# Patient Record
Sex: Male | Born: 1945 | Race: White | Hispanic: No | Marital: Married | Smoking: Former smoker
Health system: Southern US, Community
[De-identification: ages and names within clinical notes are randomized; demographics above are authoritative.]

## PROBLEM LIST (undated history)

## (undated) HISTORY — PX: COLONOSCOPY: SHX174

## (undated) HISTORY — PX: POLYPECTOMY: SHX149

---

## 1997-11-20 ENCOUNTER — Encounter: Admission: RE | Admit: 1997-11-20 | Discharge: 1997-11-20 | Payer: Self-pay | Admitting: Family Medicine

## 1998-01-07 ENCOUNTER — Encounter: Admission: RE | Admit: 1998-01-07 | Discharge: 1998-01-07 | Payer: Self-pay | Admitting: Family Medicine

## 1998-01-24 ENCOUNTER — Encounter: Payer: Self-pay | Admitting: Sports Medicine

## 1998-01-24 ENCOUNTER — Ambulatory Visit (HOSPITAL_COMMUNITY): Admission: RE | Admit: 1998-01-24 | Discharge: 1998-01-24 | Payer: Self-pay | Admitting: Sports Medicine

## 1998-11-11 ENCOUNTER — Encounter: Admission: RE | Admit: 1998-11-11 | Discharge: 1998-11-11 | Payer: Self-pay | Admitting: Sports Medicine

## 1999-10-13 ENCOUNTER — Encounter: Admission: RE | Admit: 1999-10-13 | Discharge: 1999-10-13 | Payer: Self-pay | Admitting: Sports Medicine

## 2000-07-05 ENCOUNTER — Encounter: Admission: RE | Admit: 2000-07-05 | Discharge: 2000-07-05 | Payer: Self-pay | Admitting: Sports Medicine

## 2000-09-13 ENCOUNTER — Ambulatory Visit (HOSPITAL_COMMUNITY): Admission: RE | Admit: 2000-09-13 | Discharge: 2000-09-13 | Payer: Self-pay | Admitting: *Deleted

## 2001-06-07 ENCOUNTER — Encounter: Admission: RE | Admit: 2001-06-07 | Discharge: 2001-06-07 | Payer: Self-pay | Admitting: Sports Medicine

## 2002-03-24 ENCOUNTER — Encounter: Admission: RE | Admit: 2002-03-24 | Discharge: 2002-03-24 | Payer: Self-pay | Admitting: Sports Medicine

## 2002-10-31 ENCOUNTER — Encounter: Admission: RE | Admit: 2002-10-31 | Discharge: 2002-10-31 | Payer: Self-pay | Admitting: Family Medicine

## 2003-07-17 ENCOUNTER — Encounter: Admission: RE | Admit: 2003-07-17 | Discharge: 2003-07-17 | Payer: Self-pay | Admitting: Family Medicine

## 2003-07-24 ENCOUNTER — Encounter: Admission: RE | Admit: 2003-07-24 | Discharge: 2003-07-24 | Payer: Self-pay | Admitting: Family Medicine

## 2005-04-28 ENCOUNTER — Ambulatory Visit: Payer: Self-pay | Admitting: Sports Medicine

## 2008-10-18 ENCOUNTER — Ambulatory Visit: Payer: Self-pay | Admitting: Sports Medicine

## 2008-10-18 DIAGNOSIS — M545 Low back pain, unspecified: Secondary | ICD-10-CM | POA: Insufficient documentation

## 2010-07-22 ENCOUNTER — Ambulatory Visit: Payer: Self-pay | Admitting: Sports Medicine

## 2011-11-24 ENCOUNTER — Ambulatory Visit (INDEPENDENT_AMBULATORY_CARE_PROVIDER_SITE_OTHER): Payer: Medicare Other | Admitting: Sports Medicine

## 2011-11-24 VITALS — BP 132/75 | Ht 70.0 in | Wt 160.0 lb

## 2011-11-24 DIAGNOSIS — M7700 Medial epicondylitis, unspecified elbow: Secondary | ICD-10-CM

## 2011-11-24 DIAGNOSIS — M25529 Pain in unspecified elbow: Secondary | ICD-10-CM | POA: Insufficient documentation

## 2011-11-24 DIAGNOSIS — M7711 Lateral epicondylitis, right elbow: Secondary | ICD-10-CM | POA: Insufficient documentation

## 2011-11-24 MED ORDER — MELOXICAM 15 MG PO TABS: 15.0000 mg | ORAL_TABLET | Freq: Every day | ORAL | Status: AC

## 2011-11-24 NOTE — Assessment & Plan Note (Signed)
Right side is moderate, Left is minimal.  Home exercise program prescribed and Ice massage.  Patient has tramadol at home to use as needed for pain.  He is having a colonoscopy next week- he understands not to take NSAIDS until his GI doctor approves- RX for meloxicam to start after colonoscopy when gastroenterologist feels it is safe.  Continue elbow straps. Follow up in 4 weeks if not improved.

## 2011-11-24 NOTE — Patient Instructions (Addendum)
Keep wearing your elbow brace.   Please try Ice massage over the area on your elbow that is sore.   Reverse wrist Curls: 5 lb weights, up slow, down fast, 3 sets of 10 Wrist Supination: 3-5 lb weights over fast, back slow 3 sets of 10 Biceps curls: Normal up, slow negative 15 lb weights, 3 sets of 10.  Triceps: max 10 lb weight, one arm at a time, 3 sets of 10

## 2011-11-24 NOTE — Progress Notes (Signed)
  Subjective:    Patient ID: Jason Stephenson, male    DOB: [birth year], 66 y.o.   MRN: [id number]  HPI  Jason Stephenson comes in for bilateral elbow pain.  He has been exercising, and was doing overhead triceps weights, with both hands, and moved up to 45 lbs.  He says that exercise was hard, but not painful.  He was also doing other exercises with cables that caused him some discomfort in his elbows.  He says he played golf, about 3 rounds in 10 days, and he started having sharp pain in his right>left elbows.  He had a medial epicondylitis years ago and had an elbow brace, and he got another and has been wearing them on both elbows.   Review of Systems See HPI    Objective:   Physical Exam BP 132/75  Ht 5\' 10"  (1.778 m)  Wt 160 lb (72.576 kg)  BMI 22.96 kg/m2 General appearance: alert, cooperative and no distress Elbow: Patient has tenderness to palpation on R>L medial epicondyle. Extensor tendon strength in tact without pain Flexor tendon strength is in tact, but patient has pain with strength testing R>L Triceps strength is 5/5 bilaterally  MSK ultrasound elbows There is a small amount of spurring at the insertion of the flexor tendons into the medial epicondyle of the right arm There is also some hypoechoic change No abnormal Doppler flow Left medial epicondyle there is some slight hypoechoic change but no tears of the tendon      Assessment & Plan:

## 2011-11-24 NOTE — Assessment & Plan Note (Signed)
Bilateral.  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

## 2011-12-01 ENCOUNTER — Ambulatory Visit: Payer: Self-pay | Admitting: Sports Medicine

## 2011-12-02 ENCOUNTER — Other Ambulatory Visit: Payer: Self-pay | Admitting: *Deleted

## 2011-12-02 DIAGNOSIS — Z8249 Family history of ischemic heart disease and other diseases of the circulatory system: Secondary | ICD-10-CM

## 2012-01-01 ENCOUNTER — Ambulatory Visit (HOSPITAL_COMMUNITY)
Admission: RE | Admit: 2012-01-01 | Discharge: 2012-01-01 | Disposition: A | Discharge status: Home / Self Care | Payer: Medicare Other | Source: Ambulatory Visit | Attending: Sports Medicine | Admitting: Sports Medicine

## 2012-01-01 ENCOUNTER — Encounter: Payer: Medicare Other | Admitting: Sports Medicine

## 2012-01-01 DIAGNOSIS — M549 Dorsalgia, unspecified: Secondary | ICD-10-CM | POA: Insufficient documentation

## 2012-01-01 DIAGNOSIS — I2510 Atherosclerotic heart disease of native coronary artery without angina pectoris: Secondary | ICD-10-CM | POA: Insufficient documentation

## 2012-01-01 DIAGNOSIS — Z8249 Family history of ischemic heart disease and other diseases of the circulatory system: Secondary | ICD-10-CM

## 2014-01-23 ENCOUNTER — Encounter: Payer: Self-pay | Admitting: Sports Medicine

## 2014-01-23 ENCOUNTER — Ambulatory Visit (INDEPENDENT_AMBULATORY_CARE_PROVIDER_SITE_OTHER): Payer: Medicare HMO | Admitting: Sports Medicine

## 2014-01-23 VITALS — BP 153/87 | HR 47 | Ht 69.0 in | Wt 149.0 lb

## 2014-01-23 DIAGNOSIS — S7000XA Contusion of unspecified hip, initial encounter: Secondary | ICD-10-CM

## 2014-01-23 DIAGNOSIS — M7062 Trochanteric bursitis, left hip: Secondary | ICD-10-CM

## 2014-01-23 DIAGNOSIS — S7002XA Contusion of left hip, initial encounter: Secondary | ICD-10-CM

## 2014-01-23 DIAGNOSIS — M76899 Other specified enthesopathies of unspecified lower limb, excluding foot: Secondary | ICD-10-CM | POA: Diagnosis not present

## 2014-01-23 MED ORDER — TRIAMCINOLONE ACETONIDE 10 MG/ML IJ SUSP
20.0000 mg | Freq: Once | INTRAMUSCULAR | Status: AC
  Administered 2014-01-23: 20 mg via INTRA_ARTICULAR

## 2014-01-23 NOTE — Patient Instructions (Signed)
Twice daily use ice pack and put pressure on this for 10 minutes  Use a pad over the hematoma and put on bike shorts Start lateral leg lifts - abduction - build up to 3 x 30 and then add weights Build weights at 3 sets of 15 start with 2 lbs When easy increase the weight  You have an organized hematoma as we saw on scan This won't aspirate as it is like rubber  Try this approach as it is an alternative to surgery  See me in 6 weeks

## 2014-01-23 NOTE — Assessment & Plan Note (Signed)
Procedure note Procedure:  Injection of left greater trochanteric bursa Consent obtained and verified. Time-out conducted. Noted no overlying erythema, induration, or other signs of local infection. Skin prepped in a sterile fashion. Topical analgesic spray: Ethyl chloride. Than  local skin wheal with 1% lidocaine 2 cc Completed without difficulty.  A 21-gauge needle was then used to puncture through the bursa which is thickened and fibrotic and is difficult to penetrate There is no free-flowing material for aspiration but we were able to inject with 2 cc of Kenalog 10 and 3 cc of lidocaine Meds: Pain immediately improved suggesting accurate placement of the medication. Advised to call if fevers/chills, erythema, induration, drainage, or persistent bleeding.   I advised him in terms of using Ice 2x daily and compression along with abduction exercises to see if we can get this consolidated hematoma to break up and resorb  Return in 6 weeks
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
## 2014-01-26 ENCOUNTER — Telehealth: Payer: Self-pay | Admitting: *Deleted

## 2014-01-26 NOTE — Telephone Encounter (Signed)
Message copied by Laurey Arrow on Fri Jan 26, 2014  3:14 PM ------      Message from: Carolyne Littles      Created: Fri Jan 26, 2014 11:34 AM      Regarding: questions      Contact: [phone]       Pt has questions regarding the patch that Dr. Oneida Alar put on his hip. Wants to make sure he is using correctly. ------

## 2014-03-06 ENCOUNTER — Encounter: Payer: Self-pay | Admitting: Sports Medicine

## 2014-03-06 ENCOUNTER — Ambulatory Visit (INDEPENDENT_AMBULATORY_CARE_PROVIDER_SITE_OTHER): Payer: Medicare HMO | Admitting: Sports Medicine

## 2014-03-06 VITALS — BP 132/78 | HR 53 | Ht 69.0 in | Wt 149.0 lb

## 2014-03-06 DIAGNOSIS — M7062 Trochanteric bursitis, left hip: Secondary | ICD-10-CM

## 2014-03-06 NOTE — Assessment & Plan Note (Addendum)
Seroma much improved from previous exam/visit and is much improved on [place]. - No needs for current intervention - Continue with hip abduction exercises  Use compression pants for any riding or running - F/U 36mos if not completley resolved
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
## 2014-03-06 NOTE — Patient Instructions (Addendum)
Please continue to perform the leg exercises including the hip raises on your side about 3 x per day. As well please start using the foam roller or foam ball 3-4 times per day as tolerates. We will see you back in 3 months  Thanks, Dr. Laurell Roof. Awanda Mink

## 2014-06-19 ENCOUNTER — Telehealth: Payer: Self-pay | Admitting: *Deleted

## 2014-06-19 NOTE — Telephone Encounter (Signed)
Pt said he has some meloxicam and wanted to know if this would be ok to take.  Told him it should be fine and we can see how it helps his pain, his appt is next week so we can follow up then.

## 2014-06-19 NOTE — Telephone Encounter (Signed)
-----   Message from Carolyne Littles sent at 06/19/2014 10:19 AM EST ----- Regarding: phone message Contact: [phone] Pt left message asking if he can take meloxicam for tendonitis of his elbow?

## 2014-06-28 ENCOUNTER — Other Ambulatory Visit: Payer: Self-pay | Admitting: Sports Medicine

## 2014-06-28 ENCOUNTER — Ambulatory Visit (INDEPENDENT_AMBULATORY_CARE_PROVIDER_SITE_OTHER): Payer: Medicare HMO | Admitting: Sports Medicine

## 2014-06-28 ENCOUNTER — Encounter: Payer: Self-pay | Admitting: Sports Medicine

## 2014-06-28 VITALS — BP 138/77 | HR 52 | Ht 69.0 in | Wt 160.0 lb

## 2014-06-28 DIAGNOSIS — M25521 Pain in right elbow: Secondary | ICD-10-CM

## 2014-06-28 DIAGNOSIS — M7712 Lateral epicondylitis, left elbow: Secondary | ICD-10-CM | POA: Insufficient documentation

## 2014-06-28 DIAGNOSIS — M7711 Lateral epicondylitis, right elbow: Secondary | ICD-10-CM | POA: Diagnosis not present

## 2014-06-28 DIAGNOSIS — M7062 Trochanteric bursitis, left hip: Secondary | ICD-10-CM

## 2014-06-28 NOTE — Assessment & Plan Note (Signed)
Right lateral epicondylitis - Discussed rehab exercises: Ball squeezes, wrist flexion and extension exercises, wearing brace while playing

## 2014-06-28 NOTE — Assessment & Plan Note (Signed)
Given HEP  Cont using tennis elbow strap  Reck and scan if not better

## 2014-06-28 NOTE — Progress Notes (Signed)
Subjective:   CC: Follow up left greater trochanter hematoma, right lateral elbow pain  HPI:   Follow up left hip hematoma above greater trochanter Present since fall in third week of April. Has been improving greatly since then. He has been doing hip abduction exercises. He has not had much tenderness at this area unless he bumps into something. He is icing it. He runs some and has been going hard on bike spinning machines at gym.   Right elbow pain Lateral elbow pain. Patient noticed it when he started shooting more basketball free throws.  Now hurts with golf  Review of Systems - Per HPI.   PMH: medial epicondylitis, low back pain, greater trochanteric bursitis left hip, elbow pain    Objective:  Physical Exam BP 138/77 mmHg  Pulse 52  Ht 5\' 9"  (1.753 m)  Wt 160 lb (72.576 kg)  BMI 23.62 kg/m2 GEN: NAD MSK:  Left hip full ROM Left hip abduction 5/5 Left greater trochanter with firm fullness that is nontender Mild healing scarring  R elbow TTP at tip lf lat epidcondyle Full ROM No swelling  MSK [place]:  3x1.5cm hypoechoic pocket with likely small clot in center, just above tensor fascia lata attachment to greater trochanter     Assessment:     Jason Stephenson is a 69 y.o. male here to follow up on left hip hematoma above greater trochanter.    Plan:     # See problem list and after visit summary for problem-specific plans.    Follow-up: Follow up PRN.   Hilton Sinclair, MD [facility]    Agree with assessment.  Edited.  Ila Mcgill, MD

## 2014-06-28 NOTE — Assessment & Plan Note (Addendum)
Hematoma is above left greater trochanter just above insertion of left tensor fascia lata. Mildly tender if bumps against something, otherwise asymptomatic with good hip ROM and abductor strength. Measures 3x1.5cm (mildly smaller than Oct). Risk of infection at this point is minimal. - Discussed that this continues to heal but will possibly always protrude slightly.  WE can rescan if sxs return - F/u PRN.

## 2014-10-31 ENCOUNTER — Encounter: Payer: Self-pay | Admitting: Sports Medicine

## 2014-10-31 ENCOUNTER — Ambulatory Visit (INDEPENDENT_AMBULATORY_CARE_PROVIDER_SITE_OTHER): Payer: Medicare HMO | Admitting: Sports Medicine

## 2014-10-31 VITALS — BP 141/61 | Ht 69.0 in | Wt 149.0 lb

## 2014-10-31 DIAGNOSIS — M7711 Lateral epicondylitis, right elbow: Secondary | ICD-10-CM | POA: Diagnosis not present

## 2014-10-31 MED ORDER — NITROGLYCERIN 0.2 MG/HR TD PT24: MEDICATED_PATCH | TRANSDERMAL | Status: DC

## 2014-10-31 NOTE — Progress Notes (Signed)
Patient ID: Jason Stephenson, male   DOB: 1945/05/28, 69 y.o.   MRN: [id number]  12 days ago did 28 mi bike ride Next day played golf Pain on lateral and post RT elbow (felt at 9 holes) Rested since then except swimming Taking Mobic and less pain now Still tender Hurts to lift anything Hurts - not at night or with simple ADLs  Gen Muscular W M NAD BP 141/61 mmHg  Ht 5\' 9"  (1.753 m)  Wt 149 lb (67.586 kg)  BMI 21.99 kg/m2  Elbow shows full ROM No effusion Only Mild TTP over lat epicondyle No TTP medial or post Good strength on wrist ext/ finger ext and hand grip Some pain with lifting trash can in elbow extension

## 2014-10-31 NOTE — Assessment & Plan Note (Signed)
MSK [place] Lateral elbow RT shows small split with hypoechoic swelling of common ext tendon Only mild edema No joint effusion   HEP as noted  NTG protocol  Elbow strap  Reck 6 wks

## 2014-10-31 NOTE — Patient Instructions (Signed)
Nitroglycerin Protocol   Apply 1/4 nitroglycerin patch to affected area daily.  Change position of patch within the affected area every 24 hours.  You may experience a headache during the first 1-2 weeks of using the patch, these should subside.  If you experience headaches after beginning nitroglycerin patch treatment, you may take your preferred over the counter pain reliever.  Another side effect of the nitroglycerin patch is skin irritation or rash related to patch adhesive.  Please notify our office if you develop more severe headaches or rash, and stop the patch.  Tendon healing with nitroglycerin patch may require 12 to 24 weeks depending on the extent of injury.  Men should not use if taking Viagra, Cialis, or Levitra.   Do not use if you have migraines or rosacea.    Do the exercises as you showed me Use the tennis elbow when active  When you can do 3 sets of 15 with 5 lbs you can play  Heat is better than ice for this/ Ice massage is OK for 3 mins after playing  Let's rescan in 6 weeks

## 2014-11-20 ENCOUNTER — Ambulatory Visit: Payer: Medicare HMO | Admitting: Sports Medicine

## 2014-12-12 ENCOUNTER — Ambulatory Visit (INDEPENDENT_AMBULATORY_CARE_PROVIDER_SITE_OTHER): Payer: Medicare HMO | Admitting: Sports Medicine

## 2014-12-12 ENCOUNTER — Encounter: Payer: Self-pay | Admitting: Sports Medicine

## 2014-12-12 VITALS — BP 135/64 | Ht 70.0 in | Wt 160.0 lb

## 2014-12-12 DIAGNOSIS — M7711 Lateral epicondylitis, right elbow: Secondary | ICD-10-CM | POA: Diagnosis not present

## 2014-12-12 NOTE — Assessment & Plan Note (Signed)
This is improved clincally Needs to cont HEP and NTG Cut NTG to half patch  Moderate some of heavier exercise - eg pushups  rescan in 6 weeks

## 2014-12-12 NOTE — Progress Notes (Signed)
Patient ID: Jason Stephenson, male   DOB: [birth year], 69 y.o.   MRN: [id number]  6 weeks after starting NTG for RT lat epicondylitis Much less pain Still playing golf May be dong too many rehab exercises as gets soreness after doing these at times Trying to do 40 push ups Nevertheless this has still improved  Using a whole patch w no side effects  Still gets some pain and aching after doing a lot  Exam Muscular older M in NAD BP 135/64 mmHg  Ht 5\' 10"  (1.778 m)  Wt 160 lb (72.576 kg)  BMI 22.96 kg/m2  No TTP over RT lat elbow Full ROM Neg book test Good strength and no pain on resistance testing of wrist extensors And finger extensors And supination  [place] Small split that measures 0.5 cm remains between 2 tendon bundle sof common extensor tendons Mild increase in doppler activity This may be slightly smaller than last scan

## 2014-12-31 ENCOUNTER — Telehealth: Payer: Self-pay | Admitting: *Deleted

## 2014-12-31 NOTE — Telephone Encounter (Signed)
-----   Message from Carolyne Littles sent at 12/31/2014 10:36 AM EDT ----- Regarding: question? Contact: [phone] Patients arm is improving and he is not having pain. He wants to know if Can he start cutting patching into a forth instead of a half as he has been? Please call to advise.

## 2014-12-31 NOTE — Telephone Encounter (Signed)
Spoke to pt and said it was ok for him to use 1/4 patch as needed for pain.

## 2015-01-23 ENCOUNTER — Encounter: Payer: Self-pay | Admitting: Sports Medicine

## 2015-01-23 ENCOUNTER — Ambulatory Visit (INDEPENDENT_AMBULATORY_CARE_PROVIDER_SITE_OTHER): Payer: Medicare HMO | Admitting: Sports Medicine

## 2015-01-23 VITALS — BP 139/71 | Ht 70.0 in | Wt 154.0 lb

## 2015-01-23 DIAGNOSIS — M7711 Lateral epicondylitis, right elbow: Secondary | ICD-10-CM | POA: Diagnosis not present

## 2015-01-23 NOTE — Patient Instructions (Signed)
-   Continue 1/4 patch of nitroglycerin for the next 3 months - ACE wrap following any exercises that use the arms - Try to resume doing exercises with 3 lb weights   - Resisted extension   - Rolling forearms back and forth - f/u in 3 months

## 2015-01-23 NOTE — Progress Notes (Signed)
  Jason Stephenson - 69 y.o. male MRN [id number]  Date of birth: 1946/02/04  CC: No chief complaint on file.   SUBJECTIVE:   HPI  12 weeks after starting NTG for RT lat epicondylitis  - started using 1/4 patch since last visit after using entire patch previously  - No side effects No pain, even if doing lots of activity Still playing golf, no pain No longer doing rehab exercises  Doing push ups, able to 30 now. Wants to do his age, but realizes that may not be the best idea.   ROS:     14 point RoS negative other than that listed in HPI  HISTORY: Past Medical, Surgical, Social, and Family History Reviewed & Updated per EMR.  Pertinent Historical Findings include: none  OBJECTIVE: BP 139/71 mmHg  Ht 5\' 10"  (1.778 m)  Wt 154 lb (69.854 kg)  BMI 22.10 kg/m2  Physical Exam  NAD, smiling Non-labored breathing  Non-TTP over RT lat elbow Full ROM Neg book test Good strength and no pain on resistance testing of wrist extensors, finger extensors, or supination.  [place]: lateral epicondyle, right Small split again visualized that measures 0.5 cm remains between 2 tendon bundle soft common extensor tendons (ECRL & ECRB). No increase in doppler activity.  This may be slightly smaller than last scan  MEDICATIONS, LABS & OTHER ORDERS: Previous Medications   MELOXICAM (MOBIC) 15 MG TABLET       MELOXICAM (MOBIC) 15 MG TABLET       NITROGLYCERIN (NITRODUR - DOSED IN MG/24 HR) 0.2 MG/HR PATCH    1/4 patch daily for 24 hours/ replace next day   NITROGLYCERIN (NITRODUR - DOSED IN MG/24 HR) 0.2 MG/HR PATCH    1/4 patch daily for 24 hours/ replace next day   Modified Medications   No medications on file   New Prescriptions   No medications on file   Discontinued Medications   No medications on file  No orders of the defined types were placed in this encounter.   ASSESSMENT & PLAN: See problem based charting & AVS for pt instructions.

## 2015-01-23 NOTE — Assessment & Plan Note (Signed)
Patient now doing quite well clinically. Still has separation of various muscles on u/s.  - Continue 1/4 patch of nitroglycerin for the next 3 months  - ACE wrap following any exercises that use the arms. ACE wrap provided today - Try to resume doing exercises with 3 lb weights including resisted extension and pronation/supination  - f/u in 3 months, sooner if needed.

## 2015-04-02 ENCOUNTER — Ambulatory Visit (INDEPENDENT_AMBULATORY_CARE_PROVIDER_SITE_OTHER): Payer: Medicare HMO | Admitting: Family Medicine

## 2015-04-02 ENCOUNTER — Encounter: Payer: Self-pay | Admitting: Family Medicine

## 2015-04-02 VITALS — BP 153/58 | HR 48 | Ht 70.0 in | Wt 154.0 lb

## 2015-04-02 DIAGNOSIS — M7711 Lateral epicondylitis, right elbow: Secondary | ICD-10-CM | POA: Diagnosis not present

## 2015-04-03 NOTE — Assessment & Plan Note (Signed)
Patient now asymptomatic for >3 months.  Several NTG patches left, which he will finish.  - Will continue to limit # of push ups to 40.   - Compression PRN - REsumed eccetrics -f/u PRN>

## 2015-04-03 NOTE — Progress Notes (Signed)
  Jason Stephenson - 69 y.o. male MRN [id number]  Date of birth: Aug 04, 1945  CC: b/l elbow pain  SUBJECTIVE:   HPI 20 weeks after starting NTG for RT lat epicondylitis. - using 1/4 patch daily - No pain for over 8 weeks. No pain, even if doing lots of activity including golf and push ups.  Has resumed rehab exercises.   Doing push ups, able to 40 now. Wants to do his age, but realizes that may not be the best idea so he is okay being able to do 40 twice a day.    He has pain with palpation over his left lat epi. No pain with activity. Thinks he may have bumped it on something, but can't really remember.   ROS:     14 point RoS negative other than that listed in HPI  HISTORY: Past Medical, Surgical, Social, and Family History Reviewed & Updated per EMR.    OBJECTIVE: BP 153/58 mmHg  Pulse 48  Ht 5\' 10"  (1.778 m)  Wt 154 lb (69.854 kg)  BMI 22.10 kg/m2  Physical Exam  Physical Exam  NAD, smiling Non-labored breathing  B/l Elbow: Non-TTP over RT lat elbow, but is tender on the left.  Full ROM Neg book test Good strength and no pain on resistance testing of wrist extensors, finger extensors, or supination.  [place]: lateral epicondyle, right Small split again visualized that measures 0.5 cm remains between 2 tendon bundle soft common extensor tendons (ECRL & ECRB). No increase in doppler activity. Similar to last scan. May be pathologic or simply chronic d/t his high activity level.   MEDICATIONS, LABS & OTHER ORDERS: Previous Medications   ASPIRIN EC 81 MG TABLET    Take 81 mg by mouth.   CHOLECALCIFEROL (VITAMIN D3) 1000 UNITS CAPS    Take by mouth.   MELOXICAM (MOBIC) 15 MG TABLET       MELOXICAM (MOBIC) 15 MG TABLET       NITROGLYCERIN (NITRODUR - DOSED IN MG/24 HR) 0.2 MG/HR PATCH    1/4 patch daily for 24 hours/ replace next day   NITROGLYCERIN (NITRODUR - DOSED IN MG/24 HR) 0.2 MG/HR PATCH    1/4 patch daily for 24 hours/ replace next day   Modified Medications   No medications on file   New Prescriptions   No medications on file   Discontinued Medications   No medications on file  No orders of the defined types were placed in this encounter.   ASSESSMENT & PLAN: See problem based charting & AVS for pt instructions.

## 2015-04-10 ENCOUNTER — Telehealth: Payer: Self-pay | Admitting: *Deleted

## 2015-04-10 NOTE — Telephone Encounter (Signed)
He wanted to see if the patch was ok to use in the opposite arm, if he needs more refills he will let us know.  His arm is feeling better

## 2015-04-10 NOTE — Telephone Encounter (Signed)
-----   Message from Carolyne Littles sent at 04/10/2015  3:48 PM EST ----- Pt is asking for call back regarding arm pain he is having after using nitro patches.

## 2015-05-06 ENCOUNTER — Encounter: Payer: Self-pay | Admitting: Sports Medicine

## 2015-05-06 ENCOUNTER — Ambulatory Visit (INDEPENDENT_AMBULATORY_CARE_PROVIDER_SITE_OTHER): Payer: Medicare HMO | Admitting: Sports Medicine

## 2015-05-06 VITALS — BP 130/60 | Ht 70.0 in | Wt 154.0 lb

## 2015-05-06 DIAGNOSIS — M7712 Lateral epicondylitis, left elbow: Secondary | ICD-10-CM

## 2015-05-06 MED ORDER — NITROGLYCERIN 0.2 MG/HR TD PT24: MEDICATED_PATCH | TRANSDERMAL | Status: DC

## 2015-05-06 NOTE — Progress Notes (Signed)
   Subjective:    Patient ID: Jason Stephenson, male    DOB: 11-05-1945, 69 y.o.   MRN: [id number]  HPI chief complaint: Left elbow pain  Patient comes in today complaining of 4-6 weeks of lateral left elbow pain. Pain began after he struck the lateral aspect of his elbow on a door frame. Since then he has had pain with certain activities especially any sort of activity that involves wrist extension. He has pain when holding any sort of heavy object. He localizes the pain to the lateral epicondyle. He has not noticed any swelling. No numbness or tingling. No medial elbow pain. He was successfully treated with topical nitroglycerin and a home exercise program recently for right elbow lateral epicondylitis. His symptoms do feel similar to his previous pain. He has tried some meloxicam but it has not been helpful. He has not tried nitroglycerin patches for the left elbow.  Interim medical history reviewed Medications reviewed Allergies reviewed    Review of Systems    as above Objective:   Physical Exam  Well-developed, fit appearing. No acute distress. Awake alert and oriented 3.  Left elbow: Full range of motion. No effusion. No soft tissue swelling. He is tender to palpation over the lateral epicondyle with reproducible pain with resisted wrist extension. No tenderness over the medial epicondyle. Good grip strength. Neurovascular intact distally.  MSK ultrasound of the left elbow was performed. Limited images through the lateral epicondyle are obtained. There is a split in the midportion of the common extensor tendon best seen on the [place]. Findings are consistent with partial tearing of the common extensor tendon of the left elbow.      Assessment & Plan:  Left elbow pain secondary to lateral epicondylitis  Ultrasound does show evidence of a partial tear. I want the patient to start the nitroglycerin protocol. He had excellent success with it previously for his right elbow. He  will resume his home exercises. I did review those with him in the office. I've also recommended daily icing. He can continue with those activities that do not cause him pain and he will follow-up with me in 4 weeks for repeat ultrasound.

## 2015-05-06 NOTE — Addendum Note (Signed)
Addended by: Cyd Silence on: 05/06/2015 01:45 PM   Modules accepted: Orders

## 2015-06-04 ENCOUNTER — Encounter: Payer: Self-pay | Admitting: Sports Medicine

## 2015-06-04 ENCOUNTER — Ambulatory Visit (INDEPENDENT_AMBULATORY_CARE_PROVIDER_SITE_OTHER): Payer: Medicare HMO | Admitting: Sports Medicine

## 2015-06-04 VITALS — BP 142/75 | Ht 70.0 in | Wt 154.0 lb

## 2015-06-04 DIAGNOSIS — M7712 Lateral epicondylitis, left elbow: Secondary | ICD-10-CM | POA: Diagnosis not present

## 2015-06-04 NOTE — Progress Notes (Signed)
   Subjective:    Patient ID: Jason Stephenson, male    DOB: Jul 12, 1945, 70 y.o.   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 Despite treatment with topical nitroglycerin and home exercises over the past month, his symptoms persist. He does admit that he is able to do most of his workouts including pushups and resistance training without any pain at the time. His pain is intermittent and not associated with any particular activity. It is localized over the lateral epicondyle. Previous ultrasound showed 2 discrete hypoechoic areas in the common extensor tendon consistent with partial tearing.    Review of Systems    as above Objective:   Physical Exam  Well-developed, well-nourished. No acute distress. Awake alert and oriented 3. Vital signs reviewed  Left elbow: Full range of motion. No effusion. No soft tissue swelling. Patient is tender to palpation over the lateral epicondyles with reproducible pain with ECRB testing. Good grip strength. No tenderness over the radial tunnel. Neurovascularly intact distally.  MSK ultrasound of the left elbow was repeated today. Limited images of the lateral elbow were obtained and compared to previous images. The hypoechoic changes seen on his prior scan have almost resolved. There is still a slight area of hypoechogenicity in the mid substance of the tendon but it is improved and not as prominent as his initial scan. Findings are consistent with healing common extensor tendon tears.      Assessment & Plan:  Left elbow pain secondary to lateral epicondylitis/common extensor tendon tearing  Patient is reassured that his ultrasound shows good healing with the nitroglycerin patches. Despite this, he continues to have pain, although he is able to stay quite active. He has purchased a compression sleeve which he has found helpful. I want him to continue with  the nitroglycerin patches and I will send him for some formal physical therapy. Patient will return to the office in 4 weeks for reevaluation. I've explained to him that these types of injuries take several weeks if not months before resolving. Call with questions or concerns prior to his follow-up visit.

## 2015-07-08 ENCOUNTER — Ambulatory Visit: Payer: Medicare HMO | Admitting: Sports Medicine

## 2015-07-23 ENCOUNTER — Encounter: Payer: Self-pay | Admitting: Sports Medicine

## 2015-07-23 ENCOUNTER — Ambulatory Visit (INDEPENDENT_AMBULATORY_CARE_PROVIDER_SITE_OTHER): Payer: Medicare HMO | Admitting: Sports Medicine

## 2015-07-23 VITALS — BP 136/56 | Ht 70.0 in | Wt 160.0 lb

## 2015-07-23 DIAGNOSIS — M7712 Lateral epicondylitis, left elbow: Secondary | ICD-10-CM

## 2015-07-23 NOTE — Progress Notes (Signed)
   Subjective:    Patient ID: Jason Stephenson, male    DOB: 11-23-1945, 70 y.o.   MRN: [id number]  HPI   Patient comes in today for follow-up on left elbow lateral epicondylitis. He is still having some pain but it does not sound like it is interfering with his activity level. Pain is most noticeable if he picks up a heavy object or if he bumps his elbow against something hard. Otherwise, he has been able to exercise, including golf, without any problem. He has been using topical nitroglycerin patches for the past 10 weeks. He has also been doing physical therapy. He is a little bit frustrated that he is still having pain. No numbness or tingling.    Review of Systems     Objective:   Physical Exam  Well-developed, fit appearing. No acute distress.  Left elbow: Full range of motion. No effusion. No soft tissue swelling. He is tender to palpation directly over the lateral epicondyles and does have reproducible pain (mild pain) with resisted middle finger extension. No significant pain with resisted wrist extension. Good grip strength. Neurovascularly intact distally.  MSK ultrasound of the lateral left elbow was obtained. The previous hypoechoic changes seen on his prior scans have now resolved. There is a small traction spur off of the lateral epicondyle but the previous tears appear to have healed.      Assessment & Plan:   Left elbow pain secondary to lateral epicondylitis  I reassured the patient that his ultrasound shows good objective evidence of healing. Although he is becoming a little frustrated with his pain I've encouraged him to continue with conservative treatment, specifically to continue with topical nitroglycerin and physical therapy. He'll follow-up with me in 4-6 weeks. If symptoms do not continue to improve, or if they worsen, then I would consider further diagnostic imaging.

## 2015-08-27 ENCOUNTER — Encounter: Payer: Self-pay | Admitting: Sports Medicine

## 2015-08-27 ENCOUNTER — Ambulatory Visit (INDEPENDENT_AMBULATORY_CARE_PROVIDER_SITE_OTHER): Payer: Medicare HMO | Admitting: Sports Medicine

## 2015-08-27 VITALS — BP 139/61 | Ht 70.0 in | Wt 160.0 lb

## 2015-08-27 DIAGNOSIS — M7712 Lateral epicondylitis, left elbow: Secondary | ICD-10-CM

## 2015-08-27 MED ORDER — NITROGLYCERIN 0.2 MG/HR TD PT24: MEDICATED_PATCH | TRANSDERMAL | Status: DC

## 2015-08-27 NOTE — Progress Notes (Signed)
    HPI   Patient comes in today for follow-up on left elbow lateral epicondylitis. He is still having mild pain with certain movements but it is overall the best it has been He has been able to exercise, including golf, without any problem. He has been using topical nitroglycerin patches for the past 16 weeks. He has also been doing physical therapy which he saw yesterday. No numbness or tingling. Hopeful the tears have healed on ultrasound, wants to keep using patches as long as they are beneficial.  Soc Hx : widowed in past 2 years  ROS Gets depressed at times Denies neck pain or radiation of sxs to shoulder and arm and hand      Objective:    Physical Exam  BP 139/61 mmHg  Ht 5\' 10"  (1.778 m)  Wt 160 lb (72.576 kg)  BMI 22.96 kg/m2  Well-developed, fit appearing. No acute distress.  Left elbow: Full range of motion. No effusion. No soft tissue swelling. Only mild TTP directly over the lateral epicondyle and proximal extensors, but no longer has reproducible pain with resisted middle finger extension. No significant pain with resisted wrist extension. Good grip strength. Full strength and no pain with book test. Neurovascularly intact distally.  MSK ultrasound of the lateral left elbow was obtained. The previous hypoechoic changes seen on his prior scans have now resolved, though there remains only one area of hypoechoic change which has not yet healed. Minor calcifications in tendon noted.  Doppler flow seems normal.     Assessment & Plan:    Left elbow pain secondary to lateral epicondylitis  I reassured the patient that his ultrasound shows good objective evidence of healing. Although he is becoming a little frustrated with his pain I've encouraged him to continue with conservative treatment, specifically to continue with topical nitroglycerin and physical therapy. He'll follow-up with me in 4-6 weeks. If symptoms do not continue to improve, or if they worsen, then I would  consider further diagnostic imaging.

## 2015-08-27 NOTE — Assessment & Plan Note (Signed)
Much better  Keep up HEP at least 2x week  Can wean off NTG  Reck prn

## 2015-09-02 ENCOUNTER — Other Ambulatory Visit: Payer: Self-pay | Admitting: *Deleted

## 2015-09-02 MED ORDER — MELOXICAM 15 MG PO TABS: 15.0000 mg | ORAL_TABLET | Freq: Every day | ORAL | Status: DC

## 2015-10-22 ENCOUNTER — Ambulatory Visit (INDEPENDENT_AMBULATORY_CARE_PROVIDER_SITE_OTHER): Payer: Medicare HMO | Admitting: Sports Medicine

## 2015-10-22 ENCOUNTER — Encounter: Payer: Self-pay | Admitting: Sports Medicine

## 2015-10-22 VITALS — BP 134/59 | HR 50 | Ht 70.0 in | Wt 160.0 lb

## 2015-10-22 DIAGNOSIS — M7712 Lateral epicondylitis, left elbow: Secondary | ICD-10-CM | POA: Diagnosis not present

## 2015-10-22 DIAGNOSIS — S76312A Strain of muscle, fascia and tendon of the posterior muscle group at thigh level, left thigh, initial encounter: Secondary | ICD-10-CM | POA: Diagnosis not present

## 2015-10-22 DIAGNOSIS — S76319A Strain of muscle, fascia and tendon of the posterior muscle group at thigh level, unspecified thigh, initial encounter: Secondary | ICD-10-CM | POA: Insufficient documentation

## 2015-10-22 NOTE — Progress Notes (Signed)
Patient ID: Jason Stephenson, male   DOB: 12-22-1945, 70 y.o.   MRN: [id number]  Epicondylitis that started 6 months ago He had a good response to nitroglycerin He is now able to do pull-ups with 55 pounds He can do pushups He states that his pain is intermittently presently wanted it rechecked However, the pain is not limiting him from much activity at this point and is usually 2/10 or lower  Left hamstring pull in senior games recently Using compression sleeve and feeling better Some slight bruising about 2 days after the pull Wants additional exercises to start  Past history He has a long-standing problem with BPH Just started on Tamulosin He will probably go on daily low-dose Cialis  Social history 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 Wife died in the past couple years and he has started dating again  Review of systems No numbness in hands or forearm No weakness noted with regular activities No joint swelling  Physical examination Muscular male in no acute distress BP 134/59 mmHg  Pulse 50  Ht 5\' 10"  (1.778 m)  Wt 160 lb (72.576 kg)  BMI 22.96 kg/m2  Left elbow shows no point tenderness Range of motion is normal Strength testing feel slightly in the lateral epicondyle Grip strength is good No swelling or redness noted  Ultrasound Compared to previous scans almost all of the hypoechoic change that appear to be small tears have resolved Spurring noted before has gotten much smaller No increased Doppler activity

## 2015-10-22 NOTE — Assessment & Plan Note (Signed)
Strength is improved and he is walking normally  He is compression sleeve for 3 months  Start diver and extender exercises  Gradually ease into walk/ run  RECK prn

## 2015-10-22 NOTE — Assessment & Plan Note (Signed)
Stop nitroglycerin and did not use the Cialis A few grip and forearm exercises  I think he can continue doing anything he wishes  while may have some symptoms this has clinically and by ultrasound healed

## 2015-10-22 NOTE — Patient Instructions (Signed)
Your elbow looks very good Spur is smaller One tear completely healed and the other very small  Stop nitroglycerin patches (don't use with cialis)  Start arnica gel two to 3 times per day  Add some ball squeezes - tennis ball or softer Add some rolling of a stick or bar  This is likely to be a little bit more sensitive but I think has healed enough to do anything you wish  For hamstring - try two exercises Diver - 4 times a week with 4 sets of 8 Extender - 4 times a week with 4 sets of 15  See me as needed

## 2017-03-09 ENCOUNTER — Ambulatory Visit (INDEPENDENT_AMBULATORY_CARE_PROVIDER_SITE_OTHER): Payer: Medicare HMO | Admitting: Sports Medicine

## 2017-03-09 DIAGNOSIS — G609 Hereditary and idiopathic neuropathy, unspecified: Secondary | ICD-10-CM | POA: Insufficient documentation

## 2017-03-09 DIAGNOSIS — E213 Hyperparathyroidism, unspecified: Secondary | ICD-10-CM

## 2017-03-09 NOTE — Progress Notes (Signed)
CC: Bilat. Foot pain  Patient has a long history of bilateral foot pain He has a loss of the longitudinal arch bilaterally He has had very little padding and his foot pad for number of years We have been able to deal with this with good shoes until one year ago when he began getting numbness on the plantar surface of both feet Currently he still feels this numbness and the feet feel tired  Not much pain in either foot until after the end of the day Then at times he'll get aching in the bottom of both feet  Past history Recent diagnosis of hyperparathyroidism PTH level was twice normal Calcium was in the 11 range He had one enlarged adenoma  Review of systems Denies foot swelling Denies numbness extending above the feet or into the ankles Denies low back pain  Physical examination Muscular older male in no acute distress Speech seems somewhat circumferential and at times he admits to forgetting details BP (!) 148/82   Ht 5\' 9"  (1.753 m)   Wt 156 lb (70.8 kg)   BMI 23.04 kg/m   Feet bilaterally show pes planus Morton's foot change bilaterally Callus over the forefoot No palpable tenderness over the metatarsals No palpable tenderness over the plantar fascia Loss of sensation primarily along the medial plantar surface of both feet No sensory loss over either ankle

## 2017-03-09 NOTE — Assessment & Plan Note (Signed)
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

## 2017-03-09 NOTE — Patient Instructions (Signed)
Hyperparathyroidism You have positive tests for this I think the surgery is a reasonable option  Start taking Vitamn B6 100 mgm per day You can get this at any drug store  Use the sports inserts This is to take pressure off nerves of foot which are leading to numbness  The Vitamin D makes sense as well  See me as needed

## 2017-03-09 NOTE — Assessment & Plan Note (Signed)
I gave him sports insoles today with scaphoid pad support These were very comfortable for him in walking He was able to run with no pain and is form was good  We plan to try those for at least a month to see if there is an element of tarsal tunnel syndrome  Vitamin B6 100 mg daily

## 2017-04-21 HISTORY — PX: OTHER SURGICAL HISTORY: SHX169

## 2017-05-31 ENCOUNTER — Encounter: Payer: Self-pay | Admitting: Gastroenterology

## 2017-05-31 ENCOUNTER — Telehealth: Payer: Self-pay | Admitting: Gastroenterology

## 2017-05-31 NOTE — Telephone Encounter (Signed)
Received Colon/Path report. Patient states that he is due for another colonoscopy and is requesting to see Dr. Fuller Plan. Records placed on his desk for review.

## 2017-05-31 NOTE — Telephone Encounter (Signed)
Dr. Fuller Plan reviewed records and has accepted patient. Ok to schedule Direct Colon. Patient states that he will callback to schedule

## 2017-06-24 ENCOUNTER — Ambulatory Visit (AMBULATORY_SURGERY_CENTER): Payer: Self-pay | Admitting: *Deleted

## 2017-06-24 ENCOUNTER — Other Ambulatory Visit: Payer: Self-pay

## 2017-06-24 ENCOUNTER — Encounter: Payer: Self-pay | Admitting: Gastroenterology

## 2017-06-24 ENCOUNTER — Telehealth: Payer: Self-pay | Admitting: Gastroenterology

## 2017-06-24 VITALS — Ht 69.5 in | Wt 160.0 lb

## 2017-06-24 DIAGNOSIS — Z86010 Personal history of colonic polyps: Secondary | ICD-10-CM

## 2017-06-24 MED ORDER — NA SULFATE-K SULFATE-MG SULF 17.5-3.13-1.6 GM/177ML PO SOLN
1.0000 | Freq: Once | ORAL | 0 refills | Status: DC
Start: 2017-06-24 — End: 2017-06-24

## 2017-06-24 MED ORDER — NA SULFATE-K SULFATE-MG SULF 17.5-3.13-1.6 GM/177ML PO SOLN: 1.0000 | Freq: Once | ORAL | 0 refills | Status: AC

## 2017-06-24 NOTE — Telephone Encounter (Signed)
Patient states he has some remaining questions for nurse only and he needs to speak with the nurse he was with this morning to discuss. No further information.

## 2017-06-24 NOTE — Telephone Encounter (Signed)
Patient is requesting that prep be called in to [facility] on [address]. He also has additional questions. Best # [phone]

## 2017-06-24 NOTE — Telephone Encounter (Signed)
Pt checking where prep was sent- informed [facility] I called the [employer] in [place] and cancelled the one I accidentally sent there  Pt asked if the 5 days before the can eat ice cream- informed yes but no ice cream Tuesday or Wed - pt verbalized understanding  Lelan Pons PV

## 2017-06-24 NOTE — Progress Notes (Signed)
No egg or soy allergy known to patient  No issues with past sedation with any surgeries  or procedures, no intubation problems  No diet pills per patient No home 02 use per patient  No blood thinners per patient  Pt denies issues with constipation  No A fib or A flutter  EMMI video sent to pt's e mail - pt declined  I went over instructions with Pt MULTIPLE times in PV today - he was concerned about the no eating raw veggies the 5 days before the procedure- I had to tell him sev.times spinach and tomatoes that are not cooked was considered raw- he kept stating this was very confusing to him- I wrote down foods he could eat - we discussed how to drink prep - he kept saying 24 ounces of prep twice  and I explained 16 ounces prep followed by 2-16 oz water 6 pm night before and 3 am morning of procedure and we went over this and over this - im not sure he will follow instrutions as directed - over 1 hour spent with this pt in PV today

## 2017-07-07 ENCOUNTER — Encounter: Payer: Self-pay | Admitting: Gastroenterology

## 2017-07-07 ENCOUNTER — Other Ambulatory Visit: Payer: Self-pay

## 2017-07-07 ENCOUNTER — Ambulatory Visit (AMBULATORY_SURGERY_CENTER): Payer: Medicare HMO | Admitting: Gastroenterology

## 2017-07-07 VITALS — BP 128/74 | HR 48 | Temp 97.1°F | Resp 13 | Ht 69.5 in | Wt 160.0 lb

## 2017-07-07 DIAGNOSIS — D123 Benign neoplasm of transverse colon: Secondary | ICD-10-CM | POA: Diagnosis not present

## 2017-07-07 DIAGNOSIS — Z86010 Personal history of colonic polyps: Secondary | ICD-10-CM

## 2017-07-07 MED ORDER — SODIUM CHLORIDE 0.9 % IV SOLN
500.0000 mL | Freq: Once | INTRAVENOUS | Status: DC
Start: 2017-07-07 — End: 2021-10-14

## 2017-07-07 NOTE — Patient Instructions (Signed)
YOU HAD AN ENDOSCOPIC PROCEDURE TODAY AT [facility]:   Refer to the procedure report that was given to you for any specific questions about what was found during the examination.  If the procedure report does not answer your questions, please call your gastroenterologist to clarify.  If you requested that your care partner not be given the details of your procedure findings, then the procedure report has been included in a sealed envelope for you to review at your convenience later.  YOU SHOULD EXPECT: Some feelings of bloating in the abdomen. Passage of more gas than usual.  Walking can help get rid of the air that was put into your GI tract during the procedure and reduce the bloating. If you had a lower endoscopy (such as a colonoscopy or flexible sigmoidoscopy) you may notice spotting of blood in your stool or on the toilet paper. If you underwent a bowel prep for your procedure, you may not have a normal bowel movement for a few days.  Please Note:  You might notice some irritation and congestion in your nose or some drainage.  This is from the oxygen used during your procedure.  There is no need for concern and it should clear up in a day or so.  SYMPTOMS TO REPORT IMMEDIATELY:   Following lower endoscopy (colonoscopy or flexible sigmoidoscopy):  Excessive amounts of blood in the stool  Significant tenderness or worsening of abdominal pains  Swelling of the abdomen that is new, acute  Fever of 100F or higher  For urgent or emergent issues, a gastroenterologist can be reached at any hour by calling [phone].   DIET:  We do recommend a small meal at first, but then you may proceed to your regular diet.  Drink plenty of fluids but you should avoid alcoholic beverages for 24 hours. Follow a High Fiber Diet (see handout given to you by your recovery nurse).  MEDICATIONS: Continue present medications.  Please see handouts given to you by your recovery  nurse.  ACTIVITY:  You should plan to take it easy for the rest of today and you should NOT DRIVE or use heavy machinery until tomorrow (because of the sedation medicines used during the test).    FOLLOW UP: Our staff will call the number listed on your records the next business day following your procedure to check on you and address any questions or concerns that you may have regarding the information given to you following your procedure. If we do not reach you, we will leave a message.  However, if you are feeling well and you are not experiencing any problems, there is no need to return our call.  We will assume that you have returned to your regular daily activities without incident.  If any biopsies were taken you will be contacted by phone or by letter within the next 1-3 weeks.  Please call us at [phone] if you have not heard about the biopsies in 3 weeks.   Thank you for allowing [place] to provide for your healthcare needs today.   SIGNATURES/CONFIDENTIALITY: You and/or your care partner have signed paperwork which will be entered into your electronic medical record.  These signatures attest to the fact that that the information above on your After Visit Summary has been reviewed and is understood.  Full responsibility of the confidentiality of this discharge information lies with you and/or your care-partner.

## 2017-07-07 NOTE — Progress Notes (Signed)
Pt's states no medical or surgical changes since previsit or office visit. 

## 2017-07-07 NOTE — Progress Notes (Signed)
Called to room to assist during endoscopic procedure.  Patient ID and intended procedure confirmed with present staff. Received instructions for my participation in the procedure from the performing physician.  

## 2017-07-07 NOTE — Progress Notes (Signed)
Report given to PACU, vss 

## 2017-07-07 NOTE — Op Note (Signed)
[facility] Patient Name: Jason Stephenson Procedure Date: 07/07/2017 8:11 AM MRN: [id number] Endoscopist: Ladene Artist , MD Age: 72 Referring MD:  Date of Birth: 23-Jul-1945 Gender: Male Account #: [id number] Procedure:                Colonoscopy Indications:              Surveillance: Personal history of adenomatous                            polyps on last colonoscopy 5 years ago Medicines:                Monitored Anesthesia Care Procedure:                Pre-Anesthesia Assessment:                           - Prior to the procedure, a History and Physical                            was performed, and patient medications and                            allergies were reviewed. The patient's tolerance of                            previous anesthesia was also reviewed. The risks                            and benefits of the procedure and the sedation                            options and risks were discussed with the patient.                            All questions were answered, and informed consent                            was obtained. Prior Anticoagulants: The patient has                            taken no previous anticoagulant or antiplatelet                            agents. ASA Grade Assessment: II - A patient with                            mild systemic disease. After reviewing the risks                            and benefits, the patient was deemed in                            satisfactory condition to undergo the procedure.  After obtaining informed consent, the colonoscope                            was passed under direct vision. Throughout the                            procedure, the patient's blood pressure, pulse, and                            oxygen saturations were monitored continuously. The                            Model PCF-[id number]) scope was introduced                            through the anus and  advanced to the the cecum,                            identified by appendiceal orifice and ileocecal                            valve. The ileocecal valve, appendiceal orifice,                            and rectum were photographed. The quality of the                            bowel preparation was adequate after extensive                            lavage and suctioning. The colonoscopy was                            performed without difficulty. The patient tolerated                            the procedure well. Scope In: 8:18:09 AM Scope Out: 8:34:06 AM Scope Withdrawal Time: 0 hours 12 minutes 57 seconds  Total Procedure Duration: 0 hours 15 minutes 57 seconds  Findings:                 The perianal and digital rectal examinations were                            normal.                           A single small localized angiodysplastic lesion                            without bleeding was found in the cecum.                           A 7 mm polyp was found in the transverse colon. The  polyp was sessile. The polyp was removed with a                            cold snare. Resection and retrieval were complete.                           Many medium-mouthed diverticula were found in the                            left colon. There was no evidence of diverticular                            bleeding.                           Internal hemorrhoids were found during                            retroflexion. The hemorrhoids were small and Grade                            I (internal hemorrhoids that do not prolapse).                           The exam was otherwise without abnormality on                            direct and retroflexion views. Complications:            No immediate complications. Estimated blood loss:                            None. Estimated Blood Loss:     Estimated blood loss: none. Impression:               - One 7 mm polyp in the  transverse colon, removed                            with a cold snare. Resected and retrieved.                           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 There                            was no evidence of diverticular bleeding.                           - Internal hemorrhoids.                           - The examination was otherwise normal on direct  and retroflexion views. Recommendation:           - Repeat colonoscopy in 5 years for surveillance                            with a more extensive bowel prep.                           - Patient has a contact number available for                            emergencies. The signs and symptoms of potential                            delayed complications were discussed with the                            patient. Return to normal activities tomorrow.                            Written discharge instructions were provided to the                            patient.                           - High fiber diet.                           - Continue present medications.                           - Await pathology results. Ladene Artist, MD 07/07/2017 8:38:54 AM This report has been signed electronically.

## 2017-07-08 ENCOUNTER — Telehealth: Payer: Self-pay

## 2017-07-08 NOTE — Telephone Encounter (Signed)
  Follow up Call-  Call Gautam Langhorst number 07/07/2017  Post procedure Call Jakub Debold phone  # [phone]  Permission to leave phone message Yes  Some recent data might be hidden     Patient questions:  Do you have a fever, pain , or abdominal swelling? No. Pain Score  0 *  Have you tolerated food without any problems? Yes.    Have you been able to return to your normal activities? Yes.    Do you have any questions about your discharge instructions: Diet   No. Medications  No. Follow up visit  No.  Do you have questions or concerns about your Care? No.  Actions: * If pain score is 4 or above: No action needed, pain <4.

## 2017-07-17 ENCOUNTER — Encounter: Payer: Self-pay | Admitting: Gastroenterology

## 2018-01-26 ENCOUNTER — Ambulatory Visit: Payer: Medicare HMO | Admitting: Sports Medicine

## 2018-01-26 VITALS — BP 124/72 | Ht 70.0 in | Wt 160.0 lb

## 2018-01-26 DIAGNOSIS — M25561 Pain in right knee: Secondary | ICD-10-CM | POA: Diagnosis not present

## 2018-01-26 NOTE — Patient Instructions (Signed)
Thank you, to see [place] in clinic today. He was seen today for follow-up of your right knee pain.  We believe this is likely secondary to either bursitis or degenerative meniscal injury in her right knee.  Should be wearing her compression sleeve and doing activity.  He can continue with exercises and running as tolerated by pain.  We will see her back for follow-up in 6 weeks.

## 2018-01-26 NOTE — Progress Notes (Signed)
   HPI  CC: Right knee pain  [name] is a 72 year old male who presents for follow-up of right knee pain.  He was seen by Percell Miller went to urgent care on Saturday for a similar issue and was given a corticosteroid knee injection.  He states he did have some relief following this.  He states his pain is on the medial aspect of his right knee.  He said he noticed it after he was doing a butterfly stretch at his gym around 7 days ago.  He states the pain lingered despite icing and ibuprofen use.He denies any trauma or inciting incident.  He says he does not have pain in this knee prior to this.   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  He denies any weakness in the leg.  He denies any locking catching in the knee.  He denies any pain going up and down stairs.  He states the pain is made worse when he puts His knee in flexion and internally rotates it.  It does not seem to affect his running.  See HPI and/or previous note for associated ROS.  Objective: BP 124/72   Ht 5\' 10"  (1.778 m)   Wt 160 lb (72.6 kg)   BMI 22.96 kg/m  Gen: Right-Hand Dominant. NAD, well groomed, a/o x3, normal affect.  CV: Well-perfused. Warm.  Resp: Non-labored.  Neuro: Sensation intact throughout. No gross coordination deficits.  Gait: Nonpathologic posture, unremarkable stride without signs of limp or balance issues.  Right knee exam: No swelling, erythema, warmth noted.  No tenderness to palpation appreciated.  Full range of motion of flexion extension  Of the knee.  Strength 5 out of 5 throughout testing.  Negative Lachman, negative posterior drawer, negative valgus stress testing, negative varus stress testing, negative McMurray testing, and negative thessalyTest.  Assessment and plan:  Right medial knee pain, likely secondary to peds anserine bursitis versus Degenerative medial meniscal tear.  [name] pain seems to resolve with  conservative measures with steroid injection at that visit  With urgent care on Saturday.  I suspect that he has a degenerative meniscal tear in his right knee versus the peds anserine bursitis.  Given the mechanism of injury with a butterfly stretch, I am more inclined to believe this is a pes anserine bursitis.  I advised him to continue using his knee sleeve that he previously had.  We have provided him with some quad isometric stretches at today's visit.  He is okay to compete in his event next week, as long as he has no pain.  We will see him back on an as-needed basis if the pain gets worse or returns.  Lewanda Rife, MD [facility] 01/26/2018 2:18 PM

## 2018-05-27 ENCOUNTER — Other Ambulatory Visit: Payer: Self-pay | Admitting: Internal Medicine

## 2018-05-27 DIAGNOSIS — G3184 Mild cognitive impairment, so stated: Secondary | ICD-10-CM

## 2018-05-30 ENCOUNTER — Ambulatory Visit
Admission: RE | Admit: 2018-05-30 | Discharge: 2018-05-30 | Disposition: A | Discharge status: Home / Self Care | Payer: Medicare HMO | Source: Ambulatory Visit | Attending: Internal Medicine | Admitting: Internal Medicine

## 2018-05-30 DIAGNOSIS — G3184 Mild cognitive impairment, so stated: Secondary | ICD-10-CM

## 2018-05-30 MED ORDER — GADOBENATE DIMEGLUMINE 529 MG/ML IV SOLN
15.0000 mL | Freq: Once | INTRAVENOUS | Status: AC | PRN
  Administered 2018-05-30: 15 mL via INTRAVENOUS

## 2018-06-25 ENCOUNTER — Emergency Department (HOSPITAL_COMMUNITY)
Admission: EM | Admit: 2018-06-25 | Discharge: 2018-06-25 | Disposition: A | Discharge status: Home / Self Care | Payer: Medicare HMO | Attending: Emergency Medicine | Admitting: Emergency Medicine

## 2018-06-25 DIAGNOSIS — Y929 Unspecified place or not applicable: Secondary | ICD-10-CM | POA: Diagnosis not present

## 2018-06-25 DIAGNOSIS — Y939 Activity, unspecified: Secondary | ICD-10-CM | POA: Diagnosis not present

## 2018-06-25 DIAGNOSIS — Z87891 Personal history of nicotine dependence: Secondary | ICD-10-CM | POA: Insufficient documentation

## 2018-06-25 DIAGNOSIS — Z79899 Other long term (current) drug therapy: Secondary | ICD-10-CM | POA: Diagnosis not present

## 2018-06-25 DIAGNOSIS — Y999 Unspecified external cause status: Secondary | ICD-10-CM | POA: Diagnosis not present

## 2018-06-25 DIAGNOSIS — S0003XA Contusion of scalp, initial encounter: Secondary | ICD-10-CM | POA: Diagnosis present
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
## 2018-06-25 NOTE — Discharge Instructions (Signed)
Take tylenol, motrin for headaches.   Apply ice to hematoma   See your doctor  Return to ER if you have worse headaches, vomiting, lethargy.

## 2019-05-22 ENCOUNTER — Ambulatory Visit: Payer: Medicare HMO | Attending: Internal Medicine

## 2019-05-22 DIAGNOSIS — Z20822 Contact with and (suspected) exposure to covid-19: Secondary | ICD-10-CM

## 2019-05-23 LAB — NOVEL CORONAVIRUS, NAA: SARS-CoV-2, NAA: NOT DETECTED

## 2019-05-24 ENCOUNTER — Telehealth: Payer: Self-pay

## 2019-05-24 NOTE — Telephone Encounter (Signed)
Pt notified of negative COVID-19 results. Understanding verbalized.  Chasta M Hopkins   

## 2019-12-07 ENCOUNTER — Encounter: Payer: Self-pay | Admitting: Neurology

## 2020-03-15 ENCOUNTER — Encounter: Payer: Self-pay | Admitting: Neurology

## 2020-03-15 ENCOUNTER — Other Ambulatory Visit: Payer: Self-pay

## 2020-03-15 ENCOUNTER — Ambulatory Visit: Payer: Medicare HMO | Admitting: Neurology

## 2020-03-15 VITALS — BP 192/79 | HR 72 | Resp 20 | Ht 70.0 in | Wt 165.0 lb

## 2020-03-15 DIAGNOSIS — F0390 Unspecified dementia without behavioral disturbance: Secondary | ICD-10-CM | POA: Diagnosis not present

## 2020-03-15 DIAGNOSIS — F03A0 Unspecified dementia, mild, without behavioral disturbance, psychotic disturbance, mood disturbance, and anxiety: Secondary | ICD-10-CM

## 2020-03-15 MED ORDER — MEMANTINE HCL 10 MG PO TABS: ORAL_TABLET | ORAL | 11 refills | Status: DC

## 2020-03-15 NOTE — Patient Instructions (Signed)
1. Start Memantine (Namenda) 10mg : Take 1 tablet every night for 2 weeks, then increase to 1 tablet twice a day  2. Continue Donepezil 10mg  daily  3. Referral will be sent to Speech Therapy to help with the word-finding difficulties  4. For the Driving Evaluation: please contact either  [facility] [employer] [facility] in [place] or  [employer] [phone].

## 2020-03-15 NOTE — Progress Notes (Signed)
NEUROLOGY CONSULTATION NOTE  Jason Stephenson MRN: [id number] DOB: 11/18/1945  Referring provider: Dr. Marton Redwood Primary care provider: Dr. Marton Redwood  Reason for consult:  dementia  Dear Dr Brigitte Pulse:  Thank you for your kind referral of Jason Stephenson for consultation of the above symptoms. Although his history is well known to you, please allow me to reiterate it for the purpose of our medical record. The patient was accompanied to the clinic by his friend [name] who also provides collateral information. Records and images were personally reviewed where available.   HISTORY OF PRESENT ILLNESS: This is a 74 year old right-handed man with a history of hyperparathyroidism s/p parathyroidectomy, neuropathy, presenting for evaluation of dementia. He is accompanied by his friend of 53 years, [name], who helps provide additional information. He feels his memory is pretty good. He has been living with his girlfriend for the past 1.5 years. He denies getting lost driving, missing medications or bill payments. He reports his girlfriend does not need to remind him of medications. She does most of the cooking. [name] got back in touch with him when he moved back to [place] 3 years ago and noticed memory changes at that time. Over the past 2 years, the memory lapses have progressed a little more to where he gets a little confused with issues and has trouble getting his sentences out. [name] has noticed he has difficulty composing a sentence, needing to stop and think. [name] has noticed he accumulates a lot of papers that they have tried to clean up, [name] thinks this is because he would forget and needs to look back on paperwork. [name] has helped him get plugged in with a PCP and arrange estate planning and [place]. [name] expressed concern about his driving with recognizing stoplights and time. He repeatedly says he does well with driving. No hygiene concerns. He has been on Donepezil  10mg  daily for a year, no side effects. [name] has not noticed any personality changes, paranoia or hallucinations. He states his mood is okay. He exercises regularly in the [employer]. There is no family history of dementia, no history of significant head injuries. He drinks alcohol on occasion.   He denies any headaches, dizziness, diplopia, dysarthria, dysphagia, neck/back pain, focal numbness/tingling/weakness, bowel/bladder dysfunction. No anosmia, tremors, no falls. Sleep is good.   MRI brain with and without contrast done January 2020 no acute changes, there is moderate diffuse atrophy, minimal chronic microvascular disease.   PAST MEDICAL HISTORY: History reviewed. No pertinent past medical history.  PAST SURGICAL HISTORY: Past Surgical History:  Procedure Laterality Date  . COLONOSCOPY    . parathyroid surgery  04/21/2017  . POLYPECTOMY      MEDICATIONS: Current Outpatient Medications on File Prior to Visit  Medication Sig Dispense Refill  . donepezil (ARICEPT) 10 MG tablet Take 10 mg by mouth at bedtime.    . finasteride (PROSCAR) 5 MG tablet Take 5 mg by mouth daily.    . Nutritional Supplements (VITAMIN D MAINTENANCE PO) Take 1,000 mg by mouth.    . Potassium 99 MG TABS Take by mouth.    . tadalafil (CIALIS) 5 MG tablet Take 5 mg by mouth daily as needed for erectile dysfunction.    . tamsulosin (FLOMAX) 0.4 MG CAPS capsule  (Patient not taking: Reported on 03/15/2020)     Current Facility-Administered Medications on File Prior to Visit  Medication Dose Route Frequency Provider Last Rate Last Admin  . 0.9 %  sodium chloride infusion  500 mL Intravenous Once Ladene Artist, MD        ALLERGIES: No Known Allergies  FAMILY HISTORY: Family History  Problem Relation Age of Onset  . Stroke Father   . Colon cancer Neg Hx   . Colon polyps Neg Hx   . Esophageal cancer Neg Hx   . Rectal cancer Neg Hx   . Stomach cancer Neg Hx     SOCIAL HISTORY: Social History     Socioeconomic History  . Marital status: Single    Spouse name: Not on file  . Number of children: Not on file  . Years of education: Not on file  . Highest education level: Not on file  Occupational History  . Not on file  Tobacco Use  . Smoking status: Former Research scientist (life sciences)  . Smokeless tobacco: Never Used  Substance and Sexual Activity  . Alcohol use: Yes    Comment: moderate   . Drug use: No  . Sexual activity: Not on file  Other Topics Concern  . Not on file  Social History Narrative   Right handed   No caffeine   One story home   Social Determinants of Health   Financial Resource Strain:   . Difficulty of Paying Living Expenses: Not on file  Food Insecurity:   . Worried About Charity fundraiser in the Last Year: Not on file  . Ran Out of Food in the Last Year: Not on file  Transportation Needs:   . Lack of Transportation (Medical): Not on file  . Lack of Transportation (Non-Medical): Not on file  Physical Activity:   . Days of Exercise per Week: Not on file  . Minutes of Exercise per Session: Not on file  Stress:   . Feeling of Stress : Not on file  Social Connections:   . Frequency of Communication with Friends and Family: Not on file  . Frequency of Social Gatherings with Friends and Family: Not on file  . Attends Religious Services: Not on file  . Active Member of Clubs or Organizations: Not on file  . Attends Archivist Meetings: Not on file  . Marital Status: Not on file  Intimate Partner Violence:   . Fear of Current or Ex-Partner: Not on file  . Emotionally Abused: Not on file  . Physically Abused: Not on file  . Sexually Abused: Not on file     PHYSICAL EXAM: Vitals:   03/15/20 0854  BP: (!) 192/79  Pulse: 72  Resp: 20  SpO2: 98%   General: No acute distress Head:  Normocephalic/atraumatic Skin/Extremities: No rash, no edema Neurological Exam: Mental status: alert and oriented to person, city. He has word-finding difficulties,  Speech is fluent at times, but other times he has word-finding difficulties, for instance unable to say the word "banker" or describe what type of exercises he does. Fund of knowledge is reduced.  Recent and remote memory are impaired. Attention and concentration are reduced.    Able to name objects, difficulty with repetition. MMSE 9/30.  MMSE - Mini Mental State Exam 03/15/2020  Orientation to time 0  Orientation to Place 1  Registration 3  Attention/ Calculation 0  Recall 3  Language- name 2 objects 2  Language- repeat 0  Language- follow 3 step command 0  Language- read & follow direction 0  Write a sentence 0  Copy design 0  Total score 9   Cranial nerves: CN I: not tested CN II: pupils  equal, round and reactive to light, visual fields intact CN III, IV, VI:  full range of motion, no nystagmus, no ptosis CN V: facial sensation intact CN VII: upper and lower face symmetric CN VIII: hearing intact to conversation CN IX, X: gag intact, uvula midline CN XI: sternocleidomastoid and trapezius muscles intact CN XII: tongue midline Bulk & Tone: normal, no fasciculations. Motor: 5/5 throughout with no pronator drift. Sensation: intact to light touch, cold, pin, vibration sense.  No extinction to double simultaneous stimulation.  Romberg test negative Deep Tendon Reflexes: +1 throughout Cerebellar: no incoordination on finger to nose testing Gait: narrow-based and steady, able to tandem walk adequately Tremor: none   IMPRESSION: This is a 74 year old right-handed man with a history of hyperparathyroidism s/p parathyroidectomy, neuropathy, presenting for evaluation of dementia. He has had progressive memory decline for at least 3 years, now with more word-finding difficulties. MMSE today 9/30, neurological exam non-focal. MRI brain showed moderate diffuse atrophy, no acute changes, minimal chronic microvascular disease. Symptoms suggestive of Alzheimer's dementia, mild. He reportedly  continues to manage complex tasks well, however concern has been raised about driving. We discussed recommendation for a formal driving evaluation. He will be referred to Speech therapy as well. We discussed adding on Memantine to Donepezil. Side effects and expectations discussed, start Memantine 10mg  qhs x 2 weeks, then increase to 1 tab BID. Continue Donepezil 10mg  daily. Continue close supervision. We discussed the importance of control of vascular risk factors, physical exercise, and brain stimulation exercises for brain health. Follow-up in 6 months, they know to call for any changes.   Thank you for allowing me to participate in the care of this patient. Please do not hesitate to call for any questions or concerns.   Ellouise Newer, M.D.  CC: Dr. Brigitte Pulse

## 2020-03-19 ENCOUNTER — Telehealth: Payer: Self-pay | Admitting: Neurology

## 2020-03-19 DIAGNOSIS — F03A0 Unspecified dementia, mild, without behavioral disturbance, psychotic disturbance, mood disturbance, and anxiety: Secondary | ICD-10-CM

## 2020-03-19 DIAGNOSIS — F0390 Unspecified dementia without behavioral disturbance: Secondary | ICD-10-CM

## 2020-03-19 NOTE — Telephone Encounter (Signed)
See other phone note

## 2020-03-19 NOTE — Telephone Encounter (Signed)
Can you pls call his friend [name] listed as contact and let him know about patient's concern about cost. Is this the same cost for both places I gave them to call? Can also try [facility] evaluators and see if less costly: [employer] [phone] or [facility] [phone]. Thanks

## 2020-03-19 NOTE — Telephone Encounter (Signed)
Spoke with [name] and gave him Dr Aquino's recommendations and he voiced understanding.

## 2020-03-19 NOTE — Telephone Encounter (Signed)
Pls send referral for Driving Evaluation. Also, when speaking with patient, pls let him know that the speech therapy is to help with his word-finding difficulties, would proceed as discussed. Thanks!

## 2020-03-19 NOTE — Telephone Encounter (Signed)
Patient wanted to set up an appointment for driving rehab, but was told he had to have a referral. Please send referral to [facility] (fax # [phone]) in [facility] and call patient when done. Thanks!

## 2020-03-19 NOTE — Telephone Encounter (Signed)
Patient called in about his Jason Stephenson. They tried to set up an appointment and it would be 3.5-4 hours and would cost $700 before they could schedule an appointment. He would like to know if this something he absolutely has to do? He also doesn't remember talking about Speech Therapy and would like to make sure he needs that.

## 2020-03-20 NOTE — Addendum Note (Signed)
Addended by: Ulice Brilliant T on: 03/20/2020 09:51 AM   Modules accepted: Orders

## 2020-03-20 NOTE — Telephone Encounter (Signed)
Referral has been faxed.

## 2020-03-20 NOTE — Telephone Encounter (Signed)
Patient notified and voiced understanding.

## 2020-03-26 ENCOUNTER — Other Ambulatory Visit: Payer: Self-pay

## 2020-03-26 ENCOUNTER — Ambulatory Visit: Payer: Medicare HMO | Attending: Neurology | Admitting: Speech Pathology

## 2020-03-26 DIAGNOSIS — R41841 Cognitive communication deficit: Secondary | ICD-10-CM | POA: Diagnosis present

## 2020-03-26 DIAGNOSIS — R4701 Aphasia: Secondary | ICD-10-CM | POA: Insufficient documentation

## 2020-03-26 NOTE — Patient Instructions (Signed)
Tips for Talking with People who have Aphasia  . Say one thing at a time . Don't  rush - slow down, be patient . Talk face to face . Reduce background noise . Relax - be natural . Use pen and paper . Write down key words . Draw diagrams or pictures . Don't pretend you understand . Ask what helps . Recap - check you both understand . Be a partner, not a therapist   Aphasia does not affect intelligence, only language. The person with aphasia can still: make decisions, have opinions, and socialize.   Describing words  What group does it belong to?  What do I use it for?  Where can I find it?  What does it LOOK like?  What other words go with it?  What is the 1st sound of the word?   Many Ways to Communicate  Describe it Write it Draw it Gesture it Use related words  Resources  [facility] [place] with Dr. Jessica Obermeyer at [employer] - email [email or web address]  Talk[employer] Therapy app by [employer] Virtual Connections on [employer] website National Aphasia Association - naa.aphasia.org Aphasia Recovery Connection - aphasiarecoveryconnection.org [employer]actus therapy apps [employer]  Watch: Patience Listening and Communicating with Aphasia Patients on YouTube https://www.youtube.com/watch?v=[email or web address]     

## 2020-03-26 NOTE — Therapy (Signed)
[facility] [address] [place], [place], [address] Phone: [phone]   Fax:  [phone]  Speech Language Pathology Evaluation  Patient Details  Name: Jason Stephenson MRN: [id number] Date of Birth: Aug 08, 1945 Referring Provider (SLP): Dr. Delice Lesch   Encounter Date: 03/26/2020   End of Session - 03/26/20 1543    Visit Number 1    Number of Visits 9    Date for SLP Re-Evaluation 05/25/20    Authorization Type [employer] MCR    SLP Start Time 1016    SLP Stop Time  1105    SLP Time Calculation (min) 49 min    Activity Tolerance Patient tolerated treatment well           No past medical history on file.  Past Surgical History:  Procedure Laterality Date  . COLONOSCOPY    . parathyroid surgery  04/21/2017  . POLYPECTOMY      There were no vitals filed for this visit.   Subjective Assessment - 03/26/20 1020    Subjective "If I'm thinking about something, it might not feel like I know something about it."    Patient is accompained by: Family member   [place], s/o   Currently in Pain? No/denies              SLP Evaluation [facility] - 03/26/20 1020      SLP Visit Information   SLP Received On 03/26/20    Referring Provider (SLP) Dr. Delice Lesch    Onset Date 03/15/20    Medical Diagnosis mild dementia      Balance Screen   Has the patient fallen in the past 6 months No    Has the patient had a decrease in activity level because of a fear of falling?  No    Is the patient reluctant to leave their home because of a fear of falling?  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     Cognition   Overall Cognitive Status Impaired/Different from baseline   not formally assessed today    Area of Impairment Attention;Memory    Current Attention Level Selective    Memory Decreased short-term memory      Auditory Comprehension   Overall Auditory Comprehension Other (comment)   assessment not complete   Yes/No Questions Within Functional Limits   51/60   Interfering Components Attention;Processing speed;Working Statistician Comprehension Comments anticipate difficulties with multistep/complex commands, possibly due to cognition vs language      Visual Recognition/Discrimination   Discrimination Not tested      Reading Comprehension   Reading Status Not tested   slow/incomplete processing of self-assessment questionnaire     Expression   Primary Mode of Expression Verbal      Verbal Expression   Overall Verbal Expression Impaired    Initiation No impairment    Automatic Speech Name;Social Response    Level of Generative/Spontaneous Verbalization Conversation    Naming Impairment   SO: "He calls everything a 'thing'"   Confrontation 25-49% accurate   6/15 BNT short form   Divergent 0-24% accurate   3 animals in 60 seconds   Verbal Errors Semantic paraphasias;Perseveration   perseverates "  water" several times   Pragmatics No impairment    Effective Techniques Sentence completion;Semantic cues;Open ended questions   paired with gesture was occasionally effective   Non-Verbal Means of Communication Gestures   requires cues     Written Expression   Dominant Hand Right    Written Expression Not tested    Overall Writen Expression pt, SO deny problems; "handwriting has gotten smaller"      Oral Motor/Sensory Function   Overall Oral Motor/Sensory Function Appears within functional limits for tasks assessed      Motor Speech   Overall Motor Speech Appears within functional limits for tasks assessed      Standardized Assessments   Standardized Assessments  Boston Naming Test-2nd edition;Western Aphasia Battery revised    Boston Naming Test-2nd edition   6/15 short form    Western Aphasia Battery revised  Testing initiated and to be completed next 1-3 sessions. Picture description with hesitations, significant wordfinding difficulty, semantic paraphasias (antenna/radio, shoes-sands/sandals).                               SLP Long Term Goals - 03/26/20 1606      SLP LONG TERM GOAL #1   Title Patient will use compensations for aphasia in 5-8 minutes simple-mod complex conversation with mod caregiver cues, x 3 visits.    Time 4    Period Weeks    Status New      SLP LONG TERM GOAL #2   Title Pt will complete standardized assessment of language.    Time 4    Period Weeks    Status New      SLP LONG TERM GOAL #3   Title Patient/caregiver will demonstrate knowledge of appropriate activities/home program for cognitive-linguistic skills.    Time 4    Period Weeks    Status New      SLP LONG TERM GOAL #4   Title Pt/caregiver will report using aphasia compensations or supportive conversations strategies in a communication breakdown outside [place] room..    Time 4    Period Weeks            Plan - 03/26/20 1544    Clinical Impression Statement Mr. Theiler presents with aphasia, likely moderate, and cognitive communication deficits worsening gradually over 3 years, per chart review. Language testing initiated today and to be completed next 1-3 sessions. He has lived with his significant other for ~1.5 years, who reports others have difficulty understanding him in conversation, and that he becomes frustrated when he has difficulties expressing himself. Speech is generally fluent but often empty, with non-specific language, circumlocution, wordfinding difficulties (occasional hesitations however pt is not always aware of errors). Confrontation naming is significantly impaired. Auditory comprehension appears to be intact for simple-mod complex conversation, however pt had difficulty processing abstract questions on a questionnaire  about his communication abilities. Patient and his SO appear eager to learn strategies to aid pt's communication abilities as well as to preserve function. I recommend skilled ST to reduce pt frustration, improve ability to communicate with friends and family, and maximize language/communication abilities.    Speech Therapy Frequency 2x / week    Duration 4 weeks    Treatment/Interventions Language facilitation;Environmental controls;Cueing hierarchy;SLP instruction and feedback;Cognitive reorganization;Functional tasks;Compensatory strategies;Internal/external aids;Multimodal communcation approach;Patient/family education    Potential to Achieve Goals Fair    Potential Considerations Other (comment);Ability to learn/carryover information   language skills expected to decline with disease  progression   SLP Home Exercise Plan aphasia education provided    Consulted and Agree with Plan of Care Patient;Family member/caregiver    Family Member Consulted Significant other, [name]           Patient will benefit from skilled therapeutic intervention in order to improve the following deficits and impairments:   Aphasia  Cognitive communication deficit    Problem List Patient Active Problem List   Diagnosis Date Noted  . 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/09/2017  . Hyperparathyroidism ([place]) 03/09/2017  . Hamstring strain 10/22/2015  . Lateral epicondylitis of left elbow 06/28/2014  . Greater trochanteric bursitis of left hip 01/23/2014  . Lateral epicondylitis of right elbow 11/24/2011  . LOW BACK PAIN, ACUTE 10/18/2008   Deneise Lever, [place], [place] Speech-Language Pathologist  Aliene Altes 03/26/2020, 4:13 PM  [facility] [address] [place], [place], [address] Phone: [phone]   Fax:  [phone]  Name: JAMARCUS LADUKE MRN: [id number] Date of Birth: [birth year]

## 2020-04-01 ENCOUNTER — Other Ambulatory Visit: Payer: Self-pay

## 2020-04-01 ENCOUNTER — Encounter: Payer: Self-pay | Admitting: Speech Pathology

## 2020-04-01 ENCOUNTER — Ambulatory Visit: Payer: Medicare HMO | Admitting: Speech Pathology

## 2020-04-01 DIAGNOSIS — R4701 Aphasia: Secondary | ICD-10-CM

## 2020-04-01 DIAGNOSIS — R41841 Cognitive communication deficit: Secondary | ICD-10-CM

## 2020-04-01 NOTE — Patient Instructions (Signed)
  Aphasia affects language: understanding spoken words and reading (comprehension) And talking and writing (expression)   Tips for Talking with People who have Aphasia  . Say one thing at a time . Don't  rush - slow down, be patient . Talk face to face . Reduce background noise . Relax - be natural . Use pen and paper . Write down key words . Draw diagrams or pictures . Don't pretend you understand . Ask what helps . Recap - check you both understand . Be a partner, not a therapist   Aphasia does not affect intelligence, only language. The person with aphasia can still: make decisions, have opinions, and socialize.   Describing words  What group does it belong to?  What do I use it for?  Where can I find it?  What does it LOOK like?  What other words go with it?  What is the 1st sound of the word?   Many Ways to Communicate  Describe it Write it Draw it Gesture it Use related words  Resources  TalkPath Therapy app by Georges Lynch Connections on [employer] website National Aphasia Association - naa.aphasia.org Aphasia Recovery Connection - aphasiarecoveryconnection.org Tactus therapy apps Constant Therapy  Watch: Patience Listening and Communicating with Aphasia Patients on YouTube MarathonDancing.gl   Provided by: Durward Parcel, [phone]

## 2020-04-03 NOTE — Therapy (Addendum)
[facility] [address] [place], [place], [address] Phone: [phone]   Fax:  [phone]  Speech Language Pathology Treatment  Patient Details  Name: Jason Stephenson MRN: [id number] Date of Birth: Oct 13, 1945 Referring Provider (SLP): Dr. Delice Lesch   Encounter Date: 04/01/2020   End of Session - 04/03/20 0904    Visit Number 2    Number of Visits 9    Date for SLP Re-Evaluation 05/25/20    Authorization Type [employer] MCR    SLP Start Time 3154    SLP Stop Time  1452    SLP Time Calculation (min) 49 min    Activity Tolerance Patient tolerated treatment well           History reviewed. No pertinent past medical history.  Past Surgical History:  Procedure Laterality Date  . COLONOSCOPY    . parathyroid surgery  04/21/2017  . POLYPECTOMY      There were no vitals filed for this visit.            SLP Education - 04/03/20 0902    Education Details aphasia ed; compensations for aphasia    Person(s) Educated Patient;Other (comment)   s/o [place]   Methods Explanation;Demonstration;Verbal cues;Handout    Comprehension Verbalized understanding;Returned demonstration;Verbal cues required;Need further instruction          SKILLED TREATMENT:   Completed Western Aphasia Battery (WAB) - Revealed moderate non fluent aphasia (recetive language > expressive language) with semantic paraphasias which he was inconsistently aware of (fork/knife; smoke/matches). Repetition intact to short phrase, commands intact to basic 2 step commands. Overall score 69.9 which is moderate impairment. Results may be affected by cognitive impairments as well. Introduced Nutritional therapist as Health and safety inspector for word finding and trained [place] to use specific questions to [employer] in communicating. [name] required frequent questioning cues to use strategy in structured task   SLP Long Term Goals - 04/03/20 0904      SLP LONG TERM GOAL #1    Title Patient will use compensations for aphasia in 5-8 minutes simple-mod complex conversation with mod caregiver cues, x 3 visits.    Time 4    Period Weeks    Status On-going      SLP LONG TERM GOAL #2   Title Pt will complete standardized assessment of language.    Time 4    Period Weeks    Status On-going      SLP LONG TERM GOAL #3   Title Patient/caregiver will demonstrate knowledge of appropriate activities/home program for cognitive-linguistic skills.    Time 4    Period Weeks    Status On-going      SLP LONG TERM GOAL #4   Title Pt/caregiver will report using aphasia compensations or supportive conversations strategies in a communication breakdown outside [place] room..    Time 4    Period Weeks    Status On-going            Plan - 04/03/20 0903    Clinical Impression Statement Mr. Revelo presents with aphasia, likely moderate, and cognitive communication deficits worsening gradually over 3 years, per chart review. Language testing initiated today and to be completed next 1-3 sessions. He has lived with his significant other for ~1.5 years, who reports others have difficulty understanding him in conversation, and that he becomes frustrated when he has difficulties expressing himself. Speech is generally fluent but often empty, with non-specific language, circumlocution, wordfinding difficulties (occasional hesitations however pt is not always aware  of errors). Confrontation naming is significantly impaired. Auditory comprehension appears to be intact for simple-mod complex conversation, however pt had difficulty processing abstract questions on a questionnaire about his communication abilities. Patient and his SO appear eager to learn strategies to aid pt's communication abilities as well as to preserve function. I recommend skilled ST to reduce pt frustration, improve ability to communicate with friends and family, and maximize language/communication abilities.    Speech Therapy  Frequency 2x / week    Duration 4 weeks   or 9  visits   Treatment/Interventions Language facilitation;Environmental controls;Cueing hierarchy;SLP instruction and feedback;Cognitive reorganization;Functional tasks;Compensatory strategies;Internal/external aids;Multimodal communcation approach;Patient/family education    Potential to Achieve Goals Fair    Potential Considerations Medical prognosis;Ability to learn/carryover information           Patient will benefit from skilled therapeutic intervention in order to improve the following deficits and impairments:   Aphasia  Cognitive communication deficit    Problem List Patient Active Problem List   Diagnosis Date Noted  . 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/09/2017  . Hyperparathyroidism ([place]) 03/09/2017  . Hamstring strain 10/22/2015  . Lateral epicondylitis of left elbow 06/28/2014  . Greater trochanteric bursitis of left hip 01/23/2014  . Lateral epicondylitis of right elbow 11/24/2011  . LOW BACK PAIN, ACUTE 10/18/2008    Cheyenna Pankowski, [name] MS, [name] 04/03/2020, 9:05 AM  [facility] [address] [place], [place], [address] Phone: [phone]   Fax:  [phone]   Name: GREGROY DOMBKOWSKI MRN: [id number] Date of Birth: 1945-06-01

## 2020-04-15 ENCOUNTER — Ambulatory Visit: Payer: Medicare HMO | Admitting: Speech Pathology

## 2020-04-15 ENCOUNTER — Encounter: Payer: Self-pay | Admitting: Speech Pathology

## 2020-04-15 ENCOUNTER — Other Ambulatory Visit: Payer: Self-pay

## 2020-04-15 DIAGNOSIS — R4701 Aphasia: Secondary | ICD-10-CM

## 2020-04-15 DIAGNOSIS — R41841 Cognitive communication deficit: Secondary | ICD-10-CM

## 2020-04-16 NOTE — Therapy (Signed)
[facility] [address] [place], [place], [address] Phone: [phone]   Fax:  [phone]  Speech Language Pathology Treatment  Patient Details  Name: Jason Stephenson MRN: [id number] Date of Birth: [birth year] Referring Provider (SLP): Dr. Delice Lesch   Encounter Date: 04/15/2020   End of Session - 04/16/20 1028    Visit Number 3    Number of Visits 9    Date for SLP Re-Evaluation 05/25/20    Authorization Type [employer] MCR    SLP Start Time 3382    SLP Stop Time  5053    SLP Time Calculation (min) 43 min           History reviewed. No pertinent past medical history.  Past Surgical History:  Procedure Laterality Date  . COLONOSCOPY    . parathyroid surgery  04/21/2017  . POLYPECTOMY      There were no vitals filed for this visit.   Subjective Assessment - 04/15/20 1504    Subjective "He had to pass a brain test"    Patient is accompained by: Family member   s/o Jason Stephenson   Currently in Pain? No/denies                 ADULT SLP TREATMENT - 04/16/20 1010      General Information   Behavior/Cognition Alert;Cooperative;Pleasant mood      Treatment Provided   Treatment provided Cognitive-Linquistic      Cognitive-Linquistic Treatment   Treatment focused on Aphasia;Cognition;Patient/family/caregiver education    [place] arrive quite upset re: driving eval, as Event organiser couldn't name traffic signs and didn't pass the cognitive assessment. He was instructed not to drive due to this. Initiated training in vebal compensations for aphasia, pt required frequent mod A with visual aids to complete Semantic Feature Analysis to train pt for verbal compensations for word finding. He requied frequent mod questioning cues and 2 choice to utilized SFA chart. I modeled and instructed use of chart for [facility] to use as questioning cues to [employer] in conversation.  Initiated list of persoanlly  relevant words in categories for Jason Stephenson to keep as a Visual merchandiser. Demonstrated use of writing at word level to augment Jason Stephenson's auditory comprehension. Instructed them that as dementia/aphasia progresses, simple reading at word level may be preserved.      Assessment / Recommendations / Plan   Plan Continue with current plan of care      Progression Toward Goals   Progression toward goals Progressing toward goals            SLP Education - 04/16/20 1022    Education Details compenations for aphasia;    Person(s) Educated Patient;Other (comment)   s/o Jason Stephenson   Methods Explanation;Demonstration;Verbal cues    Comprehension Verbal cues required;Need further instruction              SLP Long Term Goals - 04/16/20 1028      SLP LONG TERM GOAL #1   Title Patient will use compensations for aphasia in 5-8 minutes simple-mod complex conversation with mod caregiver cues, x 3 visits.    Time 3    Period Weeks    Status On-going      SLP LONG TERM GOAL #2   Title Pt will complete standardized assessment of language.    Time 3    Period Weeks    Status On-going      SLP LONG TERM GOAL #3   Title Patient/caregiver will  demonstrate knowledge of appropriate activities/home program for cognitive-linguistic skills.    Time 3    Period Weeks    Status On-going      SLP LONG TERM GOAL #4   Title Pt/caregiver will report using aphasia compensations or supportive conversations strategies in a communication breakdown outside [place] room..    Time 3    Period Weeks    Status On-going            Plan - 04/16/20 1023    Clinical Impression Statement Moderate non fluent aphasia persists resulting in frequent frustration and communication break down with partner and friends. At this time, Jason Stephenson is completing IADL's with mod I. Ongoing training for compensations for aphasia and multimodal communication for pt's current commuication level and as aphasia progresses. Recommend continue  skilled ST to maximize communicatoin across settings for safety and QOL.Marland Kitchen    Speech Therapy Frequency 2x / week    Duration 4 weeks   or 9 visits   Treatment/Interventions Language facilitation;Environmental controls;Cueing hierarchy;SLP instruction and feedback;Cognitive reorganization;Functional tasks;Compensatory strategies;Internal/external aids;Multimodal communcation approach;Patient/family education    Potential Considerations Medical prognosis;Ability to learn/carryover information           Patient will benefit from skilled therapeutic intervention in order to improve the following deficits and impairments:   Aphasia  Cognitive communication deficit    Problem List Patient Active Problem List   Diagnosis Date Noted  . 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/09/2017  . Hyperparathyroidism ([place]) 03/09/2017  . Hamstring strain 10/22/2015  . Lateral epicondylitis of left elbow 06/28/2014  . Greater trochanteric bursitis of left hip 01/23/2014  . Lateral epicondylitis of right elbow 11/24/2011  . LOW BACK PAIN, ACUTE 10/18/2008    Parth Mccormac, [name] MS, [name] 04/16/2020, 10:29 AM  [facility] [address] [place], [place], [address] Phone: [phone]   Fax:  [phone]   Name: Jason Stephenson MRN: [id number] Date of Birth: 15-Dec-1945

## 2020-04-22 ENCOUNTER — Other Ambulatory Visit: Payer: Self-pay

## 2020-04-22 ENCOUNTER — Ambulatory Visit: Payer: Medicare HMO | Attending: Neurology | Admitting: Speech Pathology

## 2020-04-22 ENCOUNTER — Encounter: Payer: Self-pay | Admitting: Speech Pathology

## 2020-04-22 DIAGNOSIS — R41841 Cognitive communication deficit: Secondary | ICD-10-CM

## 2020-04-22 DIAGNOSIS — R4701 Aphasia: Secondary | ICD-10-CM | POA: Diagnosis present

## 2020-04-22 NOTE — Therapy (Signed)
[facility] [address] [place], [place], [address] Phone: [phone]   Fax:  [phone]  Speech Language Pathology Treatment  Patient Details  Name: Jason Stephenson MRN: [id number] Date of Birth: 09-May-1946 Referring Provider (SLP): Dr. Delice Lesch   Encounter Date: 04/22/2020   End of Session - 04/22/20 1555    Visit Number 4    Number of Visits 9    Date for SLP Re-Evaluation 05/25/20    Authorization Type [employer] MCR    SLP Start Time 1446    SLP Stop Time  1534    SLP Time Calculation (min) 48 min    Activity Tolerance Patient tolerated treatment well           History reviewed. No pertinent past medical history.  Past Surgical History:  Procedure Laterality Date  . COLONOSCOPY    . parathyroid surgery  04/21/2017  . POLYPECTOMY      There were no vitals filed for this visit.   Subjective Assessment - 04/22/20 1454    Subjective "We've been working on the things"    Patient is accompained by: Family member   [name]   Currently in Pain? No/denies                 ADULT SLP TREATMENT - 04/22/20 1455      General Information   Behavior/Cognition Alert;Cooperative;Pleasant mood      Treatment Provided   Treatment provided Cognitive-Linquistic      Cognitive-Linquistic Treatment   Treatment focused on Aphasia;Cognition;Patient/family/caregiver education    Skilled Treatment Targeted word finding, flexibility and sentence generation generating 2 sentences with multiple meaning words. Jason Stephenson requied frequent mod A to generate alternate meaning and sentence (verbal and written) for 2nd meaning. He benefitted from visual supports to organize 2 different sentences. Verb Forensic scientist (VNeST) to target word finding, pt required frequent mod to max semantantic, verbal and questioning cues to generate 3 subjects and objects for each verb. Written cues to use "wh" ?'s to generate  mildly complex sentence for each verb. Demonstrated and instructed [name] re: how aphasia affects auditory comprehension, with visual/written supports. Modeled use of written cues of 2-3 words/choices, instructions to augment Jason Stephenson's auditory comprehension.  [name] endorsed that she didn't realize how aphasia affects Jason Stephenson understanding her.       Assessment / Recommendations / Plan   Plan Continue with current plan of care      Progression Toward Goals   Progression toward goals Progressing toward goals            SLP Education - 04/22/20 1553    Education Details compensations for auditory comprehension and word finding    Person(s) Educated Patient;Other (comment)   s/o [name]   Methods Explanation;Demonstration;Verbal cues    Comprehension Verbalized understanding;Verbal cues required;Need further instruction              SLP Long Term Goals - 04/22/20 1555      SLP LONG TERM GOAL #1   Title Patient will use compensations for aphasia in 5-8 minutes simple-mod complex conversation with mod caregiver cues, x 3 visits.    Time 2    Period Weeks    Status On-going      SLP LONG TERM GOAL #2   Title Pt will complete standardized assessment of language.    Time 3    Period Weeks    Status Achieved      SLP LONG TERM  GOAL #3   Title Patient/caregiver will demonstrate knowledge of appropriate activities/home program for cognitive-linguistic skills.    Time 2    Period Weeks    Status On-going      SLP LONG TERM GOAL #4   Title Pt/caregiver will report using aphasia compensations or supportive conversations strategies in a communication breakdown outside [name] room..    Time 2    Period Weeks    Status On-going            Plan - 04/22/20 1554    Clinical Impression Statement Moderate non fluent aphasia persists resulting in frequent frustration and communication break down with partner and friends. At this time, Jason Stephenson is completing IADL's with mod I.  Ongoing training for compensations for aphasia and multimodal communication for pt's current commuication level and as aphasia progresses. Recommend continue skilled ST to maximize communicatoin across settings for safety and QOL.Marland Kitchen    Speech Therapy Frequency 2x / week    Duration 4 weeks   9 visits   Treatment/Interventions Language facilitation;Environmental controls;Cueing hierarchy;SLP instruction and feedback;Cognitive reorganization;Functional tasks;Compensatory strategies;Internal/external aids;Multimodal communcation approach;Patient/family education    Potential to Achieve Goals Fair    Potential Considerations Medical prognosis;Ability to learn/carryover information           Patient will benefit from skilled therapeutic intervention in order to improve the following deficits and impairments:   Aphasia  Cognitive communication deficit    Problem List Patient Active Problem List   Diagnosis Date Noted  . 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/09/2017  . Hyperparathyroidism ([place]) 03/09/2017  . Hamstring strain 10/22/2015  . Lateral epicondylitis of left elbow 06/28/2014  . Greater trochanteric bursitis of left hip 01/23/2014  . Lateral epicondylitis of right elbow 11/24/2011  . LOW BACK PAIN, ACUTE 10/18/2008    [name], [name] MS, [name] 04/22/2020, 3:56 PM  [facility] [address] [place], [place], [address] Phone: [phone]   Fax:  [phone]   Name: Jason Stephenson MRN: [id number] Date of Birth: 1945-11-11

## 2020-04-22 NOTE — Patient Instructions (Signed)
    Keep trying to say a few descriptions about the word you are having trouble finding  Writing a few words can help [name] understand the topic or instructions  When [name] can't think of a word and you may know what it is, write a choice of 3 words and see if any are correct

## 2020-04-24 ENCOUNTER — Ambulatory Visit: Payer: Medicare HMO | Admitting: Speech Pathology

## 2020-04-24 ENCOUNTER — Other Ambulatory Visit: Payer: Self-pay

## 2020-04-24 DIAGNOSIS — R4701 Aphasia: Secondary | ICD-10-CM | POA: Diagnosis not present

## 2020-04-24 DIAGNOSIS — R41841 Cognitive communication deficit: Secondary | ICD-10-CM

## 2020-04-24 NOTE — Therapy (Signed)
[facility] [address] [place], [place], [address] Phone: [phone]   Fax:  [phone]  Speech Language Pathology Treatment  Patient Details  Name: Jason Stephenson MRN: [id number] Date of Birth: January 26, 1946 Referring Provider (SLP): Dr. Delice Lesch   Encounter Date: 04/24/2020   End of Session - 04/24/20 1554    Visit Number 5    Number of Visits 9    Date for SLP Re-Evaluation 05/25/20    Authorization Type [employer] MCR    SLP Start Time 1450    SLP Stop Time  1540    SLP Time Calculation (min) 50 min    Activity Tolerance Patient tolerated treatment well           No past medical history on file.  Past Surgical History:  Procedure Laterality Date  . COLONOSCOPY    . parathyroid surgery  04/21/2017  . POLYPECTOMY      There were no vitals filed for this visit.          ADULT SLP TREATMENT - 04/24/20 1519      General Information   Behavior/Cognition Alert;Cooperative;Pleasant mood      Treatment Provided   Treatment provided Cognitive-Linquistic      Cognitive-Linquistic Treatment   Treatment focused on Aphasia;Cognition;Patient/family/caregiver education    Skilled Treatment Verbal and gestural Compensations for aphasia targeted in structured task (similariteis/differences) with frequent mod to max questioning cues, sentence completion to generate salient words. [name] brought in personally relevant word sheet.. Initiated communication book adding family names, cities, relations, jobs Social research officer, government. Event organiser required frequent mod to max A to generate this information. Will continue to add to communication document.       Assessment / Recommendations / Plan   Plan Continue with current plan of care      Progression Toward Goals   Progression toward goals Progressing toward goals            SLP Education - 04/24/20 1553    Education Details get binder, add to personally relevant word list     Person(s) Educated Patient;Other (comment)   s/o   Methods Explanation;Demonstration;Verbal cues    Comprehension Verbalized understanding;Verbal cues required;Need further instruction              SLP Long Term Goals - 04/24/20 1554      SLP LONG TERM GOAL #1   Title Patient will use compensations for aphasia in 5-8 minutes simple-mod complex conversation with mod caregiver cues, x 3 visits.    Time 2    Period Weeks    Status On-going      SLP LONG TERM GOAL #2   Title Pt will complete standardized assessment of language.    Time 3    Period Weeks    Status Achieved      SLP LONG TERM GOAL #3   Title Patient/caregiver will demonstrate knowledge of appropriate activities/home program for cognitive-linguistic skills.    Time 2    Period Weeks    Status On-going      SLP LONG TERM GOAL #4   Title Pt/caregiver will report using aphasia compensations or supportive conversations strategies in a communication breakdown outside [facility] [place] room..    Time 2    Period Weeks    Status On-going            Plan - 04/24/20 1554    Clinical Impression Statement Moderate non fluent aphasia persists resulting in frequent frustration and communication break down with partner  and friends. At this time, [name] is completing IADL's with mod I. Ongoing training for compensations for aphasia and multimodal communication for pt's current commuication level and as aphasia progresses. Recommend continue skilled ST to maximize communicatoin across settings for safety and QOL.Marland Kitchen    Speech Therapy Frequency 2x / week    Duration 4 weeks   9 visits   Treatment/Interventions Language facilitation;Environmental controls;Cueing hierarchy;SLP instruction and feedback;Cognitive reorganization;Functional tasks;Compensatory strategies;Internal/external aids;Multimodal communcation approach;Patient/family education    Potential to Achieve Goals Fair    Potential Considerations Medical prognosis;Ability to  learn/carryover information           Patient will benefit from skilled therapeutic intervention in order to improve the following deficits and impairments:   Aphasia  Cognitive communication deficit    Problem List Patient Active Problem List   Diagnosis Date Noted  . 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/09/2017  . Hyperparathyroidism ([facility]) 03/09/2017  . Hamstring strain 10/22/2015  . Lateral epicondylitis of left elbow 06/28/2014  . Greater trochanteric bursitis of left hip 01/23/2014  . Lateral epicondylitis of right elbow 11/24/2011  . LOW BACK PAIN, ACUTE 10/18/2008    Weston Kallman, [name] MS, [name] 04/24/2020, 3:55 PM  [facility] [address] [place], [place], [address] Phone: [phone]   Fax:  [phone]   Name: JARMAL LEWELLING MRN: [id number] Date of Birth: 1945/07/19

## 2020-04-29 ENCOUNTER — Ambulatory Visit: Payer: Medicare HMO

## 2020-05-03 ENCOUNTER — Ambulatory Visit: Payer: Medicare HMO | Admitting: Speech Pathology

## 2020-05-03 ENCOUNTER — Encounter: Payer: Self-pay | Admitting: Speech Pathology

## 2020-05-03 ENCOUNTER — Other Ambulatory Visit: Payer: Self-pay

## 2020-05-03 DIAGNOSIS — R4701 Aphasia: Secondary | ICD-10-CM

## 2020-05-03 DIAGNOSIS — R41841 Cognitive communication deficit: Secondary | ICD-10-CM

## 2020-05-03 NOTE — Therapy (Signed)
[facility] [address] [place], [place], [address] Phone: [phone]   Fax:  [phone]  Speech Language Pathology Treatment  Patient Details  Name: Jason Stephenson MRN: [id number] Date of Birth: 09-04-1945 Referring Provider (SLP): Dr. Delice Lesch   Encounter Date: 05/03/2020   End of Session - 05/03/20 1323    Visit Number 6    Number of Visits 25    Date for SLP Re-Evaluation 07/12/20    Authorization Type [employer] MCR    SLP Start Time 1150    SLP Stop Time  1240    SLP Time Calculation (min) 50 min    Activity Tolerance Patient tolerated treatment well           History reviewed. No pertinent past medical history.  Past Surgical History:  Procedure Laterality Date  . COLONOSCOPY    . parathyroid surgery  04/21/2017  . POLYPECTOMY      There were no vitals filed for this visit.   Subjective Assessment - 05/03/20 1304    Subjective "Jason Stephenson" - pt's best friend attended session    Patient is accompained by: Family member    Currently in Pain? No/denies                 ADULT SLP TREATMENT - 05/03/20 1305      General Information   Behavior/Cognition Alert;Cooperative;Pleasant mood      Treatment Provided   Treatment provided Cognitive-Linquistic      Cognitive-Linquistic Treatment   Treatment focused on Cognition;Patient/family/caregiver education    Skilled Treatment Jason Stephenson's friend Jason Stephenson attended session. Targeted communication partner training for Jason Stephenson. Demonstrated using written key words and writen choices to support Jason Stephenson's participation in convesation in therapy setting. Educated that reading may be most preserved as aphasia progresses. Introduced training in non verbal communiation including gestures and drawing. Instructed family on giving Jason Stephenson more time to respond and educating his children who are coming to visit on strategies to improve Jason Stephenson's participation in  conversations.Ongoing generation of communication book - added favorite places and activities today. Printed out pages and added to book. Instructed family to encourage Jason Stephenson to use the book when he can't find family names.      Assessment / Recommendations / Plan   Plan Goals updated      Progression Toward Goals   Progression toward goals Progressing toward goals   recertified for continued ST           SLP Education - 05/03/20 1311    Education Details non verbal compensations for aphasia; multimodal communication for expressive and to support receptive language    Person(s) Educated Patient   s/o [name]; friend Event organiser   Methods Explanation;Demonstration;Verbal cues;Handout    Comprehension Verbalized understanding;Returned demonstration;Verbal cues required;Need further instruction              SLP Long Term Goals - 05/03/20 1322      SLP LONG TERM GOAL #1   Title Patient will use compensations for aphasia in 5-8 minutes simple-mod complex conversation with mod caregiver cues, x 3 visits.    Time 12    Period Weeks    Status On-going      SLP LONG TERM GOAL #2   Title Pt will complete standardized assessment of language.    Time 3    Period Weeks    Status Achieved      SLP LONG TERM GOAL #3   Title Patient/caregiver will demonstrate knowledge of appropriate  activities/home program for cognitive-linguistic skills.    Time 12    Period Weeks    Status On-going      SLP LONG TERM GOAL #4   Title Pt/caregiver will report using aphasia compensations or supportive conversations strategies in a communication breakdown outside [facility] room..    Time 12    Period Weeks    Status On-going            Plan - 05/03/20 1320    Clinical Impression Statement Moderate non fluent aphasia persists resulting in frequent frustration and communication break down with partner and friends. At this time, Jason Stephenson is completing IADL's with mod I. Ongoing training for compensations for  aphasia and multimodal communication for pt's current commuication level and as aphasia progresses. Ongoing training of communication partners to support Jason Stephenson's life particiation. Recommend continue skilled ST to maximize communicatoin across settings for safety and QOL. Initiating low tech communication book to assist with finding family names, favorite places and activities. Encourage Jason Stephenson to have his most frequent communication partners attend some ST sessions. Goals updated and extended, recertification for 8 more weeks submitted as ongoing development of AAC and partner training required.    Speech Therapy Frequency 2x / week    Duration --   12 weeks or 25 visits   Treatment/Interventions Language facilitation;Environmental controls;Cueing hierarchy;SLP instruction and feedback;Cognitive reorganization;Functional tasks;Compensatory strategies;Internal/external aids;Multimodal communcation approach;Patient/family education    Potential to Achieve Goals Fair    Potential Considerations Medical prognosis;Ability to learn/carryover information    SLP Home Exercise Plan aphasia education provided           Patient will benefit from skilled therapeutic intervention in order to improve the following deficits and impairments:   Aphasia    Problem List Patient Active Problem List   Diagnosis Date Noted  . 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/09/2017  . Hyperparathyroidism ([facility]) 03/09/2017  . Hamstring strain 10/22/2015  . Lateral epicondylitis of left elbow 06/28/2014  . Greater trochanteric bursitis of left hip 01/23/2014  . Lateral epicondylitis of right elbow 11/24/2011  . LOW BACK PAIN, ACUTE 10/18/2008    Caya Soberanis, [name] MS, [name] 05/03/2020, 1:27 PM  [facility] [address] [place], [place], [address] Phone: [phone]   Fax:  [phone]   Name: Jason Stephenson MRN: [id number] Date of  Birth: 01/28/1946

## 2020-05-03 NOTE — Patient Instructions (Addendum)
   Progressive Aphasia  Gestures  Writing  Drawing  Practice drawing and guessing   Charades  Pictionary  Win Loose or Draw  Keep adding to your personal words  Educate family that you need cues and extra time to participate in conversation  Practice names and topics before you get together and write them down for [name] to reference

## 2020-05-07 ENCOUNTER — Ambulatory Visit: Payer: Medicare HMO

## 2020-05-27 ENCOUNTER — Other Ambulatory Visit: Payer: Self-pay

## 2020-05-27 ENCOUNTER — Encounter: Payer: Self-pay | Admitting: Speech Pathology

## 2020-05-27 ENCOUNTER — Ambulatory Visit: Payer: Medicare Other | Attending: Neurology | Admitting: Speech Pathology

## 2020-05-27 DIAGNOSIS — R41841 Cognitive communication deficit: Secondary | ICD-10-CM | POA: Diagnosis not present

## 2020-05-27 DIAGNOSIS — R4701 Aphasia: Secondary | ICD-10-CM | POA: Insufficient documentation

## 2020-05-27 NOTE — Patient Instructions (Signed)
   Call [facility] 9-1-1 [phone] and let them know you have aphasia and may not be able to communicate easily in an emergency  Continue to use writing a few key words if [name] is having trouble understanding  Write choices if needed

## 2020-05-27 NOTE — Therapy (Signed)
[facility] [address] [place], [place], [address] Phone: [phone]   Fax:  [phone]  Speech Language Pathology Treatment  Patient Details  Name: Jason Stephenson MRN: [id number] Date of Birth: 1945-06-10 Referring Provider (SLP): Dr. Delice Lesch   Encounter Date: 05/27/2020   End of Session - 05/27/20 1114    Visit Number 7    Number of Visits 25    Date for SLP Re-Evaluation 07/12/20    Authorization Type [employer] [facility]    SLP Start Time 0848    SLP Stop Time  0931    SLP Time Calculation (min) 43 min    Activity Tolerance Patient tolerated treatment well           History reviewed. No pertinent past medical history.  Past Surgical History:  Procedure Laterality Date  . COLONOSCOPY    . parathyroid surgery  04/21/2017  . POLYPECTOMY      There were no vitals filed for this visit.   Subjective Assessment - 05/27/20 0850    Subjective "Gosh, this mask, I haven't had to put this thing on in eons"    Patient is accompained by: Family member   s/o, [place]   Currently in Pain? No/denies                 ADULT SLP TREATMENT - 05/27/20 0851      General Information   Behavior/Cognition Alert;Cooperative;Pleasant mood      Treatment Provided   Treatment provided Cognitive-Linquistic      Cognitive-Linquistic Treatment   Treatment focused on Aphasia;Patient/family/caregiver education    Skilled Treatment Continued generating a Visual merchandiser of personally relevant words (family and relations, friends, places, restaurants). [name] required usual semantic cues from [place] to ID 4 restaurants they frequent. Trained Education officer, museum in [employer] to use communication pages to help find words in conversation. [name] required frequent mod A to use family page in notebook to name daughter who came to viist them. He spontaneously used this page to tell 2 things about his brother and his wife after reading  their names. I added these to page.Provided aphasia ID cards and number to [facility] 9-1-1 so they can alert emergency services to [name] aphasia.      Assessment / Recommendations / Plan   Plan Continue with current plan of care      Progression Toward Goals   Progression toward goals Progressing toward goals            SLP Education - 05/27/20 1111    Education Details use writing to help Jason Stephenson understand topic, questions or choices; cue him to use Landscape architect) Educated Patient;Other (comment)   s/o [name]   Comprehension Verbalized understanding;Returned demonstration;Verbal cues required;Need further instruction              SLP Long Term Goals - 05/27/20 1113      SLP LONG TERM GOAL #1   Title Patient will use compensations for aphasia in 5-8 minutes simple-mod complex conversation with mod caregiver cues, x 3 visits.    Time 8    Period Weeks    Status On-going      SLP LONG TERM GOAL #2   Title Pt will complete standardized assessment of language.    Time 3    Period Weeks    Status Achieved      SLP LONG TERM GOAL #3   Title Patient/caregiver will demonstrate knowledge of appropriate activities/home program  for cognitive-linguistic skills.    Time 8    Period Weeks    Status On-going      SLP LONG TERM GOAL #4   Title Pt/caregiver will report using aphasia compensations or supportive conversations strategies in a communication breakdown outside [facility] room..    Time 8    Period Weeks    Status On-going            Plan - 05/27/20 1112    Clinical Impression Statement Moderate non fluent aphasia persists resulting in frequent frustration and communication break down with partner and friends. At this time, Jason Stephenson is completing IADL's with mod I. Ongoing training for compensations for aphasia and multimodal communication for pt's current commuication level and as aphasia progresses. Ongoing training of communication partners to  support [name] Recommend continue skilled ST to maximize communicatoin across settings for safety and QOL. Initiating low tech communication book to assist with finding family names, favorite places and activities. Encourage [name] to have his most frequent communication partners attend some ST sessions. Goals updated and extended, recertification for 8 more weeks submitted as ongoing development of AAC and partner training required.    Speech Therapy Frequency 2x / week    Duration 12 weeks   25 visits   Treatment/Interventions Language facilitation;Environmental controls;Cueing hierarchy;SLP instruction and feedback;Cognitive reorganization;Functional tasks;Compensatory strategies;Internal/external aids;Multimodal communcation approach;Patient/family education    Potential to Achieve Goals Good    Potential Considerations Medical prognosis           Patient will benefit from skilled therapeutic intervention in order to improve the following deficits and impairments:   Aphasia  Cognitive communication deficit    Problem List Patient Active Problem List   Diagnosis Date Noted  . 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/09/2017  . Hyperparathyroidism ([place]) 03/09/2017  . Hamstring strain 10/22/2015  . Lateral epicondylitis of left elbow 06/28/2014  . Greater trochanteric bursitis of left hip 01/23/2014  . Lateral epicondylitis of right elbow 11/24/2011  . LOW BACK PAIN, ACUTE 10/18/2008    Micky Sheller, [name] MS, C CC-SLP 05/27/2020, 11:14 AM  [facility] [address] [place], [place], [address] Phone: [phone]   Fax:  [phone]   Name: Jason Stephenson MRN: [id number] Date of Birth: 1945-06-18

## 2020-05-31 ENCOUNTER — Ambulatory Visit: Payer: Medicare Other | Admitting: Speech Pathology

## 2020-06-05 ENCOUNTER — Other Ambulatory Visit: Payer: Self-pay

## 2020-06-05 ENCOUNTER — Encounter: Payer: Self-pay | Admitting: Speech Pathology

## 2020-06-05 ENCOUNTER — Ambulatory Visit: Payer: Medicare Other | Admitting: Speech Pathology

## 2020-06-05 DIAGNOSIS — R4701 Aphasia: Secondary | ICD-10-CM | POA: Diagnosis not present

## 2020-06-05 DIAGNOSIS — R41841 Cognitive communication deficit: Secondary | ICD-10-CM

## 2020-06-05 NOTE — Therapy (Signed)
[facility] [address] [place], [place], [address] Phone: [phone]   Fax:  [phone]  Speech Language Pathology Treatment  Patient Details  Name: Jason Stephenson MRN: [id number] Date of Birth: 1946/03/26 Referring Provider (SLP): Dr. Delice Lesch   Encounter Date: 06/05/2020   End of Session - 06/05/20 0947    Visit Number 8    Number of Visits 25    Date for SLP Re-Evaluation 07/12/20    Authorization Type [employer] [facility]    SLP Start Time 0846    SLP Stop Time  0929    SLP Time Calculation (min) 43 min           History reviewed. No pertinent past medical history.  Past Surgical History:  Procedure Laterality Date  . COLONOSCOPY    . parathyroid surgery  04/21/2017  . POLYPECTOMY      There were no vitals filed for this visit.   Subjective Assessment - 06/05/20 0839    Subjective "I was positive last week so we couldn't be here"    Patient is accompained by: Family member   s/o Beverly   Currently in Pain? No/denies                 ADULT SLP TREATMENT - 06/05/20 0851      General Information   Behavior/Cognition Alert;Cooperative;Pleasant mood      Cognitive-Linquistic Treatment   Treatment focused on Aphasia;Patient/family/caregiver education    Skilled Treatment Ongoing generation of low tech AAC book to augment verbal expression. Added restaurants, places, and friends. Also added his name, address, phone number. role played stating this info as if he were at the doctor or pharmacy. Trained [employer] in [employer] to use the book when he can't think of a word. He used book to talk about his daughter, say her name and her job. In conversation, [name] used book to say [employer] they enjoyed recently.      Assessment / Recommendations / Plan   Plan Continue with current plan of care      Progression Toward Goals   Progression toward goals Progressing toward goals                 SLP Long Term Goals - 06/05/20 0935      SLP LONG TERM GOAL #1   Title Patient will use compensations for aphasia in 5-8 minutes simple-mod complex conversation with mod caregiver cues, x 3 visits.    Time 7    Period Weeks    Status On-going      SLP LONG TERM GOAL #2   Title Pt will complete standardized assessment of language.    Time 3    Period Weeks    Status Achieved      SLP LONG TERM GOAL #3   Title Patient/caregiver will demonstrate knowledge of appropriate activities/home program for cognitive-linguistic skills.    Time 7    Period Weeks    Status On-going      SLP LONG TERM GOAL #4   Title Pt/caregiver will report using aphasia compensations or supportive conversations strategies in a communication breakdown outside [place] room..    Time 7    Period Weeks    Status On-going            Plan - 06/05/20 0934    Clinical Impression Statement Moderate non fluent aphasia persists resulting in frequent frustration and communication break down with partner and friends. At this time, [name]  is completing IADL's with mod I. Ongoing training for compensations for aphasia and multimodal communication for pt's current commuication level and as aphasia progresses. Ongoing training of communication partners to support [name] Recommend continue skilled ST to maximize communicatoin across settings for safety and QOL. Initiating low tech communication book to assist with finding family names, favorite places and activities. Encourage [name] to have his most frequent communication partners attend some ST sessions. Goals updated and extended, recertification for 8 more weeks submitted as ongoing development of AAC and partner training required.    Speech Therapy Frequency 2x / week    Duration 12 weeks   25 visits   Treatment/Interventions Language facilitation;Environmental controls;Cueing hierarchy;SLP instruction and feedback;Cognitive reorganization;Functional  tasks;Compensatory strategies;Internal/external aids;Multimodal communcation approach;Patient/family education    Potential to Achieve Goals Good    Potential Considerations Medical prognosis    SLP Home Exercise Plan aphasia education provided    Consulted and Agree with Plan of Care Patient;Family member/caregiver           Patient will benefit from skilled therapeutic intervention in order to improve the following deficits and impairments:   Aphasia  Cognitive communication deficit    Problem List Patient Active Problem List   Diagnosis Date Noted  . 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/09/2017  . Hyperparathyroidism ([place]) 03/09/2017  . Hamstring strain 10/22/2015  . Lateral epicondylitis of left elbow 06/28/2014  . Greater trochanteric bursitis of left hip 01/23/2014  . Lateral epicondylitis of right elbow 11/24/2011  . LOW BACK PAIN, ACUTE 10/18/2008    Azia Toutant, [name] MS, [name] 06/05/2020, 9:48 AM  [facility] [address] [place], [place], [address] Phone: [phone]   Fax:  [phone]   Name: ERVEN RAMSON MRN: [id number] Date of Birth: December 17, 1945

## 2020-06-07 ENCOUNTER — Encounter: Payer: Self-pay | Admitting: Speech Pathology

## 2020-06-07 ENCOUNTER — Other Ambulatory Visit: Payer: Self-pay

## 2020-06-07 ENCOUNTER — Ambulatory Visit: Payer: Medicare Other | Admitting: Speech Pathology

## 2020-06-07 DIAGNOSIS — R4701 Aphasia: Secondary | ICD-10-CM

## 2020-06-07 DIAGNOSIS — R41841 Cognitive communication deficit: Secondary | ICD-10-CM | POA: Diagnosis not present

## 2020-06-07 NOTE — Therapy (Signed)
[facility] [address] [place], [place], [address] Phone: [phone]   Fax:  [phone]  Speech Language Pathology Treatment  Patient Details  Name: MERICK KELLEHER MRN: [id number] Date of Birth: 07-18-1945 Referring Provider (SLP): Dr. Delice Lesch   Encounter Date: 06/07/2020   End of Session - 06/07/20 1550    Visit Number 9    Number of Visits 25    Date for SLP Re-Evaluation 07/12/20    Authorization Type [employer]    SLP Start Time 6195    SLP Stop Time  0932    SLP Time Calculation (min) 46 min    Activity Tolerance Patient tolerated treatment well           History reviewed. No pertinent past medical history.  Past Surgical History:  Procedure Laterality Date  . COLONOSCOPY    . parathyroid surgery  04/21/2017  . POLYPECTOMY      There were no vitals filed for this visit.   Subjective Assessment - 06/07/20 1445    Subjective "He's gotten to where he won't use credit card, he carries large amounts of cash - I don't know why"    Patient is accompained by: Family member   s/o [place]   Currently in Pain? No/denies                 ADULT SLP TREATMENT - 06/07/20 1448      General Information   Behavior/Cognition Alert;Cooperative;Pleasant mood      Treatment Provided   Treatment provided Cognitive-Linquistic      Cognitive-Linquistic Treatment   Treatment focused on Aphasia;Patient/family/caregiver education    Skilled Geophysical data processor and [place] on using communication book. they have not used it since last session. Today, with min verbal cues, [name] was able to locate his grandson and the sport he played (in divergent naming task for sports) to verbalize hockey. Targeted word finiding of personally relevant words in divergent naming tasks (sports, swim terms/strokes, and presidents. [name] required frequent semantic, written and gestural cues to name 10 words for each  category. As I used gestures, I modeled this for pt and Beverly as meals of augmentative communication.      Assessment / Recommendations / Plan   Plan Continue with current plan of care      Progression Toward Goals   Progression toward goals Progressing toward goals            SLP Education - 06/07/20 1548    Education Details gestures, communication book to augment speech    Person(s) Educated Patient;Other (comment)   s/o Beverly   Methods Explanation;Demonstration;Verbal cues;Handout    Comprehension Verbalized understanding;Returned demonstration;Verbal cues required;Need further instruction              SLP Long Term Goals - 06/07/20 [place] #1   Title Patient will use compensations for aphasia in 5-8 minutes simple-mod complex conversation with mod caregiver cues, x 3 visits.    Time 7    Period Weeks    Status On-going      SLP LONG TERM GOAL #2   Title Pt will complete standardized assessment of language.    Time 3    Period Weeks    Status Achieved      SLP LONG TERM GOAL #3   Title Patient/caregiver will demonstrate knowledge of appropriate activities/home program for cognitive-linguistic skills.    Time 7  Period Weeks    Status On-going      SLP LONG TERM GOAL #4   Title Pt/caregiver will report using aphasia compensations or supportive conversations strategies in a communication breakdown outside [place] room..    Time 7    Period Weeks    Status On-going            Plan - 06/07/20 1549    Clinical Impression Statement Moderate non fluent aphasia persists resulting in frequent frustration and communication break down with partner and friends. At this time, [name] is completing IADL's with mod I. Ongoing training for compensations for aphasia and multimodal communication for pt's current commuication level and as aphasia progresses. Ongoing training of communication partners to support [name] Recommend continue  skilled ST to maximize communicatoin across settings for safety and QOL. Initiating low tech communication book to assist with finding family names, favorite places and activities. Encourage [name] to have his most frequent communication partners attend some ST sessions. Goals updated and extended, recertification for 8 more weeks submitted as ongoing development of AAC and partner training required.    Speech Therapy Frequency 2x / week    Duration 12 weeks   25 visits   Treatment/Interventions Language facilitation;Environmental controls;Cueing hierarchy;SLP instruction and feedback;Cognitive reorganization;Functional tasks;Compensatory strategies;Internal/external aids;Multimodal communcation approach;Patient/family education    Potential to Achieve Goals Good    Potential Considerations Medical prognosis           Patient will benefit from skilled therapeutic intervention in order to improve the following deficits and impairments:   Aphasia    Problem List Patient Active Problem List   Diagnosis Date Noted  . 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/09/2017  . Hyperparathyroidism ([place]) 03/09/2017  . Hamstring strain 10/22/2015  . Lateral epicondylitis of left elbow 06/28/2014  . Greater trochanteric bursitis of left hip 01/23/2014  . Lateral epicondylitis of right elbow 11/24/2011  . LOW BACK PAIN, ACUTE 10/18/2008    Donaldo Teegarden, [name] MS, [name] 06/07/2020, 3:51 PM  [facility] [address] [place], [place], [address] Phone: [phone]   Fax:  [phone]   Name: ALEXANDERJAMES BERG MRN: [id number] Date of Birth: 1945/10/12

## 2020-06-12 ENCOUNTER — Other Ambulatory Visit: Payer: Self-pay

## 2020-06-12 ENCOUNTER — Ambulatory Visit: Payer: Medicare Other | Admitting: Speech Pathology

## 2020-06-12 DIAGNOSIS — R4701 Aphasia: Secondary | ICD-10-CM

## 2020-06-12 DIAGNOSIS — R41841 Cognitive communication deficit: Secondary | ICD-10-CM | POA: Diagnosis not present

## 2020-06-12 NOTE — Therapy (Signed)
[facility] [address] [place], [place], [address] Phone: [phone]   Fax:  [phone]  Speech Language Pathology Treatment & discharge Summary  Patient Details  Name: Jason Stephenson MRN: [id number] Date of Birth: 1946-01-10 Referring Provider (SLP): Dr. Delice Lesch   Encounter Date: 06/12/2020   End of Session - 06/12/20 1407    Visit Number 10    Number of Visits 25    Date for SLP Re-Evaluation 07/12/20    Authorization Type [employer] M[employer]    SLP Start Time 6384    SLP Stop Time  1357    SLP Time Calculation (min) 42 min    Activity Tolerance Patient tolerated treatment well           No past medical history on file.  Past Surgical History:  Procedure Laterality Date  . COLONOSCOPY    . parathyroid surgery  04/21/2017  . POLYPECTOMY      There were no vitals filed for this visit.   Subjective Assessment - 06/12/20 1324    Subjective "Watching sports"    Patient is accompained by: Family member   [place]   Currently in Pain? No/denies                 ADULT SLP TREATMENT - 06/12/20 1325      General Information   Behavior/Cognition Cooperative;Alert;Pleasant mood      Treatment Provided   Treatment provided Cognitive-Linquistic      Pain Assessment   Pain Assessment No/denies pain      Cognitive-Linquistic Treatment   Treatment focused on Aphasia;Patient/family/caregiver education    Skilled Treatment Spoke about the importance of written cues to support verbal expression. Therapy focused on strategies for pre-planning conversations with written information to assist [name] when having word finding difficulty. Pt required verbal cues as well as extra time to navigate smart phone to find a contact during task. [name] agreed to assist with written cueing to assist [name] in making social calls.            SPEECH THERAPY DISCHARGE SUMMARY  Visits from Start of Care: 10  Current  functional level related to goals / functional outcomes: See goals below   Remaining deficits: Moderate non fluent aphasia   Education / Equipment: Compensations for aphasia and multimodal communication Plan: Patient agrees to discharge.  Patient goals were partially met. Patient is being discharged due to being pleased with the current functional level.  ?????          SLP Long Term Goals - 06/12/20 1331      SLP LONG TERM GOAL #1   Title Patient will use compensations for aphasia in 5-8 minutes simple-mod complex conversation with mod caregiver cues, x 3 visits.    Time 7    Period Weeks    Status Achieved      SLP LONG TERM GOAL #2   Title Pt will complete standardized assessment of language.    Time 3    Period Weeks    Status Achieved      SLP LONG TERM GOAL #3   Title Patient/caregiver will demonstrate knowledge of appropriate activities/home program for cognitive-linguistic skills.    Time 7    Period Weeks    Status Achieved      SLP LONG TERM GOAL #4   Title Pt/caregiver will report using aphasia compensations or supportive conversations strategies in a communication breakdown outside [place] room..    Time 7  Period Weeks    Status Partially Met            Plan - 06/12/20 1403    Clinical Impression Statement Moderate non fluent persists. Training [name] and Beverly in multimodal communication strategies including communication book and written cues. Instructed Beverly to write salient words when [name] is on the phone and to pre write sentences and phrases he needs to say for calls and conversations. [name] is requesting discharge today. I am in agrement. Goals partially met. Education complete.    Speech Therapy Frequency 2x / week    Duration 12 weeks   25 visits   Treatment/Interventions Language facilitation;Environmental controls;Cueing hierarchy;SLP instruction and feedback;Cognitive reorganization;Functional tasks;Compensatory  strategies;Internal/external aids;Multimodal communcation approach;Patient/family education    Potential to Achieve Goals Good    Potential Considerations Medical prognosis    SLP Home Exercise Plan aphasia education provided    Consulted and Agree with Plan of Care Family member/caregiver;Patient    Family Member Consulted Significant other, Beverly           Patient will benefit from skilled therapeutic intervention in order to improve the following deficits and impairments:   Aphasia    Problem List Patient Active Problem List   Diagnosis Date Noted  . 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/09/2017  . Hyperparathyroidism ([facility]) 03/09/2017  . Hamstring strain 10/22/2015  . Lateral epicondylitis of left elbow 06/28/2014  . Greater trochanteric bursitis of left hip 01/23/2014  . Lateral epicondylitis of right elbow 11/24/2011  . LOW BACK PAIN, ACUTE 10/18/2008    ,  Ann MS, [name] 06/12/2020, 2:08 PM  [facility] [address] [place], [place], [address] Phone: [phone]   Fax:  [phone]   Name: Jason Stephenson MRN: [id number] Date of Birth: 03/07/1946 

## 2020-06-17 ENCOUNTER — Ambulatory Visit: Payer: Medicare Other | Admitting: Speech Pathology

## 2020-06-19 ENCOUNTER — Ambulatory Visit: Payer: Medicare Other | Admitting: Speech Pathology

## 2020-06-21 ENCOUNTER — Encounter: Payer: Medicare HMO | Admitting: Speech Pathology

## 2020-07-01 ENCOUNTER — Encounter: Payer: Medicare Other | Admitting: Speech Pathology

## 2020-07-10 ENCOUNTER — Encounter: Payer: Medicare Other | Admitting: Speech Pathology

## 2020-07-12 ENCOUNTER — Encounter: Payer: Medicare Other | Admitting: Speech Pathology

## 2020-07-15 ENCOUNTER — Encounter: Payer: Medicare Other | Admitting: Speech Pathology

## 2020-07-30 DIAGNOSIS — D1801 Hemangioma of skin and subcutaneous tissue: Secondary | ICD-10-CM | POA: Diagnosis not present

## 2020-07-30 DIAGNOSIS — D225 Melanocytic nevi of trunk: Secondary | ICD-10-CM | POA: Diagnosis not present

## 2020-07-30 DIAGNOSIS — L821 Other seborrheic keratosis: Secondary | ICD-10-CM | POA: Diagnosis not present

## 2020-07-30 DIAGNOSIS — D2272 Melanocytic nevi of left lower limb, including hip: Secondary | ICD-10-CM | POA: Diagnosis not present

## 2020-09-22 NOTE — Progress Notes (Signed)
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 Discussed the diagnosis of dementia,likely due to Alzheimer's disease. . Discussed no further driving, review results of driving evaluation . Discussed safety both in and out of the home.  . Discussed the importance of regular daily schedule  . Continue to monitor mood. . Continue to stay active exercising  at least 30 minutes at least 3 times a week.  . Naps should be scheduled and should be no longer than 60 minutes and should not occur after 2 PM.  . Mediterranean diet is recommended  . Continue Donepezil 10 mg daily and Memantine 10 mg twice daily  . Follow up in 6 months.   Case discussed with Dr. Delice Lesch who agrees with the plan    Subjective:    Jason Stephenson is a 75 year old right-handed man with a history of hyperparathyroidism, peripheral neuropathy, moderate nonfluent aphasia who was seen today 09/23/2020 in follow up for memory loss.  He has a diagnosis of mild dementia, likely Alzheimer's.  This patient is accompanied in the office by his daughter Chrys Racer  who supplements the history. He now has a Economist, son [name], from [place].  Previous records as well as any outside records available were reviewed prior to todays visit.  Patient is currently on donepezil 10 mg daily, and memantine 10 mg twice daily, added during the last visit. Patient feels his memory is" a little shorter". Daughter reports that he has "more trouble finding words than before".  He lives with his girlfriend, who recently had an injury to her hip, and has mobility issues.  She is the main caretaker at this time, and the one administering his medications.  The patient walks independently, and does not get lost.  His mood is good, without depression or irritability.  Denies hallucinations or paranoia.  No vivid dreams are reported.  He sleeps about 8 hours, taking naps while watching sports.  He remains active,  and recently participated in senior games here in [place], especially in swimming, and long jump, as well as running track.   He dresses and bathes independently.  He denies leaving objects in unusual places.  He pays his bills, denies overt pain or missing payments.  He continues to drive short distances only, does marked to the pharmacy, denies getting lost.  Daughter at the visit agrees with the patient. Appetite is good denies any constipation or diarrhea, denies trouble swallowing.  He has been on speech therapy, and "doing very well".  The patient does not cook. Denies headaches, trauma, or injuries to the head, double vision, numbness or tingling or unilateral weakness. Denies tremors.  Denies urinary incontinence or retention.     "INITIAL HISTORY OF PRESENT ILLNESS: This is a 75 year old right-handed man with a history of hyperparathyroidism s/p parathyroidectomy, neuropathy, presenting for evaluation of dementia. He is accompanied by his friend of 69 years, [name], who helps provide additional information. He feels his memory is pretty good. He has been living with his girlfriend for the past 1.5 years. He denies getting lost driving, missing medications or bill payments. He reports his girlfriend does not need to remind him of medications. She does most of the cooking. [name] got back in touch with him when he moved back to [place] 3 years ago and noticed memory changes at that time. Over the past 2 years, the memory lapses have progressed a little more to where he gets a  little confused with issues and has trouble getting his sentences out. [name] has noticed he has difficulty composing a sentence, needing to stop and think. [name] has noticed he accumulates a lot of papers that they have tried to clean up, [name] thinks this is because he would forget and needs to look back on paperwork. [name] has helped him get plugged in with a PCP and arrange estate planning and [place]. [name] expressed  concern about his driving with recognizing stoplights and time. He repeatedly says he does well with driving. No hygiene concerns. He has been on Donepezil 10mg  daily for a year, no side effects. [name] has not noticed any personality changes, paranoia or hallucinations. He states his mood is okay. He exercises regularly in the [employer]. There is no family history of dementia, no history of significant head injuries. He drinks alcohol on occasion.   He denies any headaches, dizziness, diplopia, dysarthria, dysphagia, neck/back pain, focal numbness/tingling/weakness, bowel/bladder dysfunction. No anosmia, tremors, no falls. Sleep is good.   MRI brain with and without contrast done January 2020 no acute changes, there is moderate diffuse atrophy, minimal chronic microvascular disease  PREVIOUS MEDICATIONS:   CURRENT MEDICATIONS:  Outpatient Encounter Medications as of 09/23/2020  Medication Sig  . donepezil (ARICEPT) 10 MG tablet Take 10 mg by mouth at bedtime.  . finasteride (PROSCAR) 5 MG tablet Take 5 mg by mouth daily.  . memantine (NAMENDA) 10 MG tablet Take 1 tablet every night for 2 weeks, then increase to 1 tablet twice a day  . Nutritional Supplements (VITAMIN D MAINTENANCE PO) Take 1,000 mg by mouth.  . Potassium 99 MG TABS Take by mouth. (Patient not taking: Reported on 03/26/2020)  . tadalafil (CIALIS) 5 MG tablet Take 5 mg by mouth daily as needed for erectile dysfunction.  . tamsulosin (FLOMAX) 0.4 MG CAPS capsule    Facility-Administered Encounter Medications as of 09/23/2020  Medication  . 0.9 %  sodium chloride infusion     Objective:     PHYSICAL EXAMINATION:    VITALS:   Vitals:   09/23/20 1401  BP: (!) 162/75  Pulse: (!) 52  SpO2: 98%  Weight: 163 lb 3.2 oz (74 kg)  Height: 5\' 10"  (1.778 m)    GEN:  The patient appears stated age and is in NAD. HEENT:  Normocephalic, atraumatic.   Neurological examination:  Orientation: The patient is alert. Oriented  to person, place and year and month  cranial nerves: There is good facial symmetry.The speech is fluent at times, but other times he has word finding difficulties.  No aphasia or dysarthria. Fund of knowledge is reduced.  Recent and remote memory are impaired. Attention and concentration are reduced.  Hearing is intact to conversational tone.    Sensation: Sensation is intact to light touch throughout Motor: Strength is at least antigravity x4.  No flowsheet data found.   MMSE - Mini Mental State Exam 03/15/2020  Orientation to time 0  Orientation to Place 1  Registration 3  Attention/ Calculation 0  Recall 3  Language- name 2 objects 2  Language- repeat 0  Language- follow 3 step command 0  Language- read & follow direction 0  Write a sentence 0  Copy design 0  Total score 9      Movement examination: Tone: There is normal tone in the UE/LE Abnormal movements:  no tremor.  No myoclonus.  No asterixis.   Coordination:  There is no decremation with RAM's. .  Gait and  Station: The patient has no difficulty arising out of a deep-seated chair without the use of the hands. The patient's stride length is good.  Gait is cautious and narrow.      Total time spent on today's visit was 50  minutes, including both face-to-face time and nonface-to-face time.  Time included that spent on review of records (prior notes available to me/labs/imaging if pertinent), discussing treatment and goals, answering patient's questions and coordinating care.  Cc:  Ginger Organ., MD Sharene Butters, PA-C

## 2020-09-23 ENCOUNTER — Encounter: Payer: Self-pay | Admitting: Neurology

## 2020-09-23 ENCOUNTER — Ambulatory Visit: Payer: Medicare Other | Admitting: Neurology

## 2020-09-23 ENCOUNTER — Other Ambulatory Visit: Payer: Self-pay

## 2020-09-23 VITALS — BP 162/75 | HR 52 | Ht 70.0 in | Wt 163.2 lb

## 2020-09-23 DIAGNOSIS — F03A0 Unspecified dementia, mild, without behavioral disturbance, psychotic disturbance, mood disturbance, and anxiety: Secondary | ICD-10-CM

## 2020-09-23 DIAGNOSIS — F0390 Unspecified dementia without behavioral disturbance: Secondary | ICD-10-CM | POA: Diagnosis not present

## 2020-09-23 NOTE — Patient Instructions (Signed)
It was a pleasure to see you today at our office.   Recommendations:  Meds: Follow up in 6  months Continue Donepezil 10 mg daily and Namenda 10 mg twice daily  RECOMMENDATIONS FOR ALL PATIENTS WITH MEMORY PROBLEMS: 1. Continue to exercise (Recommend 30 minutes of walking everyday, or 3 hours every week) 2. Increase social interactions - continue going to [employer] and enjoy social gatherings with friends and family 3. Eat healthy, avoid fried foods and eat more fruits and vegetables 4. Maintain adequate blood pressure, blood sugar, and blood cholesterol level. Reducing the risk of stroke and cardiovascular disease also helps promoting better memory. 5. Avoid stressful situations. Live a simple life and avoid aggravations. Organize your time and prepare for the next day in anticipation. 6. Sleep well, avoid any interruptions of sleep and avoid any distractions in the bedroom that may interfere with adequate sleep quality 7. Avoid sugar, avoid sweets as there is a strong link between excessive sugar intake, diabetes, and cognitive impairment We discussed the Mediterranean diet, which has been shown to help patients reduce the risk of progressive memory disorders and reduces cardiovascular risk. This includes eating fish, eat fruits and green leafy vegetables, nuts like almonds and hazelnuts, walnuts, and also use olive oil. Avoid fast foods and fried foods as much as possible. Avoid sweets and sugar as sugar use has been linked to worsening of memory function.  There is always a concern of gradual progression of memory problems. If this is the case, then we may need to adjust level of care according to patient needs. Support, both to the patient and caregiver, should then be put into place.    The Alzheimer's Association is here all day, every day for people facing Alzheimer's disease through our free 24/7 Helpline: [phone]. The Helpline provides reliable information and support to all those  who need assistance, such as individuals living with memory loss, Alzheimer's or other dementia, caregivers, health care professionals and the public.  Our highly trained and knowledgeable staff can help you with: Understanding memory loss, dementia and Alzheimer's  Medications and other treatment options  General information about aging and brain health  Skills to provide quality care and to find the best care from professionals  Legal, financial and living-arrangement decisions Our Helpline also features: Confidential care consultation provided by master's level clinicians who can help with decision-making support, crisis assistance and education on issues families face every day  Help in a caller's preferred language using our translation service that features more than 200 languages and dialects  Referrals to local community programs, services and ongoing support     FALL PRECAUTIONS: Be cautious when walking. Scan the area for obstacles that may increase the risk of trips and falls. When getting up in the mornings, sit up at the edge of the bed for a few minutes before getting out of bed. Consider elevating the bed at the head end to avoid drop of blood pressure when getting up. Walk always in a well-lit room (use night lights in the walls). Avoid area rugs or power cords from appliances in the middle of the walkways. Use a walker or a cane if necessary and consider physical therapy for balance exercise. Get your eyesight checked regularly.  FINANCIAL OVERSIGHT: Supervision, especially oversight when making financial decisions or transactions is also recommended.  HOME SAFETY: Consider the safety of the kitchen when operating appliances like stoves, microwave oven, and blender. Consider having supervision and share cooking responsibilities until no longer   able to participate in those. Accidents with firearms and other hazards in the house should be identified and addressed as  well.   ABILITY TO BE LEFT ALONE: If patient is unable to contact 911 operator, consider using LifeLine, or when the need is there, arrange for someone to stay with patients. Smoking is a fire hazard, consider supervision or cessation. Risk of wandering should be assessed by caregiver and if detected at any point, supervision and safe proof recommendations should be instituted.  MEDICATION SUPERVISION: Inability to self-administer medication needs to be constantly addressed. Implement a mechanism to ensure safe administration of the medications.   DRIVING: Regarding driving, in patients with progressive memory problems, driving will be impaired. We advise to have someone else do the driving if trouble finding directions or if minor accidents are reported. Independent driving assessment is available to determine safety of driving.   If you are interested in the driving assessment, you can contact the following:  [employer] in [place] [phone]  [facility] [phone]  [facility] [phone]  [facility] [phone] or [phone]   Mediterranean Diet A Mediterranean diet refers to food and lifestyle choices that are based on the traditions of countries located on the [place]. This way of eating has been shown to help prevent certain conditions and improve outcomes for people who have chronic diseases, like kidney disease and heart disease. What are tips for following this plan? Lifestyle  Cook and eat meals together with your family, when possible. Drink enough fluid to keep your urine clear or pale yellow. Be physically active every day. This includes: Aerobic exercise like running or swimming. Leisure activities like gardening, walking, or housework. Get 7-8 hours of sleep each night. If recommended by your health care provider, drink red wine in moderation. This means 1 glass a day for nonpregnant women and 2  glasses a day for men. A glass of wine equals 5 oz (150 mL). Reading food labels  Check the serving size of packaged foods. For foods such as rice and pasta, the serving size refers to the amount of cooked product, not dry. Check the total fat in packaged foods. Avoid foods that have saturated fat or trans fats. Check the ingredients list for added sugars, such as corn syrup. Shopping  At the grocery store, buy most of your food from the areas near the walls of the store. This includes: Fresh fruits and vegetables (produce). Grains, beans, nuts, and seeds. Some of these may be available in unpackaged forms or large amounts (in bulk). Fresh seafood. Poultry and eggs. Low-fat dairy products. Buy whole ingredients instead of prepackaged foods. Buy fresh fruits and vegetables in-season from local farmers markets. Buy frozen fruits and vegetables in resealable bags. If you do not have access to quality fresh seafood, buy precooked frozen shrimp or canned fish, such as tuna, salmon, or sardines. Buy small amounts of raw or cooked vegetables, salads, or olives from the deli or salad bar at your store. Stock your pantry so you always have certain foods on hand, such as olive oil, canned tuna, canned tomatoes, rice, pasta, and beans. Cooking  Cook foods with extra-virgin olive oil instead of using butter or other vegetable oils. Have meat as a side dish, and have vegetables or grains as your main dish. This means having meat in small portions or adding small amounts of meat to foods like pasta or stew. Use beans or vegetables instead of meat in common dishes like   chili or lasagna. Experiment with different cooking methods. Try roasting or broiling vegetables instead of steaming or sauteing them. Add frozen vegetables to soups, stews, pasta, or rice. Add nuts or seeds for added healthy fat at each meal. You can add these to yogurt, salads, or vegetable dishes. Marinate fish or vegetables using olive  oil, lemon juice, garlic, and fresh herbs. Meal planning  Plan to eat 1 vegetarian meal one day each week. Try to work up to 2 vegetarian meals, if possible. Eat seafood 2 or more times a week. Have healthy snacks readily available, such as: Vegetable sticks with hummus. Greek yogurt. Fruit and nut trail mix. Eat balanced meals throughout the week. This includes: Fruit: 2-3 servings a day Vegetables: 4-5 servings a day Low-fat dairy: 2 servings a day Fish, poultry, or lean meat: 1 serving a day Beans and legumes: 2 or more servings a week Nuts and seeds: 1-2 servings a day Whole grains: 6-8 servings a day Extra-virgin olive oil: 3-4 servings a day Limit red meat and sweets to only a few servings a month What are my food choices? Mediterranean diet Recommended Grains: Whole-grain pasta. Brown rice. Bulgar wheat. Polenta. Couscous. Whole-wheat bread. Oatmeal. Quinoa. Vegetables: Artichokes. Beets. Broccoli. Cabbage. Carrots. Eggplant. Green beans. Chard. Kale. Spinach. Onions. Leeks. Peas. Squash. Tomatoes. Peppers. Radishes. Fruits: Apples. Apricots. Avocado. Berries. Bananas. Cherries. Dates. Figs. Grapes. Lemons. Melon. Oranges. Peaches. Plums. Pomegranate. Meats and other protein foods: Beans. Almonds. Sunflower seeds. Pine nuts. Peanuts. Cod. Salmon. Scallops. Shrimp. Tuna. Tilapia. Clams. Oysters. Eggs. Dairy: Low-fat milk. Cheese. Greek yogurt. Beverages: Water. Red wine. Herbal tea. Fats and oils: Extra virgin olive oil. Avocado oil. Grape seed oil. Sweets and desserts: Greek yogurt with honey. Baked apples. Poached pears. Trail mix. Seasoning and other foods: Basil. Cilantro. Coriander. Cumin. Mint. Parsley. Sage. Rosemary. Tarragon. Garlic. Oregano. Thyme. Pepper. Balsalmic vinegar. Tahini. Hummus. Tomato sauce. Olives. Mushrooms. Limit these Grains: Prepackaged pasta or rice dishes. Prepackaged cereal with added sugar. Vegetables: Deep fried potatoes (french fries). Fruits:  Fruit canned in syrup. Meats and other protein foods: Beef. Pork. Lamb. Poultry with skin. Hot dogs. Bacon. Dairy: Ice cream. Sour cream. Whole milk. Beverages: Juice. Sugar-sweetened soft drinks. Beer. Liquor and spirits. Fats and oils: Butter. Canola oil. Vegetable oil. Beef fat (tallow). Lard. Sweets and desserts: Cookies. Cakes. Pies. Candy. Seasoning and other foods: Mayonnaise. Premade sauces and marinades. The items listed may not be a complete list. Talk with your dietitian about what dietary choices are right for you. Summary The Mediterranean diet includes both food and lifestyle choices. Eat a variety of fresh fruits and vegetables, beans, nuts, seeds, and whole grains. Limit the amount of red meat and sweets that you eat. Talk with your health care provider about whether it is safe for you to drink red wine in moderation. This means 1 glass a day for nonpregnant women and 2 glasses a day for men. A glass of wine equals 5 oz (150 mL). This information is not intended to replace advice given to you by your health care provider. Make sure you discuss any questions you have with your health care provider. Document Released: 12/26/2015 Document Revised: 01/28/2016 Document Reviewed: 12/26/2015 Elsevier Interactive Patient Education  2017 Elsevier Inc.       health care provider. Make sure you discuss any questions you have with your health care provider. Document Released: 12/26/2015 Document Revised: 01/28/2016 Document Reviewed: 12/26/2015 Elsevier Interactive Patient Education  2017 [employer].

## 2020-09-23 NOTE — Progress Notes (Signed)
Patient was seen, evaluated, and treatment plan was discussed with the Advanced Practice Provider. Discussed recommendation for driving evaluation, Jason Stephenson this has been done and Jason Stephenson was told Jason Stephenson cannot drive. 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 Jason Stephenson had impaired comprehension for Trail Making Test and abnormal clock drawing. All these made him ineligible to perform behind the wheel assessment and recommendation to cease all driving privileges. Discussed increasing supervision at home with aides to help him and Jason Stephenson. Jason Stephenson has minimal insight into Jason condition. Jason Stephenson expressed understanding. Continue speech therapy, Donepezil, and Memantine. I have also reviewed the orders written for this patient which were under my direction. I agree with the findings and the plan of care as documented by the Advanced Practice Provider.

## 2020-11-11 DIAGNOSIS — H5211 Myopia, right eye: Secondary | ICD-10-CM | POA: Diagnosis not present

## 2021-01-16 DIAGNOSIS — Z125 Encounter for screening for malignant neoplasm of prostate: Secondary | ICD-10-CM | POA: Diagnosis not present

## 2021-01-16 DIAGNOSIS — Z0000 Encounter for general adult medical examination without abnormal findings: Secondary | ICD-10-CM | POA: Diagnosis not present

## 2021-01-16 DIAGNOSIS — M810 Age-related osteoporosis without current pathological fracture: Secondary | ICD-10-CM | POA: Diagnosis not present

## 2021-01-16 DIAGNOSIS — G629 Polyneuropathy, unspecified: Secondary | ICD-10-CM | POA: Diagnosis not present

## 2021-01-23 DIAGNOSIS — M810 Age-related osteoporosis without current pathological fracture: Secondary | ICD-10-CM | POA: Diagnosis not present

## 2021-03-27 ENCOUNTER — Ambulatory Visit: Payer: Medicare Other | Admitting: Physician Assistant

## 2021-03-27 ENCOUNTER — Other Ambulatory Visit: Payer: Self-pay

## 2021-03-27 ENCOUNTER — Encounter: Payer: Self-pay | Admitting: Physician Assistant

## 2021-03-27 VITALS — BP 176/99 | HR 60 | Resp 18 | Ht 70.0 in | Wt 165.0 lb

## 2021-03-27 DIAGNOSIS — G309 Alzheimer's disease, unspecified: Secondary | ICD-10-CM | POA: Diagnosis not present

## 2021-03-27 DIAGNOSIS — F0280 Dementia in other diseases classified elsewhere without behavioral disturbance: Secondary | ICD-10-CM

## 2021-03-27 MED ORDER — DONEPEZIL HCL 10 MG PO TABS: 10.0000 mg | ORAL_TABLET | Freq: Every day | ORAL | 11 refills | Status: DC

## 2021-03-27 MED ORDER — MEMANTINE HCL 10 MG PO TABS: ORAL_TABLET | ORAL | 11 refills | Status: DC

## 2021-03-27 NOTE — Patient Instructions (Signed)
It was a pleasure to see you today at our office.   Recommendations:  Meds: Follow up in 6  months Continue Donepezil 10 mg daily and Namenda 10 mg twice daily  RECOMMENDATIONS FOR ALL PATIENTS WITH MEMORY PROBLEMS: 1. Continue to exercise (Recommend 30 minutes of walking everyday, or 3 hours every week) 2. Increase social interactions - continue going to [place] and enjoy social gatherings with friends and family 3. Eat healthy, avoid fried foods and eat more fruits and vegetables 4. Maintain adequate blood pressure, blood sugar, and blood cholesterol level. Reducing the risk of stroke and cardiovascular disease also helps promoting better memory. 5. Avoid stressful situations. Live a simple life and avoid aggravations. Organize your time and prepare for the next day in anticipation. 6. Sleep well, avoid any interruptions of sleep and avoid any distractions in the bedroom that may interfere with adequate sleep quality 7. Avoid sugar, avoid sweets as there is a strong link between excessive sugar intake, diabetes, and cognitive impairment We discussed the Mediterranean diet, which has been shown to help patients reduce the risk of progressive memory disorders and reduces cardiovascular risk. This includes eating fish, eat fruits and green leafy vegetables, nuts like almonds and hazelnuts, walnuts, and also use olive oil. Avoid fast foods and fried foods as much as possible. Avoid sweets and sugar as sugar use has been linked to worsening of memory function.  There is always a concern of gradual progression of memory problems. If this is the case, then we may need to adjust level of care according to patient needs. Support, both to the patient and caregiver, should then be put into place.    The Alzheimer's Association is here all day, every day for people facing Alzheimer's disease through our free 24/7 Helpline: [phone]. The Helpline provides reliable information and support to all those  who need assistance, such as individuals living with memory loss, Alzheimer's or other dementia, caregivers, health care professionals and the public.  Our highly trained and knowledgeable staff can help you with: Understanding memory loss, dementia and Alzheimer's  Medications and other treatment options  General information about aging and brain health  Skills to provide quality care and to find the best care from professionals  Legal, financial and living-arrangement decisions Our Helpline also features: Confidential care consultation provided by master's level clinicians who can help with decision-making support, crisis assistance and education on issues families face every day  Help in a caller's preferred language using our translation service that features more than 200 languages and dialects  Referrals to local community programs, services and ongoing support     FALL PRECAUTIONS: Be cautious when walking. Scan the area for obstacles that may increase the risk of trips and falls. When getting up in the mornings, sit up at the edge of the bed for a few minutes before getting out of bed. Consider elevating the bed at the head end to avoid drop of blood pressure when getting up. Walk always in a well-lit room (use night lights in the walls). Avoid area rugs or power cords from appliances in the middle of the walkways. Use a walker or a cane if necessary and consider physical therapy for balance exercise. Get your eyesight checked regularly.  FINANCIAL OVERSIGHT: Supervision, especially oversight when making financial decisions or transactions is also recommended.  HOME SAFETY: Consider the safety of the kitchen when operating appliances like stoves, microwave oven, and blender. Consider having supervision and share cooking responsibilities until no longer  able to participate in those. Accidents with firearms and other hazards in the house should be identified and addressed as  well.   ABILITY TO BE LEFT ALONE: If patient is unable to contact 911 operator, consider using LifeLine, or when the need is there, arrange for someone to stay with patients. Smoking is a fire hazard, consider supervision or cessation. Risk of wandering should be assessed by caregiver and if detected at any point, supervision and safe proof recommendations should be instituted.  MEDICATION SUPERVISION: Inability to self-administer medication needs to be constantly addressed. Implement a mechanism to ensure safe administration of the medications.   DRIVING: Regarding driving, in patients with progressive memory problems, driving will be impaired. We advise to have someone else do the driving if trouble finding directions or if minor accidents are reported. Independent driving assessment is available to determine safety of driving.   If you are interested in the driving assessment, you can contact the following:  [employer] in [place]  [place] [phone] or [phone]   [place] refers to food and lifestyle choices that are based on the traditions of countries located on the [employer]. This way of eating has been shown to help prevent certain conditions and improve outcomes for people who have chronic diseases, like kidney disease and heart disease. What are tips for following this plan? Lifestyle  Cook and eat meals together with your family, when possible. Drink enough fluid to keep your urine clear or pale yellow. Be physically active every day. This includes: Aerobic exercise like running or swimming. Leisure activities like gardening, walking, or housework. Get 7-8 hours of sleep each night. If recommended by your health care provider, drink red wine in moderation. This means 1 glass a day for nonpregnant women and 2  glasses a day for men. A glass of wine equals 5 oz (150 mL). Reading food labels  Check the serving size of packaged foods. For foods such as rice and pasta, the serving size refers to the amount of cooked product, not dry. Check the total fat in packaged foods. Avoid foods that have saturated fat or trans fats. Check the ingredients list for added sugars, such as corn syrup. Shopping  At the grocery store, buy most of your food from the areas near the walls of the store. This includes: Fresh fruits and vegetables (produce). Grains, beans, nuts, and seeds. Some of these may be available in unpackaged forms or large amounts (in bulk). Fresh seafood. Poultry and eggs. Low-fat dairy products. Buy whole ingredients instead of prepackaged foods. Buy fresh fruits and vegetables in-season from local farmers markets. Buy frozen fruits and vegetables in resealable bags. If you do not have access to quality fresh seafood, buy precooked frozen shrimp or canned fish, such as tuna, salmon, or sardines. Buy small amounts of raw or cooked vegetables, salads, or olives from the deli or salad bar at your store. Stock your pantry so you always have certain foods on hand, such as olive oil, canned tuna, canned tomatoes, rice, pasta, and beans. Cooking  Cook foods with extra-virgin olive oil instead of using butter or other vegetable oils. Have meat as a side dish, and have vegetables or grains as your main dish. This means having meat in small portions or adding small amounts of meat to foods like pasta or stew. Use beans or vegetables instead of meat in common dishes like  chili or lasagna. Experiment with different cooking methods. Try roasting or broiling vegetables instead of steaming or sauteing them. Add frozen vegetables to soups, stews, pasta, or rice. Add nuts or seeds for added healthy fat at each meal. You can add these to yogurt, salads, or vegetable dishes. Marinate fish or vegetables using olive  oil, lemon juice, garlic, and fresh herbs. Meal planning  Plan to eat 1 vegetarian meal one day each week. Try to work up to 2 vegetarian meals, if possible. Eat seafood 2 or more times a week. Have healthy snacks readily available, such as: Vegetable sticks with hummus. Greek yogurt. Fruit and nut trail mix. Eat balanced meals throughout the week. This includes: Fruit: 2-3 servings a day Vegetables: 4-5 servings a day Low-fat dairy: 2 servings a day Fish, poultry, or lean meat: 1 serving a day Beans and legumes: 2 or more servings a week Nuts and seeds: 1-2 servings a day Whole grains: 6-8 servings a day Extra-virgin olive oil: 3-4 servings a day Limit red meat and sweets to only a few servings a month What are my food choices? Mediterranean diet Recommended Grains: Whole-grain pasta. Brown rice. Bulgar wheat. Polenta. Couscous. Whole-wheat bread. [place]. Vegetables: Artichokes. Beets. Broccoli. Cabbage. Carrots. Eggplant. Green beans. Chard. Kale. Spinach. Onions. Leeks. Peas. Squash. Tomatoes. Peppers. Radishes. Fruits: Apples. Apricots. Avocado. Berries. Bananas. Cherries. Dates. Figs. Grapes. Lemons. Melon. Oranges. Peaches. Plums. Pomegranate. Meats and other protein foods: Beans. Almonds. Sunflower seeds. Pine nuts. Peanuts. [place]. Salmon. Scallops. Shrimp. [place]. Tilapia. Clams. Oysters. Eggs. Dairy: Low-fat milk. Cheese. Greek yogurt. Beverages: Water. Red wine. Herbal tea. Fats and oils: Extra virgin olive oil. Avocado oil. Grape seed oil. Sweets and desserts: [place] yogurt with honey. Baked apples. Poached pears. Trail mix. Seasoning and other foods: Basil. Cilantro. Coriander. Cumin. Mint. Parsley. Sage. Rosemary. Tarragon. Garlic. Oregano. Thyme. Pepper. Balsalmic vinegar. Tahini. Hummus. Tomato sauce. Olives. Mushrooms. Limit these Grains: Prepackaged pasta or rice dishes. Prepackaged cereal with added sugar. Vegetables: Deep fried potatoes (french fries). Fruits:  Fruit canned in syrup. Meats and other protein foods: Beef. Pork. Lamb. Poultry with skin. Hot dogs. [place] Salines. Dairy: Ice cream. Sour cream. Whole milk. Beverages: Juice. Sugar-sweetened soft drinks. Beer. Liquor and spirits. Fats and oils: Butter. Canola oil. Vegetable oil. Beef fat (tallow). Lard. Sweets and desserts: Cookies. Cakes. Pies. Candy. Seasoning and other foods: Mayonnaise. Premade sauces and marinades. The items listed may not be a complete list. Talk with your dietitian about what dietary choices are right for you. Summary The Mediterranean diet includes both food and lifestyle choices. Eat a variety of fresh fruits and vegetables, beans, nuts, seeds, and whole grains. Limit the amount of red meat and sweets that you eat. Talk with your health care provider about whether it is safe for you to drink red wine in moderation. This means 1 glass a day for nonpregnant women and 2 glasses a day for men. A glass of wine equals 5 oz (150 mL). This information is not intended to replace advice given to you by your health care provider. Make sure you discuss any questions you have with your health care provider. Document Released: 12/26/2015 Document Revised: 01/28/2016 Document Reviewed: 12/26/2015 Elsevier Interactive Patient Education  2017 [employer].

## 2021-03-27 NOTE — Progress Notes (Signed)
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 Discussed no further driving, review results of driving evaluation Discussed safety both in and out of the home.  Discussed the importance of regular daily schedule  Continue to monitor mood. Continue to stay active exercising  at least 30 minutes at least 3 times a week.  Naps should be scheduled and should be no longer than 60 minutes and should not occur after 2 PM.  Mediterranean diet is recommended  Continue Donepezil 10 mg daily and Memantine 10 mg twice daily  Follow up in 6 months.   Case discussed with Dr. Delice Lesch who agrees with the plan    Subjective:    Jason Stephenson is a 75 year old right-handed man with a history of hyperparathyroidism, peripheral neuropathy, moderate nonfluent aphasia who was seen today 09/23/2020 in follow up for memory loss.  He has a diagnosis of mild dementia, likely Alzheimer's.  This patient is accompanied in the office by his girlfriend who supports the history. His HCPOA, is son Jason Stephenson, from [place].  Previous records as well as any outside records available were reviewed prior to todays visit.  Patient is currently on donepezil 10 mg daily, and memantine 10 mg twice daily, tolerating well. Patient feels his memory is about the same, but his girlfriend reports that his long-term memory may be slightly worse, however with good guidance, he is able to retrieve.  He lives with her, and she is the main caretaker at this time, and the one administering his medications.  The patient walks independently, and does not get lost.  His mood is good, without depression or irritability.  Denies hallucinations or paranoia.  No vivid dreams or REM behavior are reported.  He sleeps about 8 hours, taking naps while watching sports.  He remains active, and likes to participate in senior games in  [place], especially in swimming, and long jump, as well as running track.  He also enjoys playing golf.  He dresses and bathes independently.  He denies leaving objects in unusual places.  He pays his bills, denies over paying or missing payments.  He continues to drive short distances only such as to the market or to the pharmacy, denies getting lost.  Appetite is good denies any constipation or diarrhea, denies trouble swallowing.  He has been on speech therapy in the past, and "doing very well".  The patient does not cook. Denies headaches, trauma, or injuries to the head, double vision, numbness or tingling or unilateral weakness. Denies tremors.  Denies urinary incontinence or retention.  Denies any falls.  "INITIAL HISTORY OF PRESENT ILLNESS: This is a 75 year old right-handed man with a history of hyperparathyroidism s/p parathyroidectomy, neuropathy, presenting for evaluation of dementia. He is accompanied by his friend of 93 years, Jason Stephenson, who helps provide additional information. He feels his memory is pretty good. He has been living with his girlfriend for the past 1.5 years. He denies getting lost driving, missing medications or bill payments. He reports his girlfriend does not need to remind him of medications. She does most of the cooking. [name] got back in touch with him when he moved back to [place] 3 years ago and noticed memory changes at that time. Over the past 2 years, the memory lapses have progressed a little more to where he gets a little confused with issues and has trouble getting his sentences out.  [name] has noticed he has difficulty composing a sentence, needing to stop and think. [name] has noticed he accumulates a lot of papers that they have tried to clean up, [name] thinks this is because he would forget and needs to look back on paperwork. [name] has helped him get plugged in with a PCP and arrange estate planning and [place]. [name] expressed concern about his driving with  recognizing stoplights and time. He repeatedly says he does well with driving. No hygiene concerns. He has been on Donepezil 10mg  daily for a year, no side effects. [name] has not noticed any personality changes, paranoia or hallucinations. He states his mood is okay. He exercises regularly in the [employer]. There is no family history of dementia, no history of significant head injuries. He drinks alcohol on occasion.    He denies any headaches, dizziness, diplopia, dysarthria, dysphagia, neck/back pain, focal numbness/tingling/weakness, bowel/bladder dysfunction. No anosmia, tremors, no falls. Sleep is good.    MRI brain with and without contrast done January 2020 no acute changes, there is moderate diffuse atrophy, minimal chronic microvascular disease  PREVIOUS MEDICATIONS:   CURRENT MEDICATIONS:  Outpatient Encounter Medications as of 03/27/2021  Medication Sig   finasteride (PROSCAR) 5 MG tablet Take 5 mg by mouth daily.   Nutritional Supplements (VITAMIN D MAINTENANCE PO) Take 1,000 mg by mouth.   Potassium 99 MG TABS Take by mouth.   tadalafil (CIALIS) 5 MG tablet Take 5 mg by mouth daily as needed for erectile dysfunction.   tamsulosin (FLOMAX) 0.4 MG CAPS capsule    [DISCONTINUED] donepezil (ARICEPT) 10 MG tablet Take 10 mg by mouth at bedtime.   [DISCONTINUED] memantine (NAMENDA) 10 MG tablet Take 1 tablet every night for 2 weeks, then increase to 1 tablet twice a day   donepezil (ARICEPT) 10 MG tablet Take 1 tablet (10 mg total) by mouth at bedtime.   memantine (NAMENDA) 10 MG tablet Take 1 tablet  twice a day   Facility-Administered Encounter Medications as of 03/27/2021  Medication   0.9 %  sodium chloride infusion     Objective:     PHYSICAL EXAMINATION:    VITALS:   Vitals:   03/27/21 1300  BP: (!) 176/99  Pulse: 60  Resp: 18  SpO2: 97%  Weight: 165 lb (74.8 kg)  Height: 5\' 10"  (1.778 m)     GEN:  The patient appears stated age and is in NAD. HEENT:   Normocephalic, atraumatic.   Neurological examination:  Orientation: The patient is alert. Oriented to person, place and year and month  cranial nerves: There is good facial symmetry.The speech is fluent at times, but other times he has word finding difficulties.  No aphasia or dysarthria. Fund of knowledge is reduced.  Recent and remote memory are impaired. Attention and concentration are reduced.  Hearing is intact to conversational tone.    Sensation: Sensation is intact to light touch throughout Motor: Strength is at least antigravity x4.  No flowsheet data found.   MMSE - Mini Mental State Exam 03/15/2020  Orientation to time 0  Orientation to Place 1  Registration 3  Attention/ Calculation 0  Recall 3  Language- name 2 objects 2  Language- repeat 0  Language- follow 3 step command 0  Language- read & follow direction 0  Write a sentence 0  Copy design 0  Total score 9      Movement examination: Tone: There is normal tone in the UE/LE Abnormal movements:  no tremor.  No myoclonus.  No asterixis.   Coordination:  There is no decremation with RAM's. .He has slight difficulty comprehending Finger to nose instructions.   Gait and Station: The patient has no difficulty arising out of a deep-seated chair without the use of the hands. The patient's stride length is good.  Gait is cautious and narrow.      Total time spent on today's visit was 30  minutes, including both face-to-face time and nonface-to-face time.  Time included that spent on review of records (prior notes available to me/labs/imaging if pertinent), discussing treatment and goals, answering patient's questions and coordinating care.  Cc:  Ginger Organ., MD Sharene Butters, PA-C

## 2021-05-01 DIAGNOSIS — N5201 Erectile dysfunction due to arterial insufficiency: Secondary | ICD-10-CM | POA: Diagnosis not present

## 2021-05-01 DIAGNOSIS — R3915 Urgency of urination: Secondary | ICD-10-CM | POA: Diagnosis not present

## 2021-07-30 DIAGNOSIS — L814 Other melanin hyperpigmentation: Secondary | ICD-10-CM | POA: Diagnosis not present

## 2021-07-30 DIAGNOSIS — L570 Actinic keratosis: Secondary | ICD-10-CM | POA: Diagnosis not present

## 2021-07-30 DIAGNOSIS — L821 Other seborrheic keratosis: Secondary | ICD-10-CM | POA: Diagnosis not present

## 2021-07-30 DIAGNOSIS — D225 Melanocytic nevi of trunk: Secondary | ICD-10-CM | POA: Diagnosis not present

## 2021-07-30 DIAGNOSIS — D1801 Hemangioma of skin and subcutaneous tissue: Secondary | ICD-10-CM | POA: Diagnosis not present

## 2021-08-05 ENCOUNTER — Telehealth: Payer: Self-pay | Admitting: Physician Assistant

## 2021-08-05 NOTE — Telephone Encounter (Signed)
Patients girlfriend called and stated they have questions about the medication memantine that he is taking.  She stated that his dreams are getting really bad and vivid.  They wanted to know if there is something else available. ?

## 2021-08-05 NOTE — Telephone Encounter (Signed)
Patient is not allergic to depakote, can you please send in new rx. Pharmacy is updated and note too, will patient continue mematine as well with new medicaton?   ?

## 2021-08-06 ENCOUNTER — Other Ambulatory Visit: Payer: Self-pay | Admitting: Physician Assistant

## 2021-08-06 ENCOUNTER — Telehealth: Payer: Self-pay | Admitting: Physician Assistant

## 2021-08-06 MED ORDER — DIVALPROEX SODIUM 125 MG PO DR TAB: 125.0000 mg | DELAYED_RELEASE_TABLET | Freq: Every day | ORAL | 3 refills | Status: DC

## 2021-08-06 NOTE — Progress Notes (Signed)
Sent Depakote 125 mg nightly for mood control. To [place]  ?

## 2021-08-06 NOTE — Telephone Encounter (Signed)
Patient to report back in a week for update on medications.  ?

## 2021-08-06 NOTE — Telephone Encounter (Signed)
Patients girlfriend called back and didn't know if he should still take the memantine.  He didn't take it last night.   ?

## 2021-08-12 NOTE — Telephone Encounter (Signed)
Fatigued and back pain, in general just doesn't feel well, I asked her to call his Primary care physican. She asked,"me to let you know and see if this is what you recommend." No fever, no pains urinary or abdominal pain.` ?

## 2021-08-12 NOTE — Telephone Encounter (Signed)
Patient's significant other Bethena Roys called back and left another message requesting a call back about the patient and medication questions. ?

## 2021-08-13 DIAGNOSIS — M545 Low back pain, unspecified: Secondary | ICD-10-CM | POA: Diagnosis not present

## 2021-08-13 DIAGNOSIS — F03B18 Unspecified dementia, moderate, with other behavioral disturbance: Secondary | ICD-10-CM | POA: Diagnosis not present

## 2021-08-13 DIAGNOSIS — R5383 Other fatigue: Secondary | ICD-10-CM | POA: Diagnosis not present

## 2021-08-19 DIAGNOSIS — N1831 Chronic kidney disease, stage 3a: Secondary | ICD-10-CM | POA: Diagnosis not present

## 2021-08-19 DIAGNOSIS — F02A0 Dementia in other diseases classified elsewhere, mild, without behavioral disturbance, psychotic disturbance, mood disturbance, and anxiety: Secondary | ICD-10-CM | POA: Diagnosis not present

## 2021-08-19 DIAGNOSIS — G629 Polyneuropathy, unspecified: Secondary | ICD-10-CM | POA: Diagnosis not present

## 2021-08-19 DIAGNOSIS — M810 Age-related osteoporosis without current pathological fracture: Secondary | ICD-10-CM | POA: Diagnosis not present

## 2021-09-23 ENCOUNTER — Encounter: Payer: Self-pay | Admitting: Neurology

## 2021-09-23 ENCOUNTER — Telehealth: Payer: Self-pay | Admitting: Neurology

## 2021-09-23 NOTE — Telephone Encounter (Signed)
Spoke to [employer]. They met with his estate attorney and concerns were expressed so he called John. He does not have a handle on his finances. He has a hard time putting together a sentence, says he wants to take money from his account. They were at the patient's home, laptop was so old, could not find his credit card. They talked about writing a check, gave $4.05 instead of $450 dollars. He can sign but he cannot write them. [name] thinks they are doing bills jointly, but a lot is drafted from his account. Patient asked about 5x to get money to get money out of his IRA, he really did not need it, they did it online, put in a check in his checking account. He still stays pretty active. He is independent with dressing and bathing. Discussed that he should not be driving. ?

## 2021-09-23 NOTE — Telephone Encounter (Signed)
Patient's son Arnaldo Natal sent paperwork naming him as HCPOA, requesting opinion about his mental competency on his financial affairs. Discussed his concerns, patient has asked his friend [name] to do things/log on to his account randomly that do not make sense, ie asking him to get $100K from his [employer]. Most of his finances are on autodraft, he has Fish farm manager, 2 pensions, and several 401Ks, and [name] wants to monitor things to make sure his father is protected and has money if needed in the future for ALF/SNF if needed. He got married to his girlfriend recently and family would like to protect him, patient went to his estate attorney 2 weeks after the marriage and could not give his wife's name.  ? ?Tried to call his friend [name] to gather more information as well, left VM ?

## 2021-09-24 ENCOUNTER — Ambulatory Visit: Payer: Medicare Other | Admitting: Physician Assistant

## 2021-09-28 ENCOUNTER — Ambulatory Visit
Admission: EM | Admit: 2021-09-28 | Discharge: 2021-09-28 | Disposition: A | Discharge status: Home / Self Care | Payer: Medicare Other | Attending: Urgent Care | Admitting: Urgent Care

## 2021-09-28 DIAGNOSIS — A084 Viral intestinal infection, unspecified: Secondary | ICD-10-CM

## 2021-09-28 DIAGNOSIS — R112 Nausea with vomiting, unspecified: Secondary | ICD-10-CM

## 2021-09-28 DIAGNOSIS — K5790 Diverticulosis of intestine, part unspecified, without perforation or abscess without bleeding: Secondary | ICD-10-CM

## 2021-09-28 MED ORDER — ONDANSETRON 8 MG PO TBDP: 8.0000 mg | ORAL_TABLET | Freq: Three times a day (TID) | ORAL | 0 refills | Status: DC | PRN

## 2021-09-28 NOTE — ED Provider Notes (Signed)
?[place] ? ? ?MRN: [id number] DOB: 1946-04-16 ? ?Subjective:  ? ?Jason Stephenson is a 76 y.o. male presenting for 2-day history of acute onset persistent and worsening nausea with vomiting, belly cramping.  No fever, chest pain, shortness of breath, bloody stools, diarrhea.  Patient has a history of diverticulosis as seen on chart review from his colonoscopy that he had in 2019.  No history of diverticulitis. ? ? ?Current Facility-Administered Medications:  ?  0.9 %  sodium chloride infusion, 500 mL, Intravenous, Once, Ladene Artist, MD ? ?Current Outpatient Medications:  ?  divalproex (DEPAKOTE) 125 MG DR tablet, Take 1 tablet (125 mg total) by mouth at bedtime., Disp: 30 tablet, Rfl: 3 ?  donepezil (ARICEPT) 10 MG tablet, Take 1 tablet (10 mg total) by mouth at bedtime., Disp: 30 tablet, Rfl: 11 ?  finasteride (PROSCAR) 5 MG tablet, Take 5 mg by mouth daily., Disp: , Rfl:  ?  memantine (NAMENDA) 10 MG tablet, Take 1 tablet  twice a day, Disp: 60 tablet, Rfl: 11 ?  Nutritional Supplements (VITAMIN D MAINTENANCE PO), Take 1,000 mg by mouth., Disp: , Rfl:  ?  Potassium 99 MG TABS, Take by mouth., Disp: , Rfl:  ?  tadalafil (CIALIS) 5 MG tablet, Take 5 mg by mouth daily as needed for erectile dysfunction., Disp: , Rfl:  ?  tamsulosin (FLOMAX) 0.4 MG CAPS capsule, , Disp: , Rfl:   ? ?No Known Allergies ? ?No past medical history on file.  ? ?Past Surgical History:  ?Procedure Laterality Date  ? COLONOSCOPY    ? parathyroid surgery  04/21/2017  ? POLYPECTOMY    ? ? ?Family History  ?Problem Relation Age of Onset  ? Stroke Father   ? Colon cancer Neg Hx   ? Colon polyps Neg Hx   ? Esophageal cancer Neg Hx   ? Rectal cancer Neg Hx   ? Stomach cancer Neg Hx   ? ? ?Social History  ? ?Tobacco Use  ? Smoking status: Former  ? Smokeless tobacco: Never  ?Vaping Use  ? Vaping Use: Never used  ?Substance Use Topics  ? Alcohol use: Yes  ?  Comment: moderate   ? Drug use: No   ? ? ?ROS ? ? ?Objective:  ? ?Vitals: ?BP 119/75 (BP Location: Right Arm)   Pulse 82   Temp 98.1 ?F (36.7 ?C) (Oral)   Resp 18   SpO2 95%  ? ?Physical Exam ?Constitutional:   ?   General: He is not in acute distress. ?   Appearance: Normal appearance. He is well-developed and normal weight. He is not ill-appearing, toxic-appearing or diaphoretic.  ?HENT:  ?   Head: Normocephalic and atraumatic.  ?   Right Ear: External ear normal.  ?   Left Ear: External ear normal.  ?   Nose: Nose normal.  ?   Mouth/Throat:  ?   Mouth: Mucous membranes are moist.  ?   Pharynx: Oropharynx is clear.  ?Eyes:  ?   General: No scleral icterus.    ?   Right eye: No discharge.     ?   Left eye: No discharge.  ?   Extraocular Movements: Extraocular movements intact.  ?Cardiovascular:  ?   Rate and Rhythm: Normal rate and regular rhythm.  ?   Heart sounds: Normal heart sounds. No murmur heard. ?  No friction rub. No gallop.  ?Pulmonary:  ?   Effort: Pulmonary effort is normal. No respiratory  distress.  ?   Breath sounds: Normal breath sounds. No stridor. No wheezing, rhonchi or rales.  ?Abdominal:  ?   General: Bowel sounds are increased. There is no distension.  ?   Palpations: Abdomen is soft. There is no mass.  ?   Tenderness: There is abdominal tenderness (mild, left flank side as well) in the left upper quadrant and left lower quadrant. There is no right CVA tenderness, left CVA tenderness, guarding or rebound.  ?Musculoskeletal:  ?   Cervical back: Normal range of motion.  ?Neurological:  ?   Mental Status: He is alert and oriented to person, place, and time.  ?Psychiatric:     ?   Mood and Affect: Mood normal.     ?   Behavior: Behavior normal.     ?   Thought Content: Thought content normal.     ?   Judgment: Judgment normal.  ? ? ?Assessment and Plan :  ? ?PDMP not reviewed this encounter. ? ?1. Viral gastroenteritis   ?2. Nausea and vomiting, unspecified vomiting type   ?3. Diverticulosis   ? ?Low suspicion for an acute  abdomen.  He does have mild tenderness over the left side of his abdomen but in the absence of bloody stools, fever and severe abdominal pain recommended avoiding ER for now for CT scan.  Will manage for suspected viral gastroenteritis with supportive care.  Recommended patient hydrate well, eat light meals and maintain electrolytes.  Will use Zofran and Imodium for nausea, vomiting and diarrhea. Counseled patient on potential for adverse effects with medications prescribed/recommended today, ER and return-to-clinic precautions discussed, patient verbalized understanding. ? ?  ?Jaynee Eagles, PA-C ?09/28/21 1428 ? ?

## 2021-09-28 NOTE — Discharge Instructions (Signed)

## 2021-09-28 NOTE — ED Triage Notes (Signed)
Pt c/o abd cramping for two nights, today he had nausea and vomiting. The vomiting happened today after eating. ?

## 2021-09-30 ENCOUNTER — Ambulatory Visit: Payer: Medicare Other | Admitting: Physician Assistant

## 2021-10-07 ENCOUNTER — Emergency Department (HOSPITAL_COMMUNITY): Payer: Medicare Other

## 2021-10-07 ENCOUNTER — Inpatient Hospital Stay (HOSPITAL_COMMUNITY)
Admission: EM | Admit: 2021-10-07 | Discharge: 2021-10-16 | DRG: 330 | Disposition: A | Discharge status: Home / Self Care | Payer: Medicare Other | Attending: Internal Medicine | Admitting: Internal Medicine

## 2021-10-07 ENCOUNTER — Other Ambulatory Visit: Payer: Self-pay

## 2021-10-07 DIAGNOSIS — R9431 Abnormal electrocardiogram [ECG] [EKG]: Secondary | ICD-10-CM | POA: Diagnosis not present

## 2021-10-07 DIAGNOSIS — Z4682 Encounter for fitting and adjustment of non-vascular catheter: Secondary | ICD-10-CM | POA: Diagnosis not present

## 2021-10-07 DIAGNOSIS — K660 Peritoneal adhesions (postprocedural) (postinfection): Secondary | ICD-10-CM | POA: Diagnosis not present

## 2021-10-07 DIAGNOSIS — Z781 Physical restraint status: Secondary | ICD-10-CM | POA: Diagnosis not present

## 2021-10-07 DIAGNOSIS — I10 Essential (primary) hypertension: Secondary | ICD-10-CM | POA: Diagnosis not present

## 2021-10-07 DIAGNOSIS — K55029 Acute infarction of small intestine, extent unspecified: Secondary | ICD-10-CM | POA: Diagnosis not present

## 2021-10-07 DIAGNOSIS — F03918 Unspecified dementia, unspecified severity, with other behavioral disturbance: Secondary | ICD-10-CM | POA: Diagnosis present

## 2021-10-07 DIAGNOSIS — K5700 Diverticulitis of small intestine with perforation and abscess without bleeding: Secondary | ICD-10-CM | POA: Diagnosis not present

## 2021-10-07 DIAGNOSIS — Z823 Family history of stroke: Secondary | ICD-10-CM

## 2021-10-07 DIAGNOSIS — Z86010 Personal history of colonic polyps: Secondary | ICD-10-CM

## 2021-10-07 DIAGNOSIS — Z87891 Personal history of nicotine dependence: Secondary | ICD-10-CM

## 2021-10-07 DIAGNOSIS — K56600 Partial intestinal obstruction, unspecified as to cause: Principal | ICD-10-CM | POA: Diagnosis present

## 2021-10-07 DIAGNOSIS — K56609 Unspecified intestinal obstruction, unspecified as to partial versus complete obstruction: Secondary | ICD-10-CM | POA: Diagnosis not present

## 2021-10-07 DIAGNOSIS — K529 Noninfective gastroenteritis and colitis, unspecified: Secondary | ICD-10-CM | POA: Diagnosis not present

## 2021-10-07 DIAGNOSIS — F05 Delirium due to known physiological condition: Secondary | ICD-10-CM | POA: Diagnosis not present

## 2021-10-07 DIAGNOSIS — N281 Cyst of kidney, acquired: Secondary | ICD-10-CM | POA: Diagnosis not present

## 2021-10-07 DIAGNOSIS — F0280 Dementia in other diseases classified elsewhere without behavioral disturbance: Secondary | ICD-10-CM | POA: Diagnosis not present

## 2021-10-07 DIAGNOSIS — R3129 Other microscopic hematuria: Secondary | ICD-10-CM | POA: Diagnosis not present

## 2021-10-07 DIAGNOSIS — C179 Malignant neoplasm of small intestine, unspecified: Secondary | ICD-10-CM | POA: Diagnosis not present

## 2021-10-07 DIAGNOSIS — F0390 Unspecified dementia without behavioral disturbance: Secondary | ICD-10-CM | POA: Diagnosis present

## 2021-10-07 DIAGNOSIS — Z79899 Other long term (current) drug therapy: Secondary | ICD-10-CM | POA: Diagnosis not present

## 2021-10-07 DIAGNOSIS — G309 Alzheimer's disease, unspecified: Secondary | ICD-10-CM | POA: Diagnosis not present

## 2021-10-07 DIAGNOSIS — R4701 Aphasia: Secondary | ICD-10-CM | POA: Diagnosis not present

## 2021-10-07 DIAGNOSIS — N1831 Chronic kidney disease, stage 3a: Secondary | ICD-10-CM | POA: Diagnosis present

## 2021-10-07 DIAGNOSIS — J9811 Atelectasis: Secondary | ICD-10-CM | POA: Diagnosis not present

## 2021-10-07 DIAGNOSIS — K6389 Other specified diseases of intestine: Secondary | ICD-10-CM | POA: Diagnosis not present

## 2021-10-07 DIAGNOSIS — R188 Other ascites: Secondary | ICD-10-CM | POA: Diagnosis not present

## 2021-10-07 DIAGNOSIS — N3289 Other specified disorders of bladder: Secondary | ICD-10-CM | POA: Diagnosis not present

## 2021-10-07 DIAGNOSIS — R1084 Generalized abdominal pain: Secondary | ICD-10-CM | POA: Diagnosis not present

## 2021-10-07 DIAGNOSIS — R509 Fever, unspecified: Secondary | ICD-10-CM | POA: Diagnosis not present

## 2021-10-07 DIAGNOSIS — Z4659 Encounter for fitting and adjustment of other gastrointestinal appliance and device: Secondary | ICD-10-CM | POA: Diagnosis not present

## 2021-10-07 DIAGNOSIS — K5651 Intestinal adhesions [bands], with partial obstruction: Secondary | ICD-10-CM | POA: Diagnosis not present

## 2021-10-07 DIAGNOSIS — Z9049 Acquired absence of other specified parts of digestive tract: Secondary | ICD-10-CM | POA: Diagnosis not present

## 2021-10-07 DIAGNOSIS — K658 Other peritonitis: Secondary | ICD-10-CM | POA: Diagnosis not present

## 2021-10-07 DIAGNOSIS — K5669 Other partial intestinal obstruction: Secondary | ICD-10-CM | POA: Diagnosis not present

## 2021-10-07 LAB — CBC WITH DIFFERENTIAL/PLATELET
Abs Immature Granulocytes: 0.06 10*3/uL (ref 0.00–0.07)
Basophils Absolute: 0 10*3/uL (ref 0.0–0.1)
Basophils Relative: 0 %
Eosinophils Absolute: 0.2 10*3/uL (ref 0.0–0.5)
Eosinophils Relative: 2 %
HCT: 45.6 % (ref 39.0–52.0)
Hemoglobin: 15.9 g/dL (ref 13.0–17.0)
Immature Granulocytes: 1 %
Lymphocytes Relative: 8 %
Lymphs Abs: 0.8 10*3/uL (ref 0.7–4.0)
MCH: 32 pg (ref 26.0–34.0)
MCHC: 34.9 g/dL (ref 30.0–36.0)
MCV: 91.8 fL (ref 80.0–100.0)
Monocytes Absolute: 0.5 10*3/uL (ref 0.1–1.0)
Monocytes Relative: 5 %
Neutro Abs: 8.2 10*3/uL — ABNORMAL HIGH (ref 1.7–7.7)
Neutrophils Relative %: 84 %
Platelets: 400 10*3/uL (ref 150–400)
RBC: 4.97 MIL/uL (ref 4.22–5.81)
RDW: 12 % (ref 11.5–15.5)
WBC: 9.7 10*3/uL (ref 4.0–10.5)
nRBC: 0 % (ref 0.0–0.2)

## 2021-10-07 MED ORDER — SODIUM CHLORIDE 0.9 % IV BOLUS (SEPSIS)
1000.0000 mL | Freq: Once | INTRAVENOUS | Status: AC
  Administered 2021-10-07: 1000 mL via INTRAVENOUS

## 2021-10-07 MED ORDER — ONDANSETRON HCL 4 MG/2ML IJ SOLN
4.0000 mg | Freq: Once | INTRAMUSCULAR | Status: AC
  Administered 2021-10-07: 4 mg via INTRAVENOUS
  Filled 2021-10-07: qty 2

## 2021-10-07 MED ORDER — FENTANYL CITRATE PF 50 MCG/ML IJ SOSY
50.0000 ug | PREFILLED_SYRINGE | Freq: Once | INTRAMUSCULAR | Status: AC
  Administered 2021-10-07: 50 ug via INTRAVENOUS
  Filled 2021-10-07: qty 1

## 2021-10-07 NOTE — ED Triage Notes (Addendum)
BIB EMS from home for abd pain here last week for same dx with gastroenteritis states this pain is worse than before. Pt is confused wife states he has Aphasia and it's worse when he is sick.

## 2021-10-07 NOTE — ED Provider Notes (Signed)
[facility] DEPT Provider Note   CSN: [id number] Arrival date & time: 10/07/21  2254     History {Add pertinent medical, surgical, social history, OB history to HPI:1} Chief Complaint  Patient presents with   Abdominal Pain    Jason Stephenson is a 76 y.o. male.  The history is provided by the patient, the spouse and medical records.  Abdominal Pain Jason Stephenson is a 76 y.o. male who presents to the Emergency Department complaining of abdominal pain.  He presents to the emergency department accompanied by his wife for evaluation of severe abdominal pain that started around 630 this evening.  She reports associated shaking chills.  He has associated nausea but no vomiting.  Did have 2 loose stools today.  He was evaluated earlier in the week was diagnosed with gastroenteritis.  Level 5 caveat due to patient's baseline aphasia.  No reports of fevers at home but they were unable to check.  No prior abdominal surgeries.  Patient does report dysuria.    Home Medications Prior to Admission medications   Medication Sig Start Date End Date Taking? Authorizing Provider  divalproex (DEPAKOTE) 125 MG DR tablet Take 1 tablet (125 mg total) by mouth at bedtime. 08/06/21   Rondel Jumbo, PA-C  donepezil (ARICEPT) 10 MG tablet Take 1 tablet (10 mg total) by mouth at bedtime. 03/27/21   Rondel Jumbo, PA-C  finasteride (PROSCAR) 5 MG tablet Take 5 mg by mouth daily.    [provider]  memantine (NAMENDA) 10 MG tablet Take 1 tablet  twice a day 03/27/21   Rondel Jumbo, PA-C  Nutritional Supplements (VITAMIN D MAINTENANCE PO) Take 1,000 mg by mouth.    [provider]  ondansetron (ZOFRAN-ODT) 8 MG disintegrating tablet Take 1 tablet (8 mg total) by mouth every 8 (eight) hours as needed for nausea or vomiting. 09/28/21   Jaynee Eagles, PA-C  Potassium 99 MG TABS Take by mouth.    [provider]  tadalafil (CIALIS) 5 MG tablet  Take 5 mg by mouth daily as needed for erectile dysfunction.    [provider]  tamsulosin (FLOMAX) 0.4 MG CAPS capsule  09/27/15   [provider]      Allergies    Patient has no known allergies.    Review of Systems   Review of Systems  Gastrointestinal:  Positive for abdominal pain.  All other systems reviewed and are negative.  Physical Exam Updated Vital Signs BP (!) 158/77 (BP Location: Left Arm)   Pulse 98   Temp 99.4 F (37.4 C) (Oral)   Resp 18   Ht '5\' 10"'$  (1.778 m)   Wt 74.8 kg   SpO2 94%   BMI 23.66 kg/m  Physical Exam Vitals and nursing note reviewed.  Constitutional:      Appearance: He is well-developed.  HENT:     Head: Normocephalic and atraumatic.  Cardiovascular:     Rate and Rhythm: Normal rate and regular rhythm.  Pulmonary:     Effort: Pulmonary effort is normal. No respiratory distress.  Abdominal:     Palpations: Abdomen is soft.     Tenderness: There is no guarding or rebound.     Comments: Moderate generalized abdominal tenderness with voluntary guarding  Musculoskeletal:        General: No swelling or tenderness.  Skin:    General: Skin is warm and dry.  Neurological:     Mental Status: He is alert.  Comments: Expressive aphasia.  Moves all extremities symmetrically and strongly.  Psychiatric:        Behavior: Behavior normal.    ED Results / Procedures / Treatments   Labs (all labs ordered are listed, but only abnormal results are displayed) Labs Reviewed - No data to display  EKG None  Radiology No results found.  Procedures Procedures  {Document cardiac monitor, telemetry assessment procedure when appropriate:1}  Medications Ordered in ED Medications  sodium chloride 0.9 % bolus 1,000 mL (has no administration in time range)    ED Course/ Medical Decision Making/ A&P                           Medical Decision Making Amount and/or Complexity of Data Reviewed Labs: ordered. Radiology:  ordered. ECG/medicine tests: ordered.   ***  {Document critical care time when appropriate:1} {Document review of labs and clinical decision tools ie heart score, Chads2Vasc2 etc:1}  {Document your independent review of radiology images, and any outside records:1} {Document your discussion with family members, caretakers, and with consultants:1} {Document social determinants of health affecting pt's care:1} {Document your decision making why or why not admission, treatments were needed:1} Final Clinical Impression(s) / ED Diagnoses Final diagnoses:  None    Rx / DC Orders ED Discharge Orders     None

## 2021-10-08 ENCOUNTER — Emergency Department (HOSPITAL_COMMUNITY): Payer: Medicare Other

## 2021-10-08 ENCOUNTER — Encounter (HOSPITAL_COMMUNITY): Payer: Self-pay

## 2021-10-08 DIAGNOSIS — F0280 Dementia in other diseases classified elsewhere without behavioral disturbance: Secondary | ICD-10-CM | POA: Diagnosis not present

## 2021-10-08 DIAGNOSIS — K56600 Partial intestinal obstruction, unspecified as to cause: Principal | ICD-10-CM

## 2021-10-08 DIAGNOSIS — F0390 Unspecified dementia without behavioral disturbance: Secondary | ICD-10-CM

## 2021-10-08 DIAGNOSIS — N1831 Chronic kidney disease, stage 3a: Secondary | ICD-10-CM

## 2021-10-08 DIAGNOSIS — G309 Alzheimer's disease, unspecified: Secondary | ICD-10-CM

## 2021-10-08 DIAGNOSIS — F03918 Unspecified dementia, unspecified severity, with other behavioral disturbance: Secondary | ICD-10-CM | POA: Diagnosis present

## 2021-10-08 HISTORY — DX: Unspecified dementia, unspecified severity, without behavioral disturbance, psychotic disturbance, mood disturbance, and anxiety: F03.90

## 2021-10-08 LAB — PROTIME-INR
INR: 1.1 (ref 0.8–1.2)
Prothrombin Time: 13.8 seconds (ref 11.4–15.2)

## 2021-10-08 LAB — BASIC METABOLIC PANEL
Anion gap: 8 (ref 5–15)
BUN: 20 mg/dL (ref 8–23)
CO2: 27 mmol/L (ref 22–32)
Calcium: 8.6 mg/dL — ABNORMAL LOW (ref 8.9–10.3)
Chloride: 104 mmol/L (ref 98–111)
Creatinine, Ser: 1.39 mg/dL — ABNORMAL HIGH (ref 0.61–1.24)
GFR, Estimated: 53 mL/min — ABNORMAL LOW (ref >60)
Glucose, Bld: 123 mg/dL — ABNORMAL HIGH (ref 70–99)
Potassium: 4.2 mmol/L (ref 3.5–5.1)
Sodium: 139 mmol/L (ref 135–145)

## 2021-10-08 LAB — URINALYSIS, ROUTINE W REFLEX MICROSCOPIC
Bacteria, UA: NONE SEEN
Bilirubin Urine: NEGATIVE
Glucose, UA: NEGATIVE mg/dL
Ketones, ur: 5 mg/dL — AB
Leukocytes,Ua: NEGATIVE
Nitrite: NEGATIVE
Protein, ur: NEGATIVE mg/dL
Specific Gravity, Urine: 1.03 (ref 1.005–1.030)
pH: 6 (ref 5.0–8.0)

## 2021-10-08 LAB — COMPREHENSIVE METABOLIC PANEL
ALT: 23 U/L (ref 0–44)
AST: 21 U/L (ref 15–41)
Albumin: 3.3 g/dL — ABNORMAL LOW (ref 3.5–5.0)
Alkaline Phosphatase: 109 U/L (ref 38–126)
Anion gap: 7 (ref 5–15)
BUN: 20 mg/dL (ref 8–23)
CO2: 26 mmol/L (ref 22–32)
Calcium: 8.5 mg/dL — ABNORMAL LOW (ref 8.9–10.3)
Chloride: 105 mmol/L (ref 98–111)
Creatinine, Ser: 1.38 mg/dL — ABNORMAL HIGH (ref 0.61–1.24)
GFR, Estimated: 53 mL/min — ABNORMAL LOW (ref >60)
Glucose, Bld: 106 mg/dL — ABNORMAL HIGH (ref 70–99)
Potassium: 3.6 mmol/L (ref 3.5–5.1)
Sodium: 138 mmol/L (ref 135–145)
Total Bilirubin: 0.7 mg/dL (ref 0.3–1.2)
Total Protein: 6.7 g/dL (ref 6.5–8.1)

## 2021-10-08 LAB — CBC
HCT: 40.9 % (ref 39.0–52.0)
Hemoglobin: 14.4 g/dL (ref 13.0–17.0)
MCH: 32.3 pg (ref 26.0–34.0)
MCHC: 35.2 g/dL (ref 30.0–36.0)
MCV: 91.7 fL (ref 80.0–100.0)
Platelets: 368 10*3/uL (ref 150–400)
RBC: 4.46 MIL/uL (ref 4.22–5.81)
RDW: 12 % (ref 11.5–15.5)
WBC: 17.1 10*3/uL — ABNORMAL HIGH (ref 4.0–10.5)
nRBC: 0 % (ref 0.0–0.2)

## 2021-10-08 LAB — LIPASE, BLOOD: Lipase: 31 U/L (ref 11–51)

## 2021-10-08 LAB — APTT: aPTT: 29 seconds (ref 24–36)

## 2021-10-08 LAB — LACTIC ACID, PLASMA: Lactic Acid, Venous: 0.9 mmol/L (ref 0.5–1.9)

## 2021-10-08 LAB — MAGNESIUM: Magnesium: 2.1 mg/dL (ref 1.7–2.4)

## 2021-10-08 MED ORDER — MORPHINE SULFATE (PF) 2 MG/ML IV SOLN
1.0000 mg | INTRAVENOUS | Status: DC | PRN
  Administered 2021-10-08 – 2021-10-11 (×8): 1 mg via INTRAVENOUS
  Filled 2021-10-08 (×7): qty 1

## 2021-10-08 MED ORDER — MORPHINE SULFATE (PF) 2 MG/ML IV SOLN
1.0000 mg | INTRAVENOUS | Status: DC | PRN
  Administered 2021-10-08: 1 mg via INTRAVENOUS
  Filled 2021-10-08: qty 1

## 2021-10-08 MED ORDER — ENOXAPARIN SODIUM 40 MG/0.4ML IJ SOSY: 40.0000 mg | PREFILLED_SYRINGE | INTRAMUSCULAR | Status: DC

## 2021-10-08 MED ORDER — FENTANYL CITRATE PF 50 MCG/ML IJ SOSY: 25.0000 ug | PREFILLED_SYRINGE | INTRAMUSCULAR | Status: DC | PRN

## 2021-10-08 MED ORDER — LACTATED RINGERS IV SOLN: INTRAVENOUS | Status: DC

## 2021-10-08 MED ORDER — SODIUM CHLORIDE (PF) 0.9 % IJ SOLN
INTRAMUSCULAR | Status: AC
  Filled 2021-10-08: qty 50

## 2021-10-08 MED ORDER — ONDANSETRON HCL 4 MG/2ML IJ SOLN: 4.0000 mg | Freq: Four times a day (QID) | INTRAMUSCULAR | Status: DC | PRN

## 2021-10-08 MED ORDER — LACTATED RINGERS IV SOLN
INTRAVENOUS | Status: DC
  Filled 2021-10-08: qty 1000

## 2021-10-08 MED ORDER — POTASSIUM CHLORIDE 2 MEQ/ML IV SOLN
INTRAVENOUS | Status: AC
  Filled 2021-10-08: qty 1000

## 2021-10-08 MED ORDER — IOHEXOL 300 MG/ML  SOLN
100.0000 mL | Freq: Once | INTRAMUSCULAR | Status: AC | PRN
  Administered 2021-10-08: 100 mL via INTRAVENOUS

## 2021-10-08 NOTE — H&P (Signed)
History and Physical    Jason Stephenson:[id number] DOB: 17-Aug-1945 DOA: 10/07/2021  PCP: Ginger Organ., MD   Patient coming from: Home   Chief Complaint: Abdominal pain   HPI: Jason Stephenson is a pleasant 76 y.o. male with medical history significant for dementia who presents to the emergency department for evaluation of abdominal pain.  He is accompanied by his wife who assists with the history.  Patient had been seen at urgent care on 09/28/2021 with 2 days of abdominal pain, nausea, and vomiting.  He was suspected to have a viral gastroenteritis at the time and seemed to be back in his usual state by 10/01/2021.  He had some brief abdominal discomfort a couple days later that was self-limited but then developed severe pain while eating chicken and rice for dinner last night.  Per his wife, he appeared to be in excruciating pain, complained of nausea, but did not vomit.  His wife gave him an Imodium, but is not sure if he had any diarrhea and the patient does not remember.  With severe pain persisting, he was brought into the ED for evaluation.    He denies any history of abdominal surgeries.  He had a colonoscopy in 2019 with polyp removed from transverse colon (tubular adenoma; no high-grade dysplasia or malignancy).  ED Course: Upon arrival to the ED, patient is found to be afebrile with normal heart rate and systolic blood pressure 175Z.  Lactic acid and WBC are normal.  Lipase normal.  Creatinine 1.38, similar to 2019.  CT demonstrates multiple dilated loops of small bowel with gradual transition distally concerning for partial small bowel obstruction.  Patient was given a liter of saline and Zofran in the ED.  Review of Systems:  ROS limited by the patient's clinical condition.  Past Medical History:  Diagnosis Date   Dementia ([place]) 10/08/2021    Past Surgical History:  Procedure Laterality Date   COLONOSCOPY     parathyroid surgery  04/21/2017   POLYPECTOMY       Social History:   reports that he has quit smoking. He has never used smokeless tobacco. He reports current alcohol use. He reports that he does not use drugs.  No Known Allergies  Family History  Problem Relation Age of Onset   Stroke Father    Colon cancer Neg Hx    Colon polyps Neg Hx    Esophageal cancer Neg Hx    Rectal cancer Neg Hx    Stomach cancer Neg Hx      Prior to Admission medications   Medication Sig Start Date End Date Taking? Authorizing Provider  divalproex (DEPAKOTE) 125 MG DR tablet Take 1 tablet (125 mg total) by mouth at bedtime. 08/06/21   Rondel Jumbo, PA-C  donepezil (ARICEPT) 10 MG tablet Take 1 tablet (10 mg total) by mouth at bedtime. 03/27/21   Rondel Jumbo, PA-C  finasteride (PROSCAR) 5 MG tablet Take 5 mg by mouth daily.    [provider]  memantine (NAMENDA) 10 MG tablet Take 1 tablet  twice a day 03/27/21   Rondel Jumbo, PA-C  Nutritional Supplements (VITAMIN D MAINTENANCE PO) Take 1,000 mg by mouth.    [provider]  ondansetron (ZOFRAN-ODT) 8 MG disintegrating tablet Take 1 tablet (8 mg total) by mouth every 8 (eight) hours as needed for nausea or vomiting. 09/28/21   Jaynee Eagles, PA-C  Potassium 99 MG TABS Take by mouth.    [provider]  tadalafil (CIALIS) 5 MG tablet Take 5 mg by mouth daily as needed for erectile dysfunction.    [provider]  tamsulosin (FLOMAX) 0.4 MG CAPS capsule  09/27/15   [provider]    Physical Exam: Vitals:   10/08/21 0200 10/08/21 0215 10/08/21 0230 10/08/21 0342  BP: 112/67  (!) 110/55 (!) 107/57  Pulse: 66 69 65 61  Resp: '20 18 18 18  '$ Temp:    98.6 F (37 C)  TempSrc:    Oral  SpO2: 94% 94% 96% 95%  Weight:      Height:        Constitutional: NAD, calm  Eyes: PERTLA, lids and conjunctivae normal ENMT: Mucous membranes are moist. Posterior pharynx clear of any exudate or lesions.   Neck: supple, no masses  Respiratory: no wheezing, no  crackles. No accessory muscle use.  Cardiovascular: S1 & S2 heard, regular rate and rhythm. No extremity edema.  Abdomen: No distension, tender on left, no rebound pain or guarding. Bowel sounds hypoactive.  Musculoskeletal: no clubbing / cyanosis. No joint deformity upper and lower extremities.   Skin: no significant rashes, lesions, ulcers. Warm, dry, well-perfused. Neurologic: No facial asymmetry. Aphasia. Moving all extremities. Alert and oriented to person and place only.  Psychiatric: Calm. Cooperative.    Labs and Imaging on Admission: I have personally reviewed following labs and imaging studies  CBC: Recent Labs  Lab 10/07/21 2336  WBC 9.7  NEUTROABS 8.2*  HGB 15.9  HCT 45.6  MCV 91.8  PLT 761   Basic Metabolic Panel: Recent Labs  Lab 10/07/21 2336  NA 138  K 3.6  CL 105  CO2 26  GLUCOSE 106*  BUN 20  CREATININE 1.38*  CALCIUM 8.5*   GFR: Estimated Creatinine Clearance: 47.8 mL/min (A) (by C-G formula based on SCr of 1.38 mg/dL (H)). Liver Function Tests: Recent Labs  Lab 10/07/21 2336  AST 21  ALT 23  ALKPHOS 109  BILITOT 0.7  PROT 6.7  ALBUMIN 3.3*   Recent Labs  Lab 10/07/21 2336  LIPASE 31   No results for input(s): AMMONIA in the last 168 hours. Coagulation Profile: Recent Labs  Lab 10/07/21 2336  INR 1.1   Cardiac Enzymes: No results for input(s): CKTOTAL, CKMB, CKMBINDEX, TROPONINI in the last 168 hours. BNP (last 3 results) No results for input(s): PROBNP in the last 8760 hours. HbA1C: No results for input(s): HGBA1C in the last 72 hours. CBG: No results for input(s): GLUCAP in the last 168 hours. Lipid Profile: No results for input(s): CHOL, HDL, LDLCALC, TRIG, CHOLHDL, LDLDIRECT in the last 72 hours. Thyroid Function Tests: No results for input(s): TSH, T4TOTAL, FREET4, T3FREE, THYROIDAB in the last 72 hours. Anemia Panel: No results for input(s): VITAMINB12, FOLATE, FERRITIN, TIBC, IRON, RETICCTPCT in the last 72  hours. Urine analysis: No results found for: COLORURINE, APPEARANCEUR, LABSPEC, PHURINE, GLUCOSEU, HGBUR, BILIRUBINUR, KETONESUR, PROTEINUR, UROBILINOGEN, NITRITE, LEUKOCYTESUR Sepsis Labs: '@LABRCNTIP'$ (procalcitonin:4,lacticidven:4) )No results found for this or any previous visit (from the past 240 hour(s)).   Radiological Exams on Admission: CT Abdomen Pelvis W Contrast  Result Date: 10/08/2021 CLINICAL DATA:  Abdominal pain, nausea/vomiting EXAM: CT ABDOMEN AND PELVIS WITH CONTRAST TECHNIQUE: Multidetector CT imaging of the abdomen and pelvis was performed using the standard protocol following bolus administration of intravenous contrast. RADIATION DOSE REDUCTION: This exam was performed according to the departmental dose-optimization program which includes automated exposure control, adjustment of the mA and/or kV according to patient size and/or use of  iterative reconstruction technique. CONTRAST:  117m OMNIPAQUE IOHEXOL 300 MG/ML  SOLN COMPARISON:  None Available. FINDINGS: Lower chest: Mild bibasilar opacities, likely atelectasis. Hepatobiliary: Liver is within normal limits. Status post cholecystectomy. No intrahepatic or extrahepatic ductal dilatation. Pancreas: Within normal limits. Spleen: Within normal limits. Adrenals/Urinary Tract: Adrenal glands are within normal limits. Subcentimeter right lower pole renal cyst (series 2/image 39). Suspected faint excretory contrast in the bilateral renal collecting systems. No hydronephrosis. Thick-walled bladder, although underdistended. Stomach/Bowel: Stomach is within normal limits. Multiple dilated loops of small bowel in the left mid abdomen. Small bowel stasis in the left mid abdomen (series 2/image 39). Associated gradual transition/narrowing distally in the left mid abdomen (series 2/image 40). Small bowel loops in the right lower quadrant are decompressed (series 2/image 54). Overall, this appearance raises concern for partial small bowel  obstruction, less likely small bowel enteritis. Associated small bowel diverticulum in the left upper abdomen (series 2/image 34), with mild wall thickening, but no convincing inflammation to suggest small bowel diverticulitis. Extensive sigmoid diverticulosis, without evidence of diverticulitis. Vascular/Lymphatic: No evidence of abdominal aortic aneurysm. Atherosclerotic calcifications of the abdominal aorta and branch vessels. No suspicious abdominopelvic lymphadenopathy. Reproductive: Prostate is unremarkable. Other: Small volume pelvic ascites. No pneumatosis or free air. Musculoskeletal: Mild degenerative changes of the lumbar spine. IMPRESSION: Multiple dilated loops of small bowel in the left mid abdomen with associated gradual transition/narrowing distally. Overall, this appearance raises concern for partial small bowel obstruction, less likely small bowel enteritis. Associated small bowel diverticulum in the left anterior abdomen, without convincing small bowel diverticulitis. Small volume pelvic ascites. No pneumatosis or free air. Electronically Signed   By: SJulian HyM.D.   On: 10/08/2021 01:02   DG Chest Port 1 View  Result Date: 10/07/2021 CLINICAL DATA:  Possible sepsis, fever, confusion. EXAM: PORTABLE CHEST 1 VIEW COMPARISON:  None Available. FINDINGS: The heart is borderline enlarged the mediastinal contour is within normal limits. Atherosclerotic calcification of the aorta is noted. Lung volumes are low with mild atelectasis at the left lung base. No consolidation, effusion, or pneumothorax. No acute osseous abnormality. IMPRESSION: Mild atelectasis at the left lung base. Electronically Signed   By: LBrett FairyM.D.   On: 10/07/2021 23:43    EKG: Independently reviewed. Sinus rhythm, LAFB.   Assessment/Plan   1. Partial SBO  - Presents with abdominal pain and nausea and has CT findings suggestive of partial SBO - No sings of ischemia  - Denies surgical hx; no tumor  identified on CT  - No stomach distension or vomiting in ED, will hold off on NGT decompression  - Continue bowel-rest and pain-control, consider surgical consultation and/or Gastrografin challenge    2. CKD IIIa  - SCr is 1.38 on admission, similar to priors  - Renally-dose medications    3. Dementia  - Delirium precautions    DVT prophylaxis: SCDs Code Status: Full  Level of Care:  Level of care: Med-Surg Family Communication: Wife at bedside  Disposition Plan:  Patient is from: home  Anticipated d/c is to: home  Anticipated d/c date is: Possibly as early as 10/09/21  Patient currently: Pending return of bowel function  Consults called: none  Admission status: Observation     TVianne Bulls MD [facility]  10/08/2021, 4:40 AM

## 2021-10-08 NOTE — Plan of Care (Signed)
  Problem: Safety: Goal: Ability to remain free from injury will improve Outcome: Progressing   Problem: Pain Managment: Goal: General experience of comfort will improve Outcome: Progressing   Problem: Elimination: Goal: Will not experience complications related to bowel motility Outcome: Progressing   Problem: Elimination: Goal: Will not experience complications related to urinary retention Outcome: Progressing   Problem: Coping: Goal: Level of anxiety will decrease Outcome: Progressing

## 2021-10-08 NOTE — Progress Notes (Signed)
Subjective: Patient admitted this morning, see detailed H&P by Dr Myna Hidalgo 76 year old male with medical history of dementia presents to ED with complaints of abdominal pain.  He was seen in urgent care on 09/28/2021 for 2 days of abdominal pain, nausea and vomiting.  He was suspected to have viral gastroenteritis at that time and was back to his usual state by 10/01/2021.  He had brief abdominal discomfort few days later which was self-limited but then developed severe pain while eating chicken and rice for dinner nighttime.  In the ED he was found to be afebrile with normal heart rate, normal blood pressure.  Creatinine was 1.38, similar to 2019.  CT showed multiple dilated loops of small bowel with gradual transition distally concerning for partial small bowel obstruction.  Vitals:   10/08/21 0940 10/08/21 1342  BP: 119/68 130/66  Pulse: 62 62  Resp: 17 16  Temp: 98.4 F (36.9 C) 98.5 F (36.9 C)  SpO2: 95% 94%      A/P Partial small bowel obstruction -Patient will be kept n.p.o. -Started on IV LR at 75 mill per hour -No abdominal distention so NG tube decompression was not started -We will continue with bowel rest, pain control -If no improvement by tomorrow will consider general surgery consultation  CKD stage III a -Serum creatinine at baseline  Dementia -Stable, no behavior disturbance    Jason Stephenson [facility]

## 2021-10-08 NOTE — Progress Notes (Signed)
  Transition of Care [facility]) Screening Note   Patient Details  Name: Jason Stephenson Date of Birth: [birth year]   Transition of Care [facility]) CM/SW Contact:    Vassie Moselle, LCSW Phone Number: 10/08/2021, 9:54 AM    Transition of Care Department [facility]) has reviewed patient and no TOC needs have been identified at this time. We will continue to monitor patient advancement through interdisciplinary progression rounds. If new patient transition needs arise, please place a TOC consult.

## 2021-10-08 NOTE — Plan of Care (Signed)
  Problem: Education: Goal: Knowledge of General Education information will improve Description: Including pain rating scale, medication(s)/side effects and non-pharmacologic comfort measures Outcome: Progressing   Problem: Health Behavior/Discharge Planning: Goal: Ability to manage health-related needs will improve Outcome: Progressing   Problem: Clinical Measurements: Goal: Ability to maintain clinical measurements within normal limits will improve Outcome: Progressing   Problem: Elimination: Goal: Will not experience complications related to bowel motility Outcome: Progressing Goal: Will not experience complications related to urinary retention Outcome: Progressing   Problem: Pain Managment: Goal: General experience of comfort will improve Outcome: Progressing   Problem: Safety: Goal: Ability to remain free from injury will improve Outcome: Progressing

## 2021-10-09 ENCOUNTER — Observation Stay (HOSPITAL_COMMUNITY): Payer: Medicare Other

## 2021-10-09 ENCOUNTER — Encounter (HOSPITAL_COMMUNITY): Payer: Self-pay | Admitting: Family Medicine

## 2021-10-09 ENCOUNTER — Inpatient Hospital Stay (HOSPITAL_COMMUNITY): Payer: Medicare Other

## 2021-10-09 DIAGNOSIS — K56600 Partial intestinal obstruction, unspecified as to cause: Secondary | ICD-10-CM | POA: Diagnosis present

## 2021-10-09 DIAGNOSIS — F0280 Dementia in other diseases classified elsewhere without behavioral disturbance: Secondary | ICD-10-CM | POA: Diagnosis not present

## 2021-10-09 DIAGNOSIS — R4701 Aphasia: Secondary | ICD-10-CM | POA: Diagnosis present

## 2021-10-09 DIAGNOSIS — K5700 Diverticulitis of small intestine with perforation and abscess without bleeding: Secondary | ICD-10-CM | POA: Diagnosis present

## 2021-10-09 DIAGNOSIS — K56609 Unspecified intestinal obstruction, unspecified as to partial versus complete obstruction: Secondary | ICD-10-CM

## 2021-10-09 DIAGNOSIS — F05 Delirium due to known physiological condition: Secondary | ICD-10-CM | POA: Diagnosis not present

## 2021-10-09 DIAGNOSIS — Z781 Physical restraint status: Secondary | ICD-10-CM | POA: Diagnosis not present

## 2021-10-09 DIAGNOSIS — K6389 Other specified diseases of intestine: Secondary | ICD-10-CM | POA: Diagnosis not present

## 2021-10-09 DIAGNOSIS — R3129 Other microscopic hematuria: Secondary | ICD-10-CM | POA: Diagnosis not present

## 2021-10-09 DIAGNOSIS — Z87891 Personal history of nicotine dependence: Secondary | ICD-10-CM | POA: Diagnosis not present

## 2021-10-09 DIAGNOSIS — Z86010 Personal history of colonic polyps: Secondary | ICD-10-CM | POA: Diagnosis not present

## 2021-10-09 DIAGNOSIS — Z823 Family history of stroke: Secondary | ICD-10-CM | POA: Diagnosis not present

## 2021-10-09 DIAGNOSIS — F03918 Unspecified dementia, unspecified severity, with other behavioral disturbance: Secondary | ICD-10-CM | POA: Diagnosis present

## 2021-10-09 DIAGNOSIS — G309 Alzheimer's disease, unspecified: Secondary | ICD-10-CM | POA: Diagnosis not present

## 2021-10-09 DIAGNOSIS — R188 Other ascites: Secondary | ICD-10-CM | POA: Diagnosis present

## 2021-10-09 DIAGNOSIS — C179 Malignant neoplasm of small intestine, unspecified: Secondary | ICD-10-CM | POA: Diagnosis not present

## 2021-10-09 DIAGNOSIS — Z79899 Other long term (current) drug therapy: Secondary | ICD-10-CM | POA: Diagnosis not present

## 2021-10-09 DIAGNOSIS — N1831 Chronic kidney disease, stage 3a: Secondary | ICD-10-CM | POA: Diagnosis present

## 2021-10-09 LAB — CBC
HCT: 40.4 % (ref 39.0–52.0)
Hemoglobin: 13.5 g/dL (ref 13.0–17.0)
MCH: 31.5 pg (ref 26.0–34.0)
MCHC: 33.4 g/dL (ref 30.0–36.0)
MCV: 94.2 fL (ref 80.0–100.0)
Platelets: 331 10*3/uL (ref 150–400)
RBC: 4.29 MIL/uL (ref 4.22–5.81)
RDW: 12 % (ref 11.5–15.5)
WBC: 16.1 10*3/uL — ABNORMAL HIGH (ref 4.0–10.5)
nRBC: 0 % (ref 0.0–0.2)

## 2021-10-09 LAB — URINE CULTURE: Culture: NO GROWTH

## 2021-10-09 LAB — BASIC METABOLIC PANEL
Anion gap: 8 (ref 5–15)
BUN: 19 mg/dL (ref 8–23)
CO2: 25 mmol/L (ref 22–32)
Calcium: 8.4 mg/dL — ABNORMAL LOW (ref 8.9–10.3)
Chloride: 103 mmol/L (ref 98–111)
Creatinine, Ser: 1.38 mg/dL — ABNORMAL HIGH (ref 0.61–1.24)
GFR, Estimated: 53 mL/min — ABNORMAL LOW (ref >60)
Glucose, Bld: 117 mg/dL — ABNORMAL HIGH (ref 70–99)
Potassium: 4.1 mmol/L (ref 3.5–5.1)
Sodium: 136 mmol/L (ref 135–145)

## 2021-10-09 LAB — LACTIC ACID, PLASMA: Lactic Acid, Venous: 1.6 mmol/L (ref 0.5–1.9)

## 2021-10-09 MED ORDER — DIATRIZOATE MEGLUMINE & SODIUM 66-10 % PO SOLN
90.0000 mL | Freq: Once | ORAL | Status: AC
  Administered 2021-10-09: 90 mL via NASOGASTRIC
  Filled 2021-10-09: qty 90

## 2021-10-09 MED ORDER — LACTATED RINGERS IV SOLN: INTRAVENOUS | Status: AC

## 2021-10-09 MED ORDER — HYDRALAZINE HCL 20 MG/ML IJ SOLN: 10.0000 mg | INTRAMUSCULAR | Status: DC | PRN

## 2021-10-09 MED ORDER — MENTHOL 3 MG MT LOZG: 1.0000 | LOZENGE | OROMUCOSAL | Status: DC | PRN

## 2021-10-09 MED ORDER — PHENOL 1.4 % MT LIQD
1.0000 | OROMUCOSAL | Status: DC | PRN
  Filled 2021-10-09: qty 177

## 2021-10-09 MED ORDER — ENOXAPARIN SODIUM 40 MG/0.4ML IJ SOSY
40.0000 mg | PREFILLED_SYRINGE | Freq: Every day | INTRAMUSCULAR | Status: DC
  Administered 2021-10-09 – 2021-10-16 (×7): 40 mg via SUBCUTANEOUS
  Filled 2021-10-09 (×7): qty 0.4

## 2021-10-09 NOTE — Progress Notes (Signed)
[facility]  PROGRESS NOTE  Jason Stephenson DGL:[id number] DOB: 1945/06/13 DOA: 10/07/2021 PCP: Ginger Organ., MD   Brief HPI:   76 year old male with medical history of dementia presents to ED with complaints of abdominal pain.  He was seen in urgent care on 09/28/2021 for 2 days of abdominal pain, nausea and vomiting.  He was suspected to have viral gastroenteritis at that time and was back to his usual state by 10/01/2021.  He had brief abdominal discomfort few days later which was self-limited but then developed severe pain while eating chicken and rice for dinner nighttime.   In the ED he was found to be afebrile with normal heart rate, normal blood pressure.  Creatinine was 1.38, similar to 2019.  CT showed multiple dilated loops of small bowel with gradual transition distally concerning for partial small bowel obstruction.    Subjective   Patient seen and examined, still no BM.  KUB obtained this morning still shows small bowel obstruction   Assessment/Plan:     Small bowel obstruction -Patient was kept n.p.o. and started on IV LR at 75 mill per hour -Still no bowel movement -No nausea and vomiting -Does not have abdominal distention so no NG tube decompression was started -KUB still shows SBO -We will consult general surgery  CKD stage III a -Creatinine at baseline  Dementia -Stable -No behavior disturbance  Elevated blood pressure -Patient has no history of hypertension -Not on medications at home -Start IV hydralazine as needed  Medications      Data Reviewed:   CBG:  No results for input(s): GLUCAP in the last 168 hours.  SpO2: 94 %    Vitals:   10/08/21 0940 10/08/21 1342 10/08/21 2011 10/09/21 0455  BP: 119/68 130/66 128/71 (!) 160/78  Pulse: 62 62 76 78  Resp: '17 16 18 18  '$ Temp: 98.4 F (36.9 C) 98.5 F (36.9 C) 98.5 F (36.9 C) 98.5 F (36.9 C)  TempSrc: Oral Oral    SpO2: 95% 94% 94% 94%  Weight:      Height:           Data Reviewed:  Basic Metabolic Panel: Recent Labs  Lab 10/07/21 2336 10/08/21 0535 10/09/21 0323  NA 138 139 136  K 3.6 4.2 4.1  CL 105 104 103  CO2 '26 27 25  '$ GLUCOSE 106* 123* 117*  BUN '20 20 19  '$ CREATININE 1.38* 1.39* 1.38*  CALCIUM 8.5* 8.6* 8.4*  MG  --  2.1  --     CBC: Recent Labs  Lab 10/07/21 2336 10/08/21 0535 10/09/21 0323  WBC 9.7 17.1* 16.1*  NEUTROABS 8.2*  --   --   HGB 15.9 14.4 13.5  HCT 45.6 40.9 40.4  MCV 91.8 91.7 94.2  PLT 400 368 331    LFT Recent Labs  Lab 10/07/21 2336  AST 21  ALT 23  ALKPHOS 109  BILITOT 0.7  PROT 6.7  ALBUMIN 3.3*     Antibiotics: Anti-infectives (From admission, onward)    None        DVT prophylaxis: Lovenox  Code Status: Full code  Family Communication: No family at bedside   CONSULTS    Objective    Physical Examination:   General-appears in no acute distress Heart-S1-S2, regular, no murmur auscultated Lungs-clear to auscultation bilaterally, no wheezing or crackles auscultated Abdomen-soft, nontender, no organomegaly Extremities-no edema in the lower extremities Neuro-alert, oriented x3, no focal deficit noted  Status is: Inpatient: Small bowel obstruction  Oswald Hillock   [facility] If 7PM-7AM, please contact night-coverage at [email or web address], Office  [phone]   10/09/2021, 11:39 AM  LOS: 0 days

## 2021-10-09 NOTE — Consult Note (Signed)
Jason Stephenson 1945-10-16  [id number].    Requesting MD: Dr. Eleonore Chiquito Chief Complaint/Reason for Consult: sbo  HPI:  This is a pleasantly demented white male with a history of dementia who is still very active who's history is provided by his wife and his best friend for his whole life.  Apparently a couple of weeks ago the patient developed some centralized abdominal cramping pain, no n/v/d, and saw an urgent care who told him he had gastroenteritis.  He recovered from that, but 2 days ago in the morning developed another episode of acute onset crampy abdominal pain centrally.  Again no n/v, but did have one episode of some loose stool.  His wife gave him 1 imodium.  Nothing seemed to help his pain and so they presented to the ED for evaluation.  He had a CT scan that revealed multiple dilated loops of small bowel in the left mid abdomen with gradual transition and narrowing.  This raises concern for partial small bowel obstruction.  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  He also has small volume pelvic ascites.  He was admitted and started on clear liquids today, but still with no bowel function and crampy abdominal pain.  Repeat film shows a a dilated loop of small bowel in the LUQ concerning for obstruction.  We have been called to see him for further evaluation and recommendations.  ROS: ROS: Please see HPI  Family History  Problem Relation Age of Onset   Stroke Father    Colon cancer Neg Hx    Colon polyps Neg Hx    Esophageal cancer Neg Hx    Rectal cancer Neg Hx    Stomach cancer Neg Hx     Past Medical History:  Diagnosis Date   Dementia ([place]) 10/08/2021    Past Surgical History:  Procedure Laterality Date   COLONOSCOPY     parathyroid surgery  04/21/2017   POLYPECTOMY      Social History:  reports that he has quit smoking. He has never used smokeless tobacco. He reports current alcohol use. He reports that he does not use  drugs.  Allergies: No Known Allergies  Facility-Administered Medications Prior to Admission  Medication Dose Route Frequency Provider Last Rate Last Admin   0.9 %  sodium chloride infusion  500 mL Intravenous Once Ladene Artist, MD       Medications Prior to Admission  Medication Sig Dispense Refill   Cholecalciferol (VITAMIN D3) 25 MCG (1000 UT) CAPS Take 1,000 Units by mouth daily.     donepezil (ARICEPT) 10 MG tablet Take 1 tablet (10 mg total) by mouth at bedtime. 30 tablet 11   finasteride (PROSCAR) 5 MG tablet Take 5 mg by mouth daily.     memantine (NAMENDA) 10 MG tablet Take 1 tablet  twice a day (Patient taking differently: Take 10 mg by mouth 2 (two) times daily.) 60 tablet 11   tadalafil (CIALIS) 5 MG tablet Take 5 mg by mouth daily.     divalproex (DEPAKOTE) 125 MG DR tablet Take 1 tablet (125 mg total) by mouth at bedtime. (Patient not taking: Reported on 10/08/2021) 30 tablet 3   Nutritional Supplements (VITAMIN D MAINTENANCE PO) Take 1,000 mg by mouth.     ondansetron (ZOFRAN-ODT) 8 MG disintegrating tablet Take 1 tablet (8 mg total) by mouth every 8 (eight) hours as needed for nausea or vomiting. (Patient not taking: Reported on 10/08/2021) 20 tablet  0     Physical Exam: Blood pressure (!) 160/78, pulse 78, temperature 98.5 F (36.9 C), resp. rate 18, height '5\' 10"'$  (1.778 m), weight 78 kg, SpO2 94 %. General: pleasant, WD, WN white male who is laying in bed in NAD HEENT: head is normocephalic, atraumatic.  Sclera are noninjected.  PERRL.  Ears and nose without any masses or lesions.  Mouth is pink and moist Heart: regular, rate, and rhythm.  Normal s1,s2. No obvious murmurs, gallops, or rubs noted.  Palpable radial and pedal pulses bilaterally Lungs: CTAB, no wheezes, rhonchi, or rales noted.  Respiratory effort nonlabored Abd: soft, mild central and LUQ tenderness, abnormality noted in LUQ c/w large dilated loop on CT scan is palpable.  Not really distended, hypoactive  BS, no hernias or organomegaly MS: all 4 extremities are symmetrical with no cyanosis, clubbing, or edema.   Results for orders placed or performed during the hospital encounter of 10/07/21 (from the past 48 hour(s))  Lactic acid, plasma     Status: None   Collection Time: 10/07/21 11:36 PM  Result Value Ref Range   Lactic Acid, Venous 0.9 0.5 - 1.9 mmol/L    Comment: Performed at [facility], [place] [address], [place], [place] [address]  Comprehensive metabolic panel     Status: Abnormal   Collection Time: 10/07/21 11:36 PM  Result Value Ref Range   Sodium 138 135 - 145 mmol/L   Potassium 3.6 3.5 - 5.1 mmol/L   Chloride 105 98 - 111 mmol/L   CO2 26 22 - 32 mmol/L   Glucose, Bld 106 (H) 70 - 99 mg/dL    Comment: Glucose reference range applies only to samples taken after fasting for at least 8 hours.   BUN 20 8 - 23 mg/dL   Creatinine, Ser 1.38 (H) 0.61 - 1.24 mg/dL   Calcium 8.5 (L) 8.9 - 10.3 mg/dL   Total Protein 6.7 6.5 - 8.1 g/dL   Albumin 3.3 (L) 3.5 - 5.0 g/dL   AST 21 15 - 41 U/L   ALT 23 0 - 44 U/L   Alkaline Phosphatase 109 38 - 126 U/L   Total Bilirubin 0.7 0.3 - 1.2 mg/dL   GFR, Estimated 53 (L) >60 mL/min    Comment: (NOTE) Calculated using the CKD-EPI Creatinine Equation (2021)    Anion gap 7 5 - 15    Comment: Performed at [facility], [place] [address] [place], [place] [address]  CBC with Differential     Status: Abnormal   Collection Time: 10/07/21 11:36 PM  Result Value Ref Range   WBC 9.7 4.0 - 10.5 K/uL   RBC 4.97 4.22 - 5.81 MIL/uL   Hemoglobin 15.9 13.0 - 17.0 g/dL   HCT 45.6 39.0 - 52.0 %   MCV 91.8 80.0 - 100.0 fL   MCH 32.0 26.0 - 34.0 pg   MCHC 34.9 30.0 - 36.0 g/dL   RDW 12.0 11.5 - 15.5 %   Platelets 400 150 - 400 K/uL   nRBC 0.0 0.0 - 0.2 %   Neutrophils Relative % 84 %   Neutro Abs 8.2 (H) 1.7 - 7.7 K/uL   Lymphocytes Relative 8 %   Lymphs Abs 0.8 0.7 - 4.0 K/uL   Monocytes Relative 5 %   Monocytes  Absolute 0.5 0.1 - 1.0 K/uL   Eosinophils Relative 2 %   Eosinophils Absolute 0.2 0.0 - 0.5 K/uL   Basophils Relative 0 %   Basophils Absolute 0.0  0.0 - 0.1 K/uL   Immature Granulocytes 1 %   Abs Immature Granulocytes 0.06 0.00 - 0.07 K/uL    Comment: Performed at [facility], [place] [address], [place], [place] [address]  Protime-INR     Status: None   Collection Time: 10/07/21 11:36 PM  Result Value Ref Range   Prothrombin Time 13.8 11.4 - 15.2 seconds   INR 1.1 0.8 - 1.2    Comment: (NOTE) INR goal varies based on device and disease states. Performed at [facility], [place] [address] [place], [place] [address]   APTT     Status: None   Collection Time: 10/07/21 11:36 PM  Result Value Ref Range   aPTT 29 24 - 36 seconds    Comment: Performed at [facility], [place] [address] [place], [place] [address]  Blood Culture (routine x 2)     Status: None (Preliminary result)   Collection Time: 10/07/21 11:36 PM   Specimen: BLOOD  Result Value Ref Range   Specimen Description      BLOOD RIGHT ANTECUBITAL Performed at [place] [address], [place], [place] [address]    Special Requests      BOTTLES DRAWN AEROBIC AND ANAEROBIC Blood Culture results may not be optimal due to an excessive volume of blood received in culture bottles Performed at [place] [address], [place], [place] [address]    Culture      NO GROWTH 1 DAY Performed at [facility] [address], [place], [place] [address]    Report Status PENDING   Lipase, blood     Status: None   Collection Time: 10/07/21 11:36 PM  Result Value Ref Range   Lipase 31 11 - 51 U/L    Comment: Performed at [facility], [place] [address], [place], [place] [address]  Blood Culture (routine x 2)     Status: None (Preliminary result)   Collection Time: 10/07/21 11:41 PM   Specimen: BLOOD  Result Value Ref  Range   Specimen Description      BLOOD BLOOD RIGHT WRIST Performed at [place] [address] [place], [place] [address]    Special Requests      BOTTLES DRAWN AEROBIC AND ANAEROBIC Blood Culture adequate volume Performed at [place] [address], [place], [place] [address]    Culture      NO GROWTH 1 DAY Performed at [facility] [address], [place], [place] [address]    Report Status PENDING   Urinalysis, Routine w reflex microscopic Urine, In & Out Cath     Status: Abnormal   Collection Time: 10/08/21  4:45 AM  Result Value Ref Range   Color, Urine YELLOW YELLOW   APPearance CLEAR CLEAR   Specific Gravity, Urine 1.030 1.005 - 1.030   pH 6.0 5.0 - 8.0   Glucose, UA NEGATIVE NEGATIVE mg/dL   Hgb urine dipstick MODERATE (A) NEGATIVE   Bilirubin Urine NEGATIVE NEGATIVE   Ketones, ur 5 (A) NEGATIVE mg/dL   Protein, ur NEGATIVE NEGATIVE mg/dL   Nitrite NEGATIVE NEGATIVE   Leukocytes,Ua NEGATIVE NEGATIVE   RBC / HPF 21-50 0 - 5 RBC/hpf   WBC, UA 0-5 0 - 5 WBC/hpf   Bacteria, UA NONE SEEN NONE SEEN   Squamous Epithelial / LPF 0-5 0 - 5   Mucus PRESENT    Hyaline Casts, UA PRESENT  Comment: Performed at [facility], [place] [address] [place], [place] [address]  Urine Culture     Status: None   Collection Time: 10/08/21  4:45 AM   Specimen: In/Out Cath Urine  Result Value Ref Range   Specimen Description      IN/OUT CATH URINE Performed at [facility], [place] [address] [place], [place] [address]    Special Requests      NONE Performed at [facility], [place] [address] [place], [place] [address]    Culture      NO GROWTH Performed at [facility], [place] [address] [place], [place] [address]    Report Status 10/09/2021 FINAL   Basic metabolic panel     Status: Abnormal   Collection Time: 10/08/21  5:35 AM  Result Value Ref Range   Sodium 139 135 -  145 mmol/L   Potassium 4.2 3.5 - 5.1 mmol/L   Chloride 104 98 - 111 mmol/L   CO2 27 22 - 32 mmol/L   Glucose, Bld 123 (H) 70 - 99 mg/dL    Comment: Glucose reference range applies only to samples taken after fasting for at least 8 hours.   BUN 20 8 - 23 mg/dL   Creatinine, Ser 1.39 (H) 0.61 - 1.24 mg/dL   Calcium 8.6 (L) 8.9 - 10.3 mg/dL   GFR, Estimated 53 (L) >60 mL/min    Comment: (NOTE) Calculated using the CKD-EPI Creatinine Equation (2021)    Anion gap 8 5 - 15    Comment: Performed at [facility], [place] [address], [place], [place] [address]  Magnesium     Status: None   Collection Time: 10/08/21  5:35 AM  Result Value Ref Range   Magnesium 2.1 1.7 - 2.4 mg/dL    Comment: Performed at [facility], [place] [address] [place], [place] [address]  CBC     Status: Abnormal   Collection Time: 10/08/21  5:35 AM  Result Value Ref Range   WBC 17.1 (H) 4.0 - 10.5 K/uL   RBC 4.46 4.22 - 5.81 MIL/uL   Hemoglobin 14.4 13.0 - 17.0 g/dL   HCT 40.9 39.0 - 52.0 %   MCV 91.7 80.0 - 100.0 fL   MCH 32.3 26.0 - 34.0 pg   MCHC 35.2 30.0 - 36.0 g/dL   RDW 12.0 11.5 - 15.5 %   Platelets 368 150 - 400 K/uL   nRBC 0.0 0.0 - 0.2 %    Comment: Performed at [facility], [place] [address], [place], [place] [address]  Basic metabolic panel     Status: Abnormal   Collection Time: 10/09/21  3:23 AM  Result Value Ref Range   Sodium 136 135 - 145 mmol/L   Potassium 4.1 3.5 - 5.1 mmol/L   Chloride 103 98 - 111 mmol/L   CO2 25 22 - 32 mmol/L   Glucose, Bld 117 (H) 70 - 99 mg/dL    Comment: Glucose reference range applies only to samples taken after fasting for at least 8 hours.   BUN 19 8 - 23 mg/dL   Creatinine, Ser 1.38 (H) 0.61 - 1.24 mg/dL   Calcium 8.4 (L) 8.9 - 10.3 mg/dL   GFR, Estimated 53 (L) >60 mL/min    Comment: (NOTE) Calculated using the CKD-EPI Creatinine Equation (2021)    Anion gap 8 5 - 15    Comment: Performed at [facility], [place] [address]  [address] [place], [place] [address]  CBC     Status: Abnormal   Collection Time: 10/09/21  3:23 AM  Result Value Ref Range   WBC 16.1 (H) 4.0 - 10.5 K/uL   RBC 4.29 4.22 - 5.81 MIL/uL   Hemoglobin 13.5 13.0 - 17.0 g/dL   HCT 40.4 39.0 - 52.0 %   MCV 94.2 80.0 - 100.0 fL   MCH 31.5 26.0 - 34.0 pg   MCHC 33.4 30.0 - 36.0 g/dL   RDW 12.0 11.5 - 15.5 %   Platelets 331 150 - 400 K/uL   nRBC 0.0 0.0 - 0.2 %    Comment: Performed at [facility], [place] [address], [place], [place] [address]   DG Abd 1 View  Result Date: 10/09/2021 CLINICAL DATA:  Small bowel obstruction EXAM: ABDOMEN - 1 VIEW COMPARISON:  CT abdomen pelvis, 10/08/2021 FINDINGS: Gas distended loops of small bowel in the central abdomen measuring up to 5.6 cm in caliber. Little if any colonic gas appreciated. No large burden of stool. No obvious free air on supine radiographs. No radio-opaque calculi or other significant radiographic abnormality are seen. IMPRESSION: Gas distended loops of small bowel in the central abdomen measuring up to 5.6 cm in caliber. Little if any colonic gas appreciated. Findings are consistent with small bowel obstruction. Electronically Signed   By: Delanna Ahmadi M.D.   On: 10/09/2021 10:31   CT Abdomen Pelvis W Contrast  Result Date: 10/08/2021 CLINICAL DATA:  Abdominal pain, nausea/vomiting EXAM: CT ABDOMEN AND PELVIS WITH CONTRAST TECHNIQUE: Multidetector CT imaging of the abdomen and pelvis was performed using the standard protocol following bolus administration of intravenous contrast. RADIATION DOSE REDUCTION: This exam was performed according to the departmental dose-optimization program which includes automated exposure control, adjustment of the mA and/or kV according to patient size and/or use of iterative reconstruction technique. CONTRAST:  159m OMNIPAQUE IOHEXOL 300 MG/ML  SOLN COMPARISON:  None Available. FINDINGS: Lower chest: Mild bibasilar  opacities, likely atelectasis. Hepatobiliary: Liver is within normal limits. Status post cholecystectomy. No intrahepatic or extrahepatic ductal dilatation. Pancreas: Within normal limits. Spleen: Within normal limits. Adrenals/Urinary Tract: Adrenal glands are within normal limits. Subcentimeter right lower pole renal cyst (series 2/image 39). Suspected faint excretory contrast in the bilateral renal collecting systems. No hydronephrosis. Thick-walled bladder, although underdistended. Stomach/Bowel: Stomach is within normal limits. Multiple dilated loops of small bowel in the left mid abdomen. Small bowel stasis in the left mid abdomen (series 2/image 39). Associated gradual transition/narrowing distally in the left mid abdomen (series 2/image 40). Small bowel loops in the right lower quadrant are decompressed (series 2/image 54). Overall, this appearance raises concern for partial small bowel obstruction, less likely small bowel enteritis. Associated small bowel diverticulum in the left upper abdomen (series 2/image 34), with mild wall thickening, but no convincing inflammation to suggest small bowel diverticulitis. Extensive sigmoid diverticulosis, without evidence of diverticulitis. Vascular/Lymphatic: No evidence of abdominal aortic aneurysm. Atherosclerotic calcifications of the abdominal aorta and branch vessels. No suspicious abdominopelvic lymphadenopathy. Reproductive: Prostate is unremarkable. Other: Small volume pelvic ascites. No pneumatosis or free air. Musculoskeletal: Mild degenerative changes of the lumbar spine. IMPRESSION: Multiple dilated loops of small bowel in the left mid abdomen with associated gradual transition/narrowing distally. Overall, this appearance raises concern for partial small bowel obstruction, less likely small bowel enteritis. Associated small bowel diverticulum in the left anterior abdomen, without convincing small bowel diverticulitis. Small volume pelvic ascites. No  pneumatosis or free air. Electronically Signed  By: Julian Hy M.D.   On: 10/08/2021 01:02   DG Chest Port 1 View  Result Date: 10/07/2021 CLINICAL DATA:  Possible sepsis, fever, confusion. EXAM: PORTABLE CHEST 1 VIEW COMPARISON:  None Available. FINDINGS: The heart is borderline enlarged the mediastinal contour is within normal limits. Atherosclerotic calcification of the aorta is noted. Lung volumes are low with mild atelectasis at the left lung base. No consolidation, effusion, or pneumothorax. No acute osseous abnormality. IMPRESSION: Mild atelectasis at the left lung base. Electronically Signed   By: Brett Fairy M.D.   On: 10/07/2021 23:43      Assessment/Plan SBO The patient has been seen, examined, imaging independently reviewed and interpreted, labs, vitals, and chart reviewed as well.  The patient clinically appears well; however, his CT scan is somewhat concerning to me.  His small bowel does not appear normal and in the setting of free fluid in the pelvis, virgin abdomen, and leukocytosis, i'm concerned there is more going on here than how he clinically looks.  Given how large his SB is on plain film, he needs an NGT for decompression.  We will start with the SBO protocol to give [place] some information.  We need to closely monitor his WBC as well.  I am going to check a lactic acid level as well.  I'm concerned the patient may need an operation, but we will try a short course of conservative management first.  This was thoroughly discussed with the patient (although he doesn't fully get it due to his dementia), wife, and best friend at bedside.  They agree with this plan. He does NOT have an acute abdomen at this time or with unstable vitals sign so a short course of conservative management is appropriate.  ADDENDUM: NGT has been placed and already has about 500cc of thick green/feculent appearing NGT output.  We will try the protocol today, but if not significant improvement, we will  likely proceed with at least a diagnostic laparoscopy tomorrow.      FEN - NPO/NGT/IVFs VTE - lovenox ID - none currently needed  AKI - cr 1.38, but in review of a lab from 2019 his Cr was 1.33 at that time, so this is likely his baseline Leukocytosis - 16 from 17.  Check in am.  Lactic acid has gone from 0.9 to now 1.6.  still normal but trending up despite hydration.  Will check again in am Dementia   I reviewed hospitalist notes, last 24 h vitals and pain scores, last 48 h intake and output, last 24 h labs and trends, and last 24 h imaging results.  Henreitta Cea, [facility] 10/09/2021, 1:21 PM Please see Amion for pager number during day hours 7:00am-4:30pm or 7:00am -11:30am on weekends

## 2021-10-10 ENCOUNTER — Inpatient Hospital Stay (HOSPITAL_COMMUNITY): Payer: Medicare Other | Admitting: Anesthesiology

## 2021-10-10 ENCOUNTER — Other Ambulatory Visit: Payer: Self-pay

## 2021-10-10 ENCOUNTER — Encounter (HOSPITAL_COMMUNITY): Payer: Self-pay | Admitting: Family Medicine

## 2021-10-10 ENCOUNTER — Encounter (HOSPITAL_COMMUNITY)
Admission: EM | Disposition: A | Discharge status: Home / Self Care | Payer: Self-pay | Source: Home / Self Care | Attending: Family Medicine

## 2021-10-10 DIAGNOSIS — N1831 Chronic kidney disease, stage 3a: Secondary | ICD-10-CM | POA: Diagnosis not present

## 2021-10-10 DIAGNOSIS — K5700 Diverticulitis of small intestine with perforation and abscess without bleeding: Secondary | ICD-10-CM

## 2021-10-10 DIAGNOSIS — C179 Malignant neoplasm of small intestine, unspecified: Secondary | ICD-10-CM

## 2021-10-10 DIAGNOSIS — K56600 Partial intestinal obstruction, unspecified as to cause: Secondary | ICD-10-CM | POA: Diagnosis not present

## 2021-10-10 DIAGNOSIS — K56609 Unspecified intestinal obstruction, unspecified as to partial versus complete obstruction: Secondary | ICD-10-CM

## 2021-10-10 HISTORY — PX: LAPAROSCOPY: SHX197

## 2021-10-10 HISTORY — PX: LAPAROSCOPIC SMALL BOWEL RESECTION: SHX5929

## 2021-10-10 LAB — BASIC METABOLIC PANEL
Anion gap: 9 (ref 5–15)
BUN: 20 mg/dL (ref 8–23)
CO2: 25 mmol/L (ref 22–32)
Calcium: 8.3 mg/dL — ABNORMAL LOW (ref 8.9–10.3)
Chloride: 103 mmol/L (ref 98–111)
Creatinine, Ser: 1.28 mg/dL — ABNORMAL HIGH (ref 0.61–1.24)
GFR, Estimated: 58 mL/min — ABNORMAL LOW (ref >60)
Glucose, Bld: 101 mg/dL — ABNORMAL HIGH (ref 70–99)
Potassium: 4 mmol/L (ref 3.5–5.1)
Sodium: 137 mmol/L (ref 135–145)

## 2021-10-10 LAB — CBC
HCT: 40.2 % (ref 39.0–52.0)
Hemoglobin: 14.1 g/dL (ref 13.0–17.0)
MCH: 32.6 pg (ref 26.0–34.0)
MCHC: 35.1 g/dL (ref 30.0–36.0)
MCV: 92.8 fL (ref 80.0–100.0)
Platelets: 344 10*3/uL (ref 150–400)
RBC: 4.33 MIL/uL (ref 4.22–5.81)
RDW: 11.9 % (ref 11.5–15.5)
WBC: 12.4 10*3/uL — ABNORMAL HIGH (ref 4.0–10.5)
nRBC: 0 % (ref 0.0–0.2)

## 2021-10-10 LAB — TYPE AND SCREEN
ABO/RH(D): O POS
Antibody Screen: NEGATIVE

## 2021-10-10 LAB — ABO/RH: ABO/RH(D): O POS

## 2021-10-10 SURGERY — EXCISION, SMALL INTESTINE, LAPAROSCOPIC
Anesthesia: General | Site: Abdomen

## 2021-10-10 MED ORDER — FENTANYL CITRATE (PF) 100 MCG/2ML IJ SOLN
INTRAMUSCULAR | Status: AC
  Filled 2021-10-10: qty 2

## 2021-10-10 MED ORDER — BUPIVACAINE LIPOSOME 1.3 % IJ SUSP
INTRAMUSCULAR | Status: DC | PRN
  Administered 2021-10-10: 20 mL

## 2021-10-10 MED ORDER — ROCURONIUM BROMIDE 10 MG/ML (PF) SYRINGE
PREFILLED_SYRINGE | INTRAVENOUS | Status: DC | PRN
  Administered 2021-10-10: 50 mg via INTRAVENOUS
  Administered 2021-10-10: 10 mg via INTRAVENOUS

## 2021-10-10 MED ORDER — PROPOFOL 10 MG/ML IV BOLUS
INTRAVENOUS | Status: AC
  Filled 2021-10-10: qty 20

## 2021-10-10 MED ORDER — LACTATED RINGERS IR SOLN
Status: DC | PRN
  Administered 2021-10-10: 1000 mL

## 2021-10-10 MED ORDER — ONDANSETRON HCL 4 MG/2ML IJ SOLN
INTRAMUSCULAR | Status: DC | PRN
  Administered 2021-10-10: 4 mg via INTRAVENOUS

## 2021-10-10 MED ORDER — DIPHENHYDRAMINE HCL 50 MG/ML IJ SOLN
12.5000 mg | Freq: Four times a day (QID) | INTRAMUSCULAR | Status: DC | PRN
  Administered 2021-10-11 – 2021-10-12 (×2): 25 mg via INTRAVENOUS
  Filled 2021-10-10 (×2): qty 1

## 2021-10-10 MED ORDER — ORAL CARE MOUTH RINSE: 15.0000 mL | Freq: Once | OROMUCOSAL | Status: DC

## 2021-10-10 MED ORDER — SUGAMMADEX SODIUM 200 MG/2ML IV SOLN
INTRAVENOUS | Status: DC | PRN
  Administered 2021-10-10: 200 mg via INTRAVENOUS

## 2021-10-10 MED ORDER — SODIUM CHLORIDE 0.9 % IV SOLN
2.0000 g | INTRAVENOUS | Status: AC
  Administered 2021-10-10: 2 g via INTRAVENOUS
  Filled 2021-10-10: qty 2

## 2021-10-10 MED ORDER — HYDROMORPHONE HCL 1 MG/ML IJ SOLN: 0.2500 mg | INTRAMUSCULAR | Status: DC | PRN

## 2021-10-10 MED ORDER — SUCCINYLCHOLINE CHLORIDE 200 MG/10ML IV SOSY
PREFILLED_SYRINGE | INTRAVENOUS | Status: AC
  Filled 2021-10-10: qty 10

## 2021-10-10 MED ORDER — CHLORHEXIDINE GLUCONATE CLOTH 2 % EX PADS
6.0000 | MEDICATED_PAD | Freq: Once | CUTANEOUS | Status: AC
  Administered 2021-10-10: 6 via TOPICAL

## 2021-10-10 MED ORDER — PHENYLEPHRINE HCL (PRESSORS) 10 MG/ML IV SOLN
INTRAVENOUS | Status: AC
  Filled 2021-10-10: qty 1

## 2021-10-10 MED ORDER — BUPIVACAINE-EPINEPHRINE 0.25% -1:200000 IJ SOLN
INTRAMUSCULAR | Status: DC | PRN
Start: 2021-10-10 — End: 2021-10-10
  Administered 2021-10-10: 60 mL

## 2021-10-10 MED ORDER — FENTANYL CITRATE (PF) 100 MCG/2ML IJ SOLN
INTRAMUSCULAR | Status: DC | PRN
  Administered 2021-10-10: 100 ug via INTRAVENOUS
  Administered 2021-10-10 (×3): 50 ug via INTRAVENOUS

## 2021-10-10 MED ORDER — LIDOCAINE 2% (20 MG/ML) 5 ML SYRINGE
INTRAMUSCULAR | Status: DC | PRN
  Administered 2021-10-10: 80 mg via INTRAVENOUS

## 2021-10-10 MED ORDER — DEXTROSE 5 % IV SOLN
1000.0000 mg | Freq: Four times a day (QID) | INTRAVENOUS | Status: DC | PRN
Start: 2021-10-10 — End: 2021-10-13
  Filled 2021-10-10: qty 10

## 2021-10-10 MED ORDER — LACTATED RINGERS IV SOLN: INTRAVENOUS | Status: DC

## 2021-10-10 MED ORDER — BUPIVACAINE LIPOSOME 1.3 % IJ SUSP: 20.0000 mL | Freq: Once | INTRAMUSCULAR | Status: DC

## 2021-10-10 MED ORDER — DEXAMETHASONE SODIUM PHOSPHATE 10 MG/ML IJ SOLN
INTRAMUSCULAR | Status: DC | PRN
  Administered 2021-10-10: 10 mg via INTRAVENOUS

## 2021-10-10 MED ORDER — LACTATED RINGERS IV BOLUS: 1000.0000 mL | Freq: Three times a day (TID) | INTRAVENOUS | Status: AC | PRN

## 2021-10-10 MED ORDER — 0.9 % SODIUM CHLORIDE (POUR BTL) OPTIME
TOPICAL | Status: DC | PRN
  Administered 2021-10-10: 1000 mL

## 2021-10-10 MED ORDER — CHLORHEXIDINE GLUCONATE 0.12 % MT SOLN: 15.0000 mL | Freq: Once | OROMUCOSAL | Status: DC

## 2021-10-10 MED ORDER — LIDOCAINE HCL (PF) 2 % IJ SOLN
INTRAMUSCULAR | Status: AC
  Filled 2021-10-10: qty 5

## 2021-10-10 MED ORDER — AMISULPRIDE (ANTIEMETIC) 5 MG/2ML IV SOLN: 10.0000 mg | Freq: Once | INTRAVENOUS | Status: DC | PRN

## 2021-10-10 MED ORDER — BUPIVACAINE-EPINEPHRINE (PF) 0.25% -1:200000 IJ SOLN
INTRAMUSCULAR | Status: AC
  Filled 2021-10-10: qty 60

## 2021-10-10 MED ORDER — ENALAPRILAT 1.25 MG/ML IV SOLN
0.6250 mg | Freq: Four times a day (QID) | INTRAVENOUS | Status: DC | PRN
  Filled 2021-10-10: qty 1

## 2021-10-10 MED ORDER — BUPIVACAINE LIPOSOME 1.3 % IJ SUSP
INTRAMUSCULAR | Status: AC
  Filled 2021-10-10: qty 20

## 2021-10-10 MED ORDER — BUPIVACAINE-EPINEPHRINE 0.25% -1:200000 IJ SOLN
INTRAMUSCULAR | Status: AC
  Filled 2021-10-10: qty 1

## 2021-10-10 MED ORDER — ONDANSETRON HCL 4 MG/2ML IJ SOLN
INTRAMUSCULAR | Status: AC
  Filled 2021-10-10: qty 2

## 2021-10-10 MED ORDER — PROPOFOL 10 MG/ML IV BOLUS
INTRAVENOUS | Status: DC | PRN
Start: 2021-10-10 — End: 2021-10-10
  Administered 2021-10-10: 100 mg via INTRAVENOUS
  Administered 2021-10-10: 50 mg via INTRAVENOUS

## 2021-10-10 MED ORDER — SUCCINYLCHOLINE CHLORIDE 200 MG/10ML IV SOSY
PREFILLED_SYRINGE | INTRAVENOUS | Status: DC | PRN
  Administered 2021-10-10: 100 mg via INTRAVENOUS

## 2021-10-10 MED ORDER — SODIUM CHLORIDE 0.9 % IV SOLN
INTRAVENOUS | Status: AC
  Filled 2021-10-10: qty 2

## 2021-10-10 MED ORDER — DEXAMETHASONE SODIUM PHOSPHATE 10 MG/ML IJ SOLN
INTRAMUSCULAR | Status: AC
  Filled 2021-10-10: qty 1

## 2021-10-10 SURGICAL SUPPLY — 72 items
APPLIER CLIP 5 13 M/L LIGAMAX5 (MISCELLANEOUS)
APPLIER CLIP ROT 10 11.4 M/L (STAPLE)
BLADE HEX COATED 2.75 (ELECTRODE) ×3 IMPLANT
BLADE SURG SZ10 CARB STEEL (BLADE) ×3 IMPLANT
CABLE HIGH FREQUENCY MONO STRZ (ELECTRODE) ×3 IMPLANT
CELLS DAT CNTRL 66122 CELL SVR (MISCELLANEOUS) ×2 IMPLANT
CLIP APPLIE 5 13 M/L LIGAMAX5 (MISCELLANEOUS) IMPLANT
CLIP APPLIE ROT 10 11.4 M/L (STAPLE) IMPLANT
COVER MAYO STAND STRL (DRAPES) ×3 IMPLANT
DRAIN CHANNEL 19F RND (DRAIN) IMPLANT
DRAPE LAPAROSCOPIC ABDOMINAL (DRAPES) ×3 IMPLANT
DRAPE SHEET LG 3/4 BI-LAMINATE (DRAPES) IMPLANT
DRAPE UTILITY XL STRL (DRAPES) ×3 IMPLANT
DRAPE WARM FLUID 44X44 (DRAPES) ×3 IMPLANT
DRSG OPSITE POSTOP 4X10 (GAUZE/BANDAGES/DRESSINGS) IMPLANT
DRSG OPSITE POSTOP 4X6 (GAUZE/BANDAGES/DRESSINGS) ×1 IMPLANT
DRSG OPSITE POSTOP 4X8 (GAUZE/BANDAGES/DRESSINGS) IMPLANT
DRSG TEGADERM 2-3/8X2-3/4 SM (GAUZE/BANDAGES/DRESSINGS) ×7 IMPLANT
DRSG TEGADERM 4X4.75 (GAUZE/BANDAGES/DRESSINGS) ×2 IMPLANT
ELECT REM PT RETURN 15FT ADLT (MISCELLANEOUS) ×3 IMPLANT
ENDOLOOP SUT PDS II  0 18 (SUTURE)
ENDOLOOP SUT PDS II 0 18 (SUTURE) IMPLANT
GAUZE SPONGE 2X2 8PLY STRL LF (GAUZE/BANDAGES/DRESSINGS) ×2 IMPLANT
GAUZE SPONGE 4X4 12PLY STRL (GAUZE/BANDAGES/DRESSINGS) IMPLANT
GLOVE ECLIPSE 8.0 STRL XLNG CF (GLOVE) ×3 IMPLANT
GLOVE INDICATOR 8.0 STRL GRN (GLOVE) ×3 IMPLANT
GOWN STRL REUS W/ TWL XL LVL3 (GOWN DISPOSABLE) ×10 IMPLANT
GOWN STRL REUS W/TWL XL LVL3 (GOWN DISPOSABLE) ×15
HANDLE SUCTION POOLE (INSTRUMENTS) ×2 IMPLANT
IRRIG SUCT STRYKERFLOW 2 WTIP (MISCELLANEOUS) ×3
IRRIGATION SUCT STRKRFLW 2 WTP (MISCELLANEOUS) ×2 IMPLANT
KIT BASIN OR (CUSTOM PROCEDURE TRAY) ×3 IMPLANT
KIT TURNOVER KIT A (KITS) IMPLANT
PAD POSITIONING PINK XL (MISCELLANEOUS) ×3 IMPLANT
PENCIL SMOKE EVACUATOR (MISCELLANEOUS) ×1 IMPLANT
PROTECTOR NERVE ULNAR (MISCELLANEOUS) ×3 IMPLANT
RETRACTOR WND ALEXIS 18 MED (MISCELLANEOUS) IMPLANT
RTRCTR WOUND ALEXIS 18CM MED (MISCELLANEOUS) ×3
SCISSORS LAP 5X35 DISP (ENDOMECHANICALS) ×3 IMPLANT
SEALER TISSUE G2 STRG ARTC 35C (ENDOMECHANICALS) ×1 IMPLANT
SET TUBE SMOKE EVAC HIGH FLOW (TUBING) ×3 IMPLANT
SLEEVE Z-THREAD 5X100MM (TROCAR) ×6 IMPLANT
SPIKE FLUID TRANSFER (MISCELLANEOUS) ×2 IMPLANT
SPONGE GAUZE 2X2 STER 10/PKG (GAUZE/BANDAGES/DRESSINGS) ×1
STAPLER 90 3.5 STAND SLIM (STAPLE) ×3
STAPLER 90 3.5 STD SLIM (STAPLE) IMPLANT
STAPLER PROXIMATE 75MM BLUE (STAPLE) ×1 IMPLANT
STAPLER VISISTAT 35W (STAPLE) IMPLANT
SUCTION POOLE HANDLE (INSTRUMENTS) ×3
SURGILUBE 2OZ TUBE FLIPTOP (MISCELLANEOUS) IMPLANT
SUT MNCRL AB 4-0 PS2 18 (SUTURE) ×4 IMPLANT
SUT PDS AB 1 CT1 27 (SUTURE) ×2 IMPLANT
SUT PDS AB 1 TP1 96 (SUTURE) IMPLANT
SUT PROLENE 0 CT 2 (SUTURE) IMPLANT
SUT SILK 2 0 (SUTURE) ×3
SUT SILK 2 0 SH CR/8 (SUTURE) ×5 IMPLANT
SUT SILK 2-0 18XBRD TIE 12 (SUTURE) ×2 IMPLANT
SUT SILK 3 0 (SUTURE)
SUT SILK 3 0 SH CR/8 (SUTURE) ×2 IMPLANT
SUT SILK 3-0 18XBRD TIE 12 (SUTURE) ×2 IMPLANT
SYR BULB IRRIG 60ML STRL (SYRINGE) ×3 IMPLANT
SYS LAPSCP GELPORT 120MM (MISCELLANEOUS)
SYS WOUND ALEXIS 18CM MED (MISCELLANEOUS) ×3
SYSTEM LAPSCP GELPORT 120MM (MISCELLANEOUS) IMPLANT
SYSTEM WOUND ALEXIS 18CM MED (MISCELLANEOUS) IMPLANT
TAPE UMBILICAL 1/8 X36 TWILL (MISCELLANEOUS) IMPLANT
TOWEL OR 17X26 10 PK STRL BLUE (TOWEL DISPOSABLE) ×6 IMPLANT
TOWEL OR NON WOVEN STRL DISP B (DISPOSABLE) ×3 IMPLANT
TRAY FOLEY MTR SLVR 16FR STAT (SET/KITS/TRAYS/PACK) ×1 IMPLANT
TRAY LAPAROSCOPIC (CUSTOM PROCEDURE TRAY) ×3 IMPLANT
TROCAR XCEL NON-BLD 11X100MML (ENDOMECHANICALS) IMPLANT
TROCAR Z-THREAD OPTICAL 5X100M (TROCAR) ×3 IMPLANT

## 2021-10-10 NOTE — Transfer of Care (Signed)
Immediate Anesthesia Transfer of Care Note  Patient: Jason Stephenson  Procedure(s) Performed: LAPAROSCOPIC SMALL BOWEL RESECTION LAPAROSCOPY DIAGNOSTIC (Abdomen)  Patient Location: PACU  Anesthesia Type:General  Level of Consciousness: drowsy  Airway & Oxygen Therapy: Patient Spontanous Breathing and Patient connected to face mask oxygen  Post-op Assessment: Report given to RN and Post -op Vital signs reviewed and stable  Post vital signs: Reviewed and stable  Last Vitals:  Vitals Value Taken Time  BP 161/72 10/10/21 1333  Temp    Pulse 70 10/10/21 1335  Resp 14 10/10/21 1335  SpO2 100 % 10/10/21 1335  Vitals shown include unvalidated device data.  Last Pain:  Vitals:   10/10/21 0845  TempSrc:   PainSc: 3       Patients Stated Pain Goal: 3 (31/49/70 2637)  Complications: No notable events documented.

## 2021-10-10 NOTE — Plan of Care (Signed)
  Problem: Clinical Measurements: Goal: Ability to maintain clinical measurements within normal limits will improve Outcome: Progressing   Problem: Coping: Goal: Level of anxiety will decrease Outcome: Progressing   Problem: Pain Managment: Goal: General experience of comfort will improve Outcome: Progressing   Problem: Safety: Goal: Ability to remain free from injury will improve Outcome: Progressing   Problem: Skin Integrity: Goal: Risk for impaired skin integrity will decrease Outcome: Progressing

## 2021-10-10 NOTE — Anesthesia Postprocedure Evaluation (Signed)
Anesthesia Post Note  Patient: Jason Stephenson, Jason Stephenson  Procedure(s) Performed: LAPAROSCOPIC SMALL BOWEL RESECTION LAPAROSCOPY DIAGNOSTIC (Abdomen)     Patient location during evaluation: PACU Anesthesia Type: General Level of consciousness: sedated and patient cooperative Pain management: pain level controlled Vital Signs Assessment: post-procedure vital signs reviewed and stable Respiratory status: spontaneous breathing Cardiovascular status: stable Anesthetic complications: no   No notable events documented.  Last Vitals:  Vitals:   10/10/21 1415 10/10/21 1430  BP: (!) 157/82 (!) 156/78  Pulse: 76 63  Resp: 14 14  Temp:    SpO2: 100% 100%    Last Pain:  Vitals:   10/10/21 1415  TempSrc:   PainSc: 0-No pain                 Nolon Nations

## 2021-10-10 NOTE — Anesthesia Preprocedure Evaluation (Signed)
Anesthesia Evaluation  Patient identified by MRN, date of birth, ID band Patient awake    Reviewed: Allergy & Precautions, NPO status , Patient's Chart, lab work & pertinent test results  Airway Mallampati: II  TM Distance: >3 FB Neck ROM: Full    Dental no notable dental hx. (+) Teeth Intact, Dental Advisory Given   Pulmonary neg pulmonary ROS, former smoker,    Pulmonary exam normal breath sounds clear to auscultation       Cardiovascular negative cardio ROS Normal cardiovascular exam Rhythm:Regular Rate:Normal     Neuro/Psych PSYCHIATRIC DISORDERS Dementia Expressive aphasia, reportedly neurology feels this is due to dementianegative neurological ROS     GI/Hepatic negative GI ROS, Neg liver ROS,   Endo/Other  negative endocrine ROS  Renal/GU Renal disease     Musculoskeletal  (+) Arthritis ,   Abdominal   Peds  Hematology negative hematology ROS (+)   Anesthesia Other Findings   Reproductive/Obstetrics                             Anesthesia Physical Anesthesia Plan  ASA: 2 and emergent  Anesthesia Plan: General   Post-op Pain Management: Ofirmev IV (intra-op)*   Induction: Intravenous, Rapid sequence and Cricoid pressure planned  PONV Risk Score and Plan: 4 or greater and Ondansetron, Dexamethasone, Treatment may vary due to age or medical condition and Propofol infusion  Airway Management Planned: Oral ETT  Additional Equipment:   Intra-op Plan:   Post-operative Plan: Extubation in OR  Informed Consent: I have reviewed the patients History and Physical, chart, labs and discussed the procedure including the risks, benefits and alternatives for the proposed anesthesia with the patient or authorized representative who has indicated his/her understanding and acceptance.     Dental advisory given  Plan Discussed with: CRNA  Anesthesia Plan Comments:          Anesthesia Quick Evaluation

## 2021-10-10 NOTE — Plan of Care (Signed)

## 2021-10-10 NOTE — Anesthesia Procedure Notes (Signed)
Procedure Name: Intubation Date/Time: 10/10/2021 11:43 AM Performed by: Sharlette Dense, CRNA Pre-anesthesia Checklist: Patient identified, Emergency Drugs available, Suction available and Patient being monitored Patient Re-evaluated:Patient Re-evaluated prior to induction Oxygen Delivery Method: Circle system utilized Preoxygenation: Pre-oxygenation with 100% oxygen Induction Type: IV induction, Rapid sequence and Cricoid Pressure applied Laryngoscope Size: Miller and 3 Grade View: Grade I Tube type: Oral Tube size: 8.0 mm Number of attempts: 1 Airway Equipment and Method: Stylet Placement Confirmation: ETT inserted through vocal cords under direct vision, positive ETCO2 and breath sounds checked- equal and bilateral Secured at: 22 cm Tube secured with: Tape Dental Injury: Teeth and Oropharynx as per pre-operative assessment

## 2021-10-10 NOTE — Progress Notes (Signed)
Pt removed the NG tube, notified J. Olena Heckle, NP. Had orders to insert back the NG tube, inserted a new NG tube to R nare, abd xray confirmed placement. Pt reconnected to low wall intermittent suctioning. Will continue to monitor pt.

## 2021-10-10 NOTE — Progress Notes (Signed)
Pt was alert, shifted to Short stay, Report given to short stay RN.

## 2021-10-10 NOTE — Progress Notes (Signed)
Pt confused and unable to ambulate as per postop orders. Will cont to monitor.

## 2021-10-10 NOTE — Op Note (Signed)
PATIENT:  Jason Stephenson  76 y.o. male  Patient Care Team: Ginger Organ., MD as PCP - General (Internal Medicine) Delice Lesch Lezlie Octave, MD as Consulting Physician (Neurology)  PRE-OPERATIVE DIAGNOSIS:  SMALL BOWEL OBSTRUCTION  POST-OPERATIVE DIAGNOSIS:   SMALL BOWEL OBSTRUCTION DUE TO JEJUNAL MASS PERFORATED JEJUNAL DIVERTICULITIS   PROCEDURE:   LAPAROSCOPIC SMALL BOWEL RESECTION LAPAROSCOPY DIAGNOSTIC WITH LYSIS OF ADHESIONS  SURGEON:  Adin Hector, MD  ASSIST:  Judyann Munson, RNFA    ANESTHESIA:   local and general  EBL:  Total I/O In: -  Out: 325 [Urine:75; Emesis/NG output:200; Blood:50]  Delay start of Pharmacological VTE agent (>24hrs) due to surgical blood loss or risk of bleeding:  no  DRAINS: none   SPECIMEN:  JEJUNUM  (containing intraluminal obstructing mass as well as jejunal phlegmon suspicious for perforated jejunal diverticulitis.)  DISPOSITION OF SPECIMEN:  PATHOLOGY  COUNTS:  YES  PLAN OF CARE: Admit to inpatient   PATIENT DISPOSITION:  PACU - hemodynamically stable.   INDICATIONS: Patient with concerning symptoms & work up suspicious for small bowel obstruction.  Atypical findings and no prior surgery.  Persistent pain and significant complete obstruction.  Surgery was recommended:  The anatomy & physiology of the digestive tract was discussed.  The pathophysiology of perforation was discussed.  Differential diagnosis such as perforated ulcer or colon, etc was discussed.   Natural history risks without surgery such as death was discussed.  I recommended abdominal exploration to diagnose & treat the source of the problem.  Laparoscopic & open techniques were discussed.   Risks such as bleeding, infection, abscess, leak, reoperation, bowel resection, possible ostomy, injury to other organs, need for repair of tissues / organs, hernia, heart attack, death, and other risks were discussed.   The risks of no intervention will lead to serious  problems including death.   I expressed a good likelihood that surgery will address the problem.    Goals of post-operative recovery were discussed as well.  We will work to minimize complications although risks in an emergent setting are high.   Questions were answered.  The patient expressed understanding & wishes to proceed with surgery.      OR FINDINGS: Very dilated jejunum.  Transition point about 2 feet distal to the ligament of Treitz with 4 x 4 centimeter ellipsoid rocky intraluminal mass.  Suspicious for small bowel adenoma or cancer.  Not classic for carcinoid.  No evidence of visceral nor parietal carcinomatosis.  About a foot distal to ligament of Treitz, proximal to the obstructing lesion was a a phlegmon with the jejunum at the epicenter.  Partially walled off with proximal descending colon, jejunal mesentery and omentum -strongly suspicious for perforated jejunal diverticulitis contained with contiguous structures.  En bloc resection done  CASE DATA:  Type of patient?: LDOW CASE (Surgical Hospitalist [facility] Inpatient)  Status of Case? URGENT Add On  Infection Present At Time Of Surgery (PATOS)?  PHLEGMON  DESCRIPTION:   Informed consent was confirmed.  The patient underwent general anaesthesia without difficulty.  The patient was positioned appropriately.  VTE prevention in place.  The patient's abdomen was clipped, prepped, & draped in a sterile fashion.  Surgical timeout confirmed our plan.  Peritoneal entry with a laparoscopic port was obtained using optical entry technique in the right upper abdomen as the patient was positioned in reverse Trendelenburg.  Entry was clean.  I induced carbon dioxide insufflation.  Camera inspection revealed no injury.  Extra ports were carefully placed  under direct laparoscopic visualization.     Upon entering the abdomen (organ space), I encountered very dilated proximal bowel.  I found the ileocecal valve with decompressed terminal ileum.  Random  small bowel proximally.  Found a transition zone in the mid jejunum and could palpate a mostly fixed intraluminal mass ellipsoid.  Suspicious for an atypical adenoma.  Its firmness raise concern for cancer.  There is no stellate scarring nor grossly obvious small bowel lymphadenopathy that would raise the suspicion of carcinoid.  There is no evidence of any visceral nor parietal carcinomatosis.  I ran the small bowel proximally and contacted a phlegmon of omentum jejunum stuck to the retroperitoneum on the left side.  I transected omentum keeping a cuff of that adherent to the jejunum.  Elevated mobilized the jejunum off the proximal descending colon near the splenic flexure which was noninflamed.  Clearly there is a phlegmon involving the jejunum.  Seems suspicious for a jejunal diverticulitis.  Ran the very dilated small bowel foot more proximally to the ligament of Treitz.  Inspection revealed no other obvious abnormalities in the abdomen or peritoneal cavity.  Based on 2 focal abnormalities with a mass causing obstruction, I felt this warranted bowel resection.  I created a 7cm vertical very umbilical midline incision placed a wound protector..  Came through and placed a wound protector eviscerated the small bowel and confirmed the 2 abnormalities.  Ran the small bowel proximally to the ligament of Treitz confirmed no other abnormalities in the distally decompressed bowel.  Came proximally and confirmed thickened and dilated bowel to leave traits but no other abnormalities.   Nasogastric tube was only barely into the stomach.  I had anesthesia take it off suction advance the NGT another foot until it was well along the greater curvature with tip in the antrum.  Asked her to secured aggressively since the patient has already pulled it down once with his history of dementia and confusion   Located the area and did a side-to-side staple anastomosis with a 75 GIA antimesenteric.  Inspection good noted a good  staple line with good hemostasis.  Due to pull tip sucker to aspirate the very dilated small bowel to ligament of Treitz.  300 mL of thick feculent bilious material.   We stapled across the common defect with a TX 90 and a Barcelona style side-to-side stapled anastomosis.  2-0 silk sutures interrupted x2 at the proximal crotch of the anastomosis.  Transected through the intervening mesentery of the specimen using a Enseal vessel sealer to good result.  Closed the mesenteric defect transversely to cover up the entire T90 staple line.  It resulted in a good patent anastomosis.  Reinspect the abdomen and saw no other abnormalities or concerns.  We changed gloves and used sterile unused instruments from this point out.  Fascia closed with #1 PDS in a running fashion.  Ports removed.  I closed skin using 4-0 monocryl stitch.  Sterile dressings applied.  Patient was extubated and sent to the recovery room.   I suspect the patient is going used in the hospital at least overnight and will need antibiotics for 5 days. I discussed operative findings, updated the patient's status, discussed probable steps to recovery, and gave postoperative recommendations to the patient's spouse, Sheldon Sem.  Recommendations were made.  Questions were answered.  She expressed understanding & appreciation.    Adin Hector, M.D., F.A.C.S. Gastrointestinal and Minimally Invasive Surgery [facility] [phone]  [place], [place] [address] [phone] Main / Paging  10/10/2021 1:28 PM

## 2021-10-10 NOTE — Progress Notes (Signed)
[facility]  PROGRESS NOTE  Jason Stephenson GYF:[id number] DOB: 02-25-1946 DOA: 10/07/2021 PCP: Ginger Organ., MD   Brief HPI:   76 year old male with medical history of dementia presents to ED with complaints of abdominal pain.  He was seen in urgent care on 09/28/2021 for 2 days of abdominal pain, nausea and vomiting.  He was suspected to have viral gastroenteritis at that time and was back to his usual state by 10/01/2021.  He had brief abdominal discomfort few days later which was self-limited but then developed severe pain while eating chicken and rice for dinner nighttime.   In the ED he was found to be afebrile with normal heart rate, normal blood pressure.  Creatinine was 1.38, similar to 2019.  CT showed multiple dilated loops of small bowel with gradual transition distally concerning for partial small bowel obstruction.    Subjective   Patient seen and examined, denies any complaints.   Assessment/Plan:     Small bowel obstruction -Patient was kept n.p.o. and started on IV LR at 75 mill per hour -As he continued to have persistent SBO on abdominal x-ray  -General surgery was consulted  -General surgery feels that patient has SBO from unclear etiology and plan for laparoscopy today   CKD stage III a -Creatinine at baseline  Dementia -Stable -No behavior disturbance  Elevated blood pressure -Patient has no history of hypertension -Not on medications at home -Started on  IV hydralazine as needed  Medications     bupivacaine liposome  20 mL Infiltration Once   chlorhexidine  15 mL Mouth/Throat Once   Or   mouth rinse  15 mL Mouth Rinse Once   [MAR Hold] enoxaparin (LOVENOX) injection  40 mg Subcutaneous Daily     Data Reviewed:   CBG:  No results for input(s): GLUCAP in the last 168 hours.  SpO2: 95 %    Vitals:   10/09/21 1410 10/09/21 2140 10/10/21 0546 10/10/21 0950  BP: 132/70 126/64 140/69   Pulse: 62 66 65   Resp: '18 18 16    '$ Temp: 97.6 F (36.4 C) 98.9 F (37.2 C) 98.7 F (37.1 C)   TempSrc: Oral Oral Axillary   SpO2: 95% 95% 95%   Weight:    78 kg  Height:    '5\' 10"'$  (1.778 m)      Data Reviewed:  Basic Metabolic Panel: Recent Labs  Lab 10/07/21 2336 10/08/21 0535 10/09/21 0323 10/10/21 0331  NA 138 139 136 137  K 3.6 4.2 4.1 4.0  CL 105 104 103 103  CO2 '26 27 25 25  '$ GLUCOSE 106* 123* 117* 101*  BUN '20 20 19 20  '$ CREATININE 1.38* 1.39* 1.38* 1.28*  CALCIUM 8.5* 8.6* 8.4* 8.3*  MG  --  2.1  --   --     CBC: Recent Labs  Lab 10/07/21 2336 10/08/21 0535 10/09/21 0323 10/10/21 0331  WBC 9.7 17.1* 16.1* 12.4*  NEUTROABS 8.2*  --   --   --   HGB 15.9 14.4 13.5 14.1  HCT 45.6 40.9 40.4 40.2  MCV 91.8 91.7 94.2 92.8  PLT 400 368 331 344    LFT Recent Labs  Lab 10/07/21 2336  AST 21  ALT 23  ALKPHOS 109  BILITOT 0.7  PROT 6.7  ALBUMIN 3.3*     Antibiotics: Anti-infectives (From admission, onward)    Start     Dose/Rate Route Frequency Ordered Stop   10/10/21 0941  sodium chloride 0.9 %  with cefoTEtan (CEFOTAN) ADS Med       Note to Pharmacy: Karsten Ro: cabinet override      10/10/21 0941 10/10/21 1209   10/10/21 0900  cefoTEtan (CEFOTAN) 2 g in sodium chloride 0.9 % 100 mL IVPB        2 g 200 mL/hr over 30 Minutes Intravenous On call to O.R. 10/10/21 0806 10/10/21 1225        DVT prophylaxis: Lovenox  Code Status: Full code  Family Communication: No family at bedside   CONSULTS    Objective    Physical Examination:   General-appears in no acute distress Heart-S1-S2, regular, no murmur auscultated Lungs-clear to auscultation bilaterally, no wheezing or crackles auscultated Abdomen-soft, nontender, no organomegaly Extremities-no edema in the lower extremities Neuro-alert, oriented x3, no focal deficit noted  Status is: Inpatient: Small bowel obstruction           W[facility]   [facility] If 7PM-7AM, please contact  night-coverage at [email or web address], Office  [phone]   10/10/2021, 1:46 PM  LOS: 1 day

## 2021-10-10 NOTE — Progress Notes (Signed)
Jason Stephenson [id number] [birth year]  CARE TEAM:  PCP: Ginger Organ., MD  Outpatient Care Team: Patient Care Team: Ginger Organ., MD as PCP - General (Internal Medicine) Delice Lesch Lezlie Octave, MD as Consulting Physician (Neurology)  Inpatient Treatment Team: Treatment Team: Attending Provider: Oswald Hillock, MD; Rounding Team: Fatima Blank, MD; Consulting Physician: Edison Pace, Md, MD; Registered Nurse: Lisabeth Pick, RN; Utilization Review: Nicholes Rough, RN   Problem List:   Principal Problem:   Partial small bowel obstruction [facility]) Active Problems:   Stage 3a chronic kidney disease (CKD) ([place])   Dementia ([place])   SBO (small bowel obstruction) ([place])      * No surgery found *      Assessment  Persistent pain and distention in left upper quadrant with atypical CAT scan in a patient with obstruction with no prior abdominal surgery.  [facility] Stay = 1 days)  Plan:  -While his expiration shows a lack of complete obstruction, his small bowel still very distended.  He still has persistent tenderness with guarding in the left upper quadrant.  He is not a very reliable historian with his dementia.  Wife notes he has been complaining of pain all night and pulled his NG tube out.  I recommended abdominal exploration this morning.  Start out laparoscopically.  Hopefully is just a trapped band with a closed tube loop type situation but I worry about possible intussusception no other mass or something atypical explaining things.  May need to convert to open or at least be lap assisted with possible small bowel resection.  We will see.  The anatomy & physiology of the digestive tract was discussed.  The pathophysiology of intestinal obstruction was discussed.  Natural history risks without surgery was discussed.   I feel the patient has failed non-operative therapies.  The risks of no intervention will lead to serious problems such as necrosis, perforation, dehydration, etc.  that outweigh the operative risks; therefore, I recommended abdominal exploration to diagnose & treat the source of the problem.  Minimally Invasive & open techniques were discussed.   I expressed a good likelihood that surgery will treat the problem.  Risks such as bleeding, infection, abscess, leak, reoperation, bowel resection, possible ostomy, hernia, injury to other organs, need for repair of tissues / organs, need for further treatment, heart attack, death, and other risks were discussed.   I noted a good likelihood this will help address the problem.  Goals of post-operative recovery were discussed as well.  We will work to minimize complications. Questions were answered.  The patient and his wife express understanding & wish to proceed with surgery.  -VTE prophylaxis- SCDs, etc -mobilize as tolerated to help recovery  Disposition:  Disposition:  The patient is from: Home  Anticipate discharge to:  Home  Anticipated Date of Discharge is:  June 1,2023    Barriers to discharge:  Pending Clinical improvement (more likely than not)  Patient currently is NOT MEDICALLY STABLE for discharge from the hospital from a surgery standpoint.      I reviewed hospitalist notes, last 24 h vitals and pain scores, last 48 h intake and output, last 24 h labs and trends, and last 24 h imaging results. I have reviewed this patient's available data, including medical history, events of note, test results, etc as part of my evaluation.  A significant portion of that time was spent in counseling.  Care during the described time interval was provided by me.  This  care required moderate level of medical decision making.  10/10/2021    Subjective: (Chief complaint)  Patient getting confused and pulled NG tube out.  Wife has been staying at bedside and notes he has been complaining of pain or discomfort.  Patient initially denied pain but is clearly uncomfortable on exam.  Objective:  Vital  signs:  Vitals:   10/09/21 1410 10/09/21 1410 10/09/21 2140 10/10/21 0546  BP: 132/70 132/70 126/64 140/69  Pulse: 64 62 66 65  Resp: '18 18 18 16  '$ Temp: 97.6 F (36.4 C) 97.6 F (36.4 C) 98.9 F (37.2 C) 98.7 F (37.1 C)  TempSrc: Oral Oral Oral Axillary  SpO2: 95% 95% 95% 95%  Weight:      Height:        Last BM Date : 10/07/21  Intake/Output   Yesterday:  05/25 0701 - 05/26 0700 In: 689.2 [I.V.:689.2] Out: 1550 [Urine:350; Emesis/NG output:1200] This shift:  No intake/output data recorded.  Bowel function:  Flatus: No  BM:  No  Drain:  Canister full of very thick dark green/brown effluent.  Getting replaced.   Physical Exam:  General: Pt awake/alert in no acute distress Eyes: PERRL, normal EOM.  Sclera clear.  No icterus Neuro: CN II-XII intact w/o focal sensory/motor deficits. Lymph: No head/neck/groin lymphadenopathy Psych:  No delerium/psychosis/paranoia.  Oriented x 2 HENT: Normocephalic, Mucus membranes moist.  No thrush Neck: Supple, No tracheal deviation.  No obvious thyromegaly Chest: No pain to chest wall compression.  Good respiratory excursion.  No audible wheezing CV:  Pulses intact.  Regular rhythm.  No major extremity edema MS: Normal AROM mjr joints.  No obvious deformity  Abdomen: Somewhat firm.  Moderately distended.  Tenderness at left upper paramedian region with guarding. .   No incarcerated hernias.  Ext:   No deformity.  No mjr edema.  No cyanosis Skin: No petechiae / purpurea.  No major sores.  Warm and dry    Results:   Cultures: Recent Results (from the past 720 hour(s))  Blood Culture (routine x 2)     Status: None (Preliminary result)   Collection Time: 10/07/21 11:36 PM   Specimen: BLOOD  Result Value Ref Range Status   Specimen Description   Final    BLOOD RIGHT ANTECUBITAL Performed at [facility] [address], [place], [place] [address]    Special Requests   Final    BOTTLES DRAWN AEROBIC AND  ANAEROBIC Blood Culture results may not be optimal due to an excessive volume of blood received in culture bottles Performed at [facility] [address], [place], [place] [address]    Culture   Final    NO GROWTH 2 DAYS Performed at [facility] [address], [place], [place] [address]    Report Status PENDING  Incomplete  Blood Culture (routine x 2)     Status: None (Preliminary result)   Collection Time: 10/07/21 11:41 PM   Specimen: BLOOD  Result Value Ref Range Status   Specimen Description   Final    BLOOD BLOOD RIGHT WRIST Performed at [facility] [address], [place], [place] [address]    Special Requests   Final    BOTTLES DRAWN AEROBIC AND ANAEROBIC Blood Culture adequate volume Performed at [facility] [address], [place],  [address]    Culture   Final    NO GROWTH 2 DAYS Performed at [facility]  [address], [place], [place] [address]    Report Status PENDING  Incomplete  Urine Culture     Status: None   Collection Time: 10/08/21  4:45 AM   Specimen: In/Out Cath Urine  Result Value Ref Range Status   Specimen Description   Final    IN/OUT CATH URINE Performed at [facility] [address] [place], [place] [address]    Special Requests   Final    NONE Performed at [facility], [place] [address] [place], [place] [address]    Culture   Final    NO GROWTH Performed at [facility], [place] [address] [place], [place] [address]    Report Status 10/09/2021 FINAL  Final    Labs: Results for orders placed or performed during the hospital encounter of 10/07/21 (from the past 48 hour(s))  Basic metabolic panel     Status: Abnormal   Collection Time: 10/09/21  3:23 AM  Result Value Ref Range   Sodium 136 135 - 145 mmol/L   Potassium 4.1 3.5 - 5.1 mmol/L   Chloride 103 98 - 111 mmol/L   CO2 25 22 - 32 mmol/L   Glucose, Bld  117 (H) 70 - 99 mg/dL    Comment: Glucose reference range applies only to samples taken after fasting for at least 8 hours.   BUN 19 8 - 23 mg/dL   Creatinine, Ser 1.38 (H) 0.61 - 1.24 mg/dL   Calcium 8.4 (L) 8.9 - 10.3 mg/dL   GFR, Estimated 53 (L) >60 mL/min    Comment: (NOTE) Calculated using the CKD-EPI Creatinine Equation (2021)    Anion gap 8 5 - 15    Comment: Performed at [facility], [place] [address], [place], [place] [address]  CBC     Status: Abnormal   Collection Time: 10/09/21  3:23 AM  Result Value Ref Range   WBC 16.1 (H) 4.0 - 10.5 K/uL   RBC 4.29 4.22 - 5.81 MIL/uL   Hemoglobin 13.5 13.0 - 17.0 g/dL   HCT 40.4 39.0 - 52.0 %   MCV 94.2 80.0 - 100.0 fL   MCH 31.5 26.0 - 34.0 pg   MCHC 33.4 30.0 - 36.0 g/dL   RDW 12.0 11.5 - 15.5 %   Platelets 331 150 - 400 K/uL   nRBC 0.0 0.0 - 0.2 %    Comment: Performed at [facility], [place] [address], [place], [place] [address]  Lactic acid, plasma     Status: None   Collection Time: 10/09/21  1:17 PM  Result Value Ref Range   Lactic Acid, Venous 1.6 0.5 - 1.9 mmol/L    Comment: Performed at [facility], [place] [address], [place], [place] [address]  Basic metabolic panel     Status: Abnormal   Collection Time: 10/10/21  3:31 AM  Result Value Ref Range   Sodium 137 135 - 145 mmol/L   Potassium 4.0 3.5 - 5.1 mmol/L   Chloride 103 98 - 111 mmol/L   CO2 25 22 - 32 mmol/L   Glucose, Bld 101 (H) 70 - 99 mg/dL    Comment: Glucose reference range applies only to samples taken after fasting for at least 8 hours.   BUN 20 8 - 23 mg/dL   Creatinine, Ser 1.28 (H) 0.61 - 1.24 mg/dL   Calcium 8.3 (L) 8.9 - 10.3 mg/dL   GFR, Estimated 58 (L) >60 mL/min    Comment: (NOTE) Calculated using  the CKD-EPI Creatinine Equation (2021)    Anion gap 9 5 - 15    Comment: Performed at [facility], [place] [address] [place], [place] [address]  CBC     Status: Abnormal    Collection Time: 10/10/21  3:31 AM  Result Value Ref Range   WBC 12.4 (H) 4.0 - 10.5 K/uL   RBC 4.33 4.22 - 5.81 MIL/uL   Hemoglobin 14.1 13.0 - 17.0 g/dL   HCT 40.2 39.0 - 52.0 %   MCV 92.8 80.0 - 100.0 fL   MCH 32.6 26.0 - 34.0 pg   MCHC 35.1 30.0 - 36.0 g/dL   RDW 11.9 11.5 - 15.5 %   Platelets 344 150 - 400 K/uL   nRBC 0.0 0.0 - 0.2 %    Comment: Performed at [facility], [place] [address], [place], [place] [address]    Imaging / Studies: DG Abd 1 View  Result Date: 10/09/2021 CLINICAL DATA:  Small bowel obstruction EXAM: ABDOMEN - 1 VIEW COMPARISON:  CT abdomen pelvis, 10/08/2021 FINDINGS: Gas distended loops of small bowel in the central abdomen measuring up to 5.6 cm in caliber. Little if any colonic gas appreciated. No large burden of stool. No obvious free air on supine radiographs. No radio-opaque calculi or other significant radiographic abnormality are seen. IMPRESSION: Gas distended loops of small bowel in the central abdomen measuring up to 5.6 cm in caliber. Little if any colonic gas appreciated. Findings are consistent with small bowel obstruction. Electronically Signed   By: Delanna Ahmadi M.D.   On: 10/09/2021 10:31   DG Abd Portable 1V-Small Bowel Protocol-Position Verification  Result Date: 10/09/2021 CLINICAL DATA:  NG tube placement EXAM: PORTABLE ABDOMEN - 1 VIEW COMPARISON:  10/09/2021, 9:10 a.m. FINDINGS: Interval placement of esophagogastric tube, tip and side port below the diaphragm. Distention of small-bowel is unchanged at approximately 5.1 cm caliber. No free air on erect radiographs. IMPRESSION: 1. Interval placement of esophagogastric tube, tip and side port below the diaphragm. 2. Distention of small-bowel is unchanged, consistent with small bowel obstruction. No free air on erect radiographs. Electronically Signed   By: Delanna Ahmadi M.D.   On: 10/09/2021 13:21   DG Abd Portable 1V-Small Bowel Obstruction Protocol-initial, 8 hr  delay  Result Date: 10/09/2021 CLINICAL DATA:  8 hour delay film, NG tube placement. EXAM: PORTABLE ABDOMEN - 1 VIEW COMPARISON:  10/09/2021. FINDINGS: There is redemonstration of multiple gas-filled dilated loops of small bowel in the abdomen measuring up to 5.2 cm in diameter. Contrast is present in the colon on delayed imaging. Air is seen in the rectum. An enteric tube terminates in the stomach. No acute osseous abnormality is identified. IMPRESSION: Persistent dilatation of multiple loops of small bowel in the abdomen measuring up to 5.2 cm. Contrast is identified in the colon on delayed imaging suggesting ileus versus partial obstruction. Electronically Signed   By: Brett Fairy M.D.   On: 10/09/2021 22:43    Medications / Allergies: per chart  Antibiotics: Anti-infectives (From admission, onward)    Start     Dose/Rate Route Frequency Ordered Stop   10/10/21 0900  cefoTEtan (CEFOTAN) 2 g in sodium chloride 0.9 % 100 mL IVPB        2 g 200 mL/hr over 30 Minutes Intravenous On call to O.R. 10/10/21 0806 10/11/21 0559         Note: Portions of this report may have been transcribed using voice recognition software. Every effort was made  to ensure accuracy; however, inadvertent computerized transcription errors may be present.   Any transcriptional errors that result from this process are unintentional.    Adin Hector, MD, FACS, MASCRS Esophageal, Gastrointestinal & Colorectal Surgery Robotic and Minimally Invasive Surgery  [facility], [employer] [address], [place], [place] [address] [phone] Fax [phone] Main  CONTACT INFORMATION:  Weekday (9AM-5PM): Call [facility] main office at [phone]  Weeknight (5PM-9AM) or Weekend/Holiday: Check [email or web address] (password " [email or web address]") for General Surgery [facility] coverage  (Please, do not use SecureChat as it is not reliable communication to operating surgeons for  immediate patient care)      10/10/2021  8:08 AM

## 2021-10-11 ENCOUNTER — Inpatient Hospital Stay (HOSPITAL_COMMUNITY): Payer: Medicare Other

## 2021-10-11 ENCOUNTER — Encounter (HOSPITAL_COMMUNITY): Payer: Self-pay | Admitting: Surgery

## 2021-10-11 DIAGNOSIS — C179 Malignant neoplasm of small intestine, unspecified: Secondary | ICD-10-CM | POA: Diagnosis not present

## 2021-10-11 DIAGNOSIS — K5700 Diverticulitis of small intestine with perforation and abscess without bleeding: Secondary | ICD-10-CM | POA: Diagnosis not present

## 2021-10-11 DIAGNOSIS — N1831 Chronic kidney disease, stage 3a: Secondary | ICD-10-CM | POA: Diagnosis not present

## 2021-10-11 DIAGNOSIS — K56600 Partial intestinal obstruction, unspecified as to cause: Secondary | ICD-10-CM | POA: Diagnosis not present

## 2021-10-11 LAB — CBC
HCT: 41.3 % (ref 39.0–52.0)
Hemoglobin: 14.2 g/dL (ref 13.0–17.0)
MCH: 31.5 pg (ref 26.0–34.0)
MCHC: 34.4 g/dL (ref 30.0–36.0)
MCV: 91.6 fL (ref 80.0–100.0)
Platelets: 436 10*3/uL — ABNORMAL HIGH (ref 150–400)
RBC: 4.51 MIL/uL (ref 4.22–5.81)
RDW: 11.9 % (ref 11.5–15.5)
WBC: 19.4 10*3/uL — ABNORMAL HIGH (ref 4.0–10.5)
nRBC: 0 % (ref 0.0–0.2)

## 2021-10-11 LAB — MAGNESIUM: Magnesium: 2.3 mg/dL (ref 1.7–2.4)

## 2021-10-11 MED ORDER — MEMANTINE HCL 10 MG PO TABS
10.0000 mg | ORAL_TABLET | Freq: Two times a day (BID) | ORAL | Status: DC
  Administered 2021-10-11 – 2021-10-16 (×11): 10 mg via ORAL
  Filled 2021-10-11 (×11): qty 1

## 2021-10-11 MED ORDER — MORPHINE SULFATE (PF) 2 MG/ML IV SOLN
2.0000 mg | INTRAVENOUS | Status: DC | PRN
  Administered 2021-10-11 – 2021-10-13 (×6): 2 mg via INTRAVENOUS
  Filled 2021-10-11 (×7): qty 1

## 2021-10-11 MED ORDER — FINASTERIDE 5 MG PO TABS
5.0000 mg | ORAL_TABLET | Freq: Every day | ORAL | Status: DC
  Administered 2021-10-11 – 2021-10-16 (×6): 5 mg via ORAL
  Filled 2021-10-11 (×6): qty 1

## 2021-10-11 MED ORDER — DONEPEZIL HCL 10 MG PO TABS
10.0000 mg | ORAL_TABLET | Freq: Every day | ORAL | Status: DC
  Administered 2021-10-11 – 2021-10-15 (×5): 10 mg via ORAL
  Filled 2021-10-11 (×5): qty 1

## 2021-10-11 NOTE — Progress Notes (Signed)
[facility]  PROGRESS NOTE  Jason Stephenson EUM:[id number] DOB: 1945-07-30 DOA: 10/07/2021 PCP: Ginger Organ., MD   Brief HPI:   76 year old male with medical history of dementia presents to ED with complaints of abdominal pain.  He was seen in urgent care on 09/28/2021 for 2 days of abdominal pain, nausea and vomiting.  He was suspected to have viral gastroenteritis at that time and was back to his usual state by 10/01/2021.  He had brief abdominal discomfort few days later which was self-limited but then developed severe pain while eating chicken and rice for dinner nighttime.   In the ED he was found to be afebrile with normal heart rate, normal blood pressure.  Creatinine was 1.38, similar to 2019.  CT showed multiple dilated loops of small bowel with gradual transition distally concerning for partial small bowel obstruction.  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  Underwent laparoscopic small bowel resection.    Subjective   Patient seen and examined, denies any complaints.  No bowel movement yet.   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  SpO2: 95 % O2 Flow Rate (L/min): 7 L/min    Vitals:   10/10/21 2103 10/11/21 0153 10/11/21 0553 10/11/21 0928  BP: (!) 153/82 (!) 141/76 (!) 156/86 (!) 148/71  Pulse: 78 (!) 59 62 61  Resp: '16 18 16 16  '$ Temp: 98.5 F (36.9 C) 98 F (36.7 C) 97.8 F (36.6 C) 98 F (36.7 C)  TempSrc: Oral Oral Oral Oral  SpO2: 95% 97% 96% 95%  Weight:      Height:          Data Reviewed:  Basic Metabolic Panel: Recent Labs  Lab 10/07/21 2336 10/08/21 0535 10/09/21 0323 10/10/21 0331 10/11/21 0409  NA 138 139 136 137  --   K 3.6 4.2 4.1 4.0  --   CL 105 104 103 103  --   CO2 '26 27 25 25  '$ --   GLUCOSE 106* 123* 117* 101*  --   BUN '20 20 19 20  '$ --   CREATININE 1.38* 1.39* 1.38* 1.28*  --   CALCIUM 8.5* 8.6* 8.4* 8.3*  --   MG  --  2.1  --   --  2.3    CBC: Recent Labs  Lab 10/07/21 2336 10/08/21 0535 10/09/21 0323 10/10/21 0331 10/11/21 0409  WBC 9.7 17.1* 16.1* 12.4* 19.4*  NEUTROABS 8.2*  --   --   --   --   HGB 15.9 14.4 13.5 14.1 14.2  HCT 45.6 40.9 40.4 40.2 41.3  MCV 91.8 91.7 94.2 92.8 91.6  PLT 400 368 331 344 436*    LFT Recent Labs  Lab 10/07/21 2336  AST 21  ALT 23  ALKPHOS 109  BILITOT 0.7  PROT 6.7  ALBUMIN 3.3*     Antibiotics: Anti-infectives (From admission, onward)    Start     Dose/Rate Route Frequency Ordered Stop   10/10/21 0941  sodium chloride 0.9 % with cefoTEtan (CEFOTAN) ADS Med       Note to Pharmacy: Karsten Ro: cabinet override      10/10/21 0941 10/10/21 1209   10/10/21 0900  cefoTEtan (CEFOTAN) 2 g in sodium chloride 0.9 % 100 mL IVPB        2 g 200 mL/hr over 30 Minutes Intravenous On call to O.R. 10/10/21 0806 10/10/21 1225        DVT prophylaxis: Lovenox  Code Status: Full code  Family Communication: Discussed with wife at bedside   CONSULTS    Objective    Physical Examination:   General-appears in no acute  distress Heart-S1-S2, regular, no murmur auscultated Lungs-clear to auscultation bilaterally, no wheezing or crackles auscultated Abdomen-soft, nontender, no organomegaly Extremities-no edema in the lower extremities Neuro-alert, oriented x 2, no focal deficit noted  Status is: Inpatient: Small bowel obstruction           [place]   [facility] If 7PM-7AM, please contact night-coverage at [email or web address], Office  [phone]   10/11/2021, 12:03 PM  LOS: 2 days

## 2021-10-11 NOTE — Progress Notes (Signed)
1 Day Post-Op   Subjective/Chief Complaint: Patient mildly confused Wife at the bedside He removed his NG tube again this morning, despite bilateral wrist restraints No nausea or vomiting Complaining of abdominal pain   Objective: Vital signs in last 24 hours: Temp:  [97.8 F (36.6 C)-98.6 F (37 C)] 97.8 F (36.6 C) (05/27 0553) Pulse Rate:  [59-78] 62 (05/27 0553) Resp:  [13-18] 16 (05/27 0553) BP: (141-161)/(70-86) 156/86 (05/27 0553) SpO2:  [94 %-100 %] 96 % (05/27 0553) Weight:  [78 kg] 78 kg (05/26 0950) Last BM Date : 10/07/21  Intake/Output from previous day: 05/26 0701 - 05/27 0700 In: 385.7 [I.V.:285.7; IV Piggyback:100] Out: 1230 [Urine:700; Emesis/NG output:480; Blood:50] Intake/Output this shift: No intake/output data recorded.  General appearance: alert, cooperative, and slowed mentation GI: soft, mildly distended; incisional tenderness Dressings clean and dry  Lab Results:  Recent Labs    10/10/21 0331 10/11/21 0409  WBC 12.4* 19.4*  HGB 14.1 14.2  HCT 40.2 41.3  PLT 344 436*   BMET Recent Labs    10/09/21 0323 10/10/21 0331  NA 136 137  K 4.1 4.0  CL 103 103  CO2 25 25  GLUCOSE 117* 101*  BUN 19 20  CREATININE 1.38* 1.28*  CALCIUM 8.4* 8.3*   PT/INR No results for input(s): LABPROT, INR in the last 72 hours. ABG No results for input(s): PHART, HCO3 in the last 72 hours.  Invalid input(s): PCO2, PO2  Studies/Results: DG Abd 1 View  Result Date: 10/09/2021 CLINICAL DATA:  Small bowel obstruction EXAM: ABDOMEN - 1 VIEW COMPARISON:  CT abdomen pelvis, 10/08/2021 FINDINGS: Gas distended loops of small bowel in the central abdomen measuring up to 5.6 cm in caliber. Little if any colonic gas appreciated. No large burden of stool. No obvious free air on supine radiographs. No radio-opaque calculi or other significant radiographic abnormality are seen. IMPRESSION: Gas distended loops of small bowel in the central abdomen measuring up to 5.6  cm in caliber. Little if any colonic gas appreciated. Findings are consistent with small bowel obstruction. Electronically Signed   By: Delanna Ahmadi M.D.   On: 10/09/2021 10:31   DG Abd Portable 1V-Small Bowel Protocol-Position Verification  Result Date: 10/09/2021 CLINICAL DATA:  NG tube placement EXAM: PORTABLE ABDOMEN - 1 VIEW COMPARISON:  10/09/2021, 9:10 a.m. FINDINGS: Interval placement of esophagogastric tube, tip and side port below the diaphragm. Distention of small-bowel is unchanged at approximately 5.1 cm caliber. No free air on erect radiographs. IMPRESSION: 1. Interval placement of esophagogastric tube, tip and side port below the diaphragm. 2. Distention of small-bowel is unchanged, consistent with small bowel obstruction. No free air on erect radiographs. Electronically Signed   By: Delanna Ahmadi M.D.   On: 10/09/2021 13:21   DG Abd Portable 1V-Small Bowel Obstruction Protocol-initial, 8 hr delay  Result Date: 10/09/2021 CLINICAL DATA:  8 hour delay film, NG tube placement. EXAM: PORTABLE ABDOMEN - 1 VIEW COMPARISON:  10/09/2021. FINDINGS: There is redemonstration of multiple gas-filled dilated loops of small bowel in the abdomen measuring up to 5.2 cm in diameter. Contrast is present in the colon on delayed imaging. Air is seen in the rectum. An enteric tube terminates in the stomach. No acute osseous abnormality is identified. IMPRESSION: Persistent dilatation of multiple loops of small bowel in the abdomen measuring up to 5.2 cm. Contrast is identified in the colon on delayed imaging suggesting ileus versus partial obstruction. Electronically Signed   By: Brett Fairy M.D.   On: 10/09/2021  22:43    Anti-infectives: Anti-infectives (From admission, onward)    Start     Dose/Rate Route Frequency Ordered Stop   10/10/21 0941  sodium chloride 0.9 % with cefoTEtan (CEFOTAN) ADS Med       Note to Pharmacy: Karsten Ro: cabinet override      10/10/21 0941 10/10/21 1209   10/10/21  0900  cefoTEtan (CEFOTAN) 2 g in sodium chloride 0.9 % 100 mL IVPB        2 g 200 mL/hr over 30 Minutes Intravenous On call to O.R. 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 Johney Maine d/c foley Replace NG tube - renew soft wrist restraints Ice chips Await return of bowel function  LOS: 2 days    [name] 10/11/2021

## 2021-10-11 NOTE — Progress Notes (Signed)
rm 1311, Kerney Fullwood SBO s/p Jejunal Resection 5/26. Pt. confused/dementia, in Bil wrist restraints and Pulled out NGT.  Patient was in bilateral wrist restraints, but he sat up and bent forward and pulled out NGT.  Nurse notified, Dr. Georgette Dover on call and "he  said "that's a problem thank you " No new orders.Marland Kitchen

## 2021-10-11 NOTE — Progress Notes (Signed)
Verbal order by MD Barry Dienes to leave NGT out for tonight.

## 2021-10-11 NOTE — Progress Notes (Signed)
MD Barry Dienes was paged because patient pulled out his NGT, this is the third during this admission. No c/o N/V. Patient currently in restraints and trying to pull at condom cath, he is confused and not easily reoriented.

## 2021-10-12 DIAGNOSIS — N1831 Chronic kidney disease, stage 3a: Secondary | ICD-10-CM | POA: Diagnosis not present

## 2021-10-12 DIAGNOSIS — K5700 Diverticulitis of small intestine with perforation and abscess without bleeding: Secondary | ICD-10-CM | POA: Diagnosis not present

## 2021-10-12 DIAGNOSIS — C179 Malignant neoplasm of small intestine, unspecified: Secondary | ICD-10-CM | POA: Diagnosis not present

## 2021-10-12 DIAGNOSIS — K56600 Partial intestinal obstruction, unspecified as to cause: Secondary | ICD-10-CM | POA: Diagnosis not present

## 2021-10-12 LAB — COMPREHENSIVE METABOLIC PANEL
ALT: 15 U/L (ref 0–44)
AST: 19 U/L (ref 15–41)
Albumin: 2.8 g/dL — ABNORMAL LOW (ref 3.5–5.0)
Alkaline Phosphatase: 81 U/L (ref 38–126)
Anion gap: 9 (ref 5–15)
BUN: 23 mg/dL (ref 8–23)
CO2: 27 mmol/L (ref 22–32)
Calcium: 8.2 mg/dL — ABNORMAL LOW (ref 8.9–10.3)
Chloride: 103 mmol/L (ref 98–111)
Creatinine, Ser: 1.29 mg/dL — ABNORMAL HIGH (ref 0.61–1.24)
GFR, Estimated: 58 mL/min — ABNORMAL LOW (ref >60)
Glucose, Bld: 88 mg/dL (ref 70–99)
Potassium: 3.5 mmol/L (ref 3.5–5.1)
Sodium: 139 mmol/L (ref 135–145)
Total Bilirubin: 0.6 mg/dL (ref 0.3–1.2)
Total Protein: 6.2 g/dL — ABNORMAL LOW (ref 6.5–8.1)

## 2021-10-12 LAB — CBC
HCT: 40.1 % (ref 39.0–52.0)
Hemoglobin: 13.5 g/dL (ref 13.0–17.0)
MCH: 31.3 pg (ref 26.0–34.0)
MCHC: 33.7 g/dL (ref 30.0–36.0)
MCV: 92.8 fL (ref 80.0–100.0)
Platelets: 469 10*3/uL — ABNORMAL HIGH (ref 150–400)
RBC: 4.32 MIL/uL (ref 4.22–5.81)
RDW: 11.9 % (ref 11.5–15.5)
WBC: 12.9 10*3/uL — ABNORMAL HIGH (ref 4.0–10.5)
nRBC: 0 % (ref 0.0–0.2)

## 2021-10-12 NOTE — Progress Notes (Signed)
[facility]  PROGRESS NOTE  Jason Stephenson TDV:[id number] DOB: 1945-08-31 DOA: 10/07/2021 PCP: Ginger Organ., MD   Brief HPI:   76 year old male with medical history of dementia presents to ED with complaints of abdominal pain.  He was seen in urgent care on 09/28/2021 for 2 days of abdominal pain, nausea and vomiting.  He was suspected to have viral gastroenteritis at that time and was back to his usual state by 10/01/2021.  He had brief abdominal discomfort few days later which was self-limited but then developed severe pain while eating chicken and rice for dinner nighttime.   In the ED he was found to be afebrile with normal heart rate, normal blood pressure.  Creatinine was 1.38, similar to 2019.  CT showed multiple dilated loops of small bowel with gradual transition distally concerning for partial small bowel obstruction.  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  Underwent laparoscopic small bowel resection.    Subjective   Patient seen and examined, pulled out NG tube.  Surgery does not plan to replace NG tube.  Started on clear liquid diet.  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, underwent laparoscopic small bowel resection -NG tube was placed; patient pulled out NG tube twice -Started on clear liquid diet per surgery -Will monitor   CKD stage III a -Creatinine at baseline  Dementia -Stable -No behavior disturbance -Soft restraints bilaterally as needed to prevent pulling out IVs  Elevated blood pressure -Patient has no history of hypertension -Not  on medications at home -Started on  IV hydralazine as needed  Medications     donepezil  10 mg Oral QHS   enoxaparin (LOVENOX) injection  40 mg Subcutaneous Daily   finasteride  5 mg Oral Daily   memantine  10 mg Oral BID     Data Reviewed:   CBG:  No results for input(s): GLUCAP in the last 168 hours.  SpO2: 96 % O2 Flow Rate (L/min): 7 L/min    Vitals:   10/11/21 0928 10/11/21 1418 10/11/21 2015 10/12/21 0642  BP: (!) 148/71 (!) 153/116 (!) 147/96 (!) 158/75  Pulse: 61 62 62 60  Resp: '16  16 14  '$ Temp: 98 F (36.7 C) 98.1 F (36.7 C) 98.2 F (36.8 C) 98.2 F (36.8 C)  TempSrc: Oral Oral Oral Oral  SpO2: 95% 96% 96% 96%  Weight:      Height:          Data Reviewed:  Basic Metabolic Panel: Recent Labs  Lab 10/07/21 2336 10/08/21 0535 10/09/21 0323 10/10/21 0331 10/11/21 0409 10/12/21 0405  NA 138 139 136 137  --  139  K 3.6 4.2 4.1 4.0  --  3.5  CL 105 104 103 103  --  103  CO2 '26 27 25 25  '$ --  27  GLUCOSE 106* 123* 117* 101*  --  88  BUN '20 20 19 20  '$ --  23  CREATININE 1.38* 1.39* 1.38* 1.28*  --  1.29*  CALCIUM 8.5* 8.6* 8.4* 8.3*  --  8.2*  MG  --  2.1  --   --  2.3  --     CBC: Recent Labs  Lab 10/07/21 2336 10/08/21 0535 10/09/21 0323 10/10/21 0331 10/11/21 0409 10/12/21 0405  WBC 9.7 17.1* 16.1* 12.4* 19.4* 12.9*  NEUTROABS 8.2*  --   --   --   --   --   HGB 15.9 14.4 13.5 14.1 14.2 13.5  HCT 45.6 40.9 40.4 40.2 41.3 40.1  MCV 91.8 91.7 94.2 92.8 91.6 92.8  PLT 400 368 331 344 436* 469*    LFT Recent Labs  Lab 10/07/21 2336 10/12/21 0405  AST 21 19  ALT 23 15  ALKPHOS 109 81  BILITOT 0.7 0.6  PROT 6.7 6.2*  ALBUMIN 3.3* 2.8*     Antibiotics: Anti-infectives (From admission, onward)    Start     Dose/Rate Route Frequency Ordered Stop   10/10/21 0941  sodium chloride 0.9 % with cefoTEtan (CEFOTAN) ADS Med       Note to Pharmacy: Karsten Ro: cabinet override      10/10/21 0941 10/10/21 1209   10/10/21 0900   cefoTEtan (CEFOTAN) 2 g in sodium chloride 0.9 % 100 mL IVPB        2 g 200 mL/hr over 30 Minutes Intravenous On call to O.R. 10/10/21 0806 10/10/21 1225        DVT prophylaxis: Lovenox  Code Status: Full code  Family Communication: Discussed with wife at bedside   CONSULTS    Objective    Physical Examination:   General-appears in no acute distress Heart-S1-S2, regular, no murmur auscultated Lungs-clear to auscultation bilaterally, no wheezing or crackles auscultated Abdomen-soft, nontender, no organomegaly Extremities-no edema in the lower extremities Neuro-alert, oriented to self only , no focal deficit noted  Status is: Inpatient: Small bowel obstruction           [place]   [facility] If 7PM-7AM, please contact night-coverage at [email or web address], Office  [phone]   10/12/2021, 9:56 AM  LOS: 3 days

## 2021-10-12 NOTE — Progress Notes (Signed)
Pt's daughter Ebony Hail here to visit w her dad, states the family was never told that the pt was in the hospital and has had surgery. Daughter w multiple questions and is requesting that the hospitalist call her so that she can ask questions and get an update at [phone].

## 2021-10-12 NOTE — Progress Notes (Signed)
Pt very confused today, talking jiberish at times,  has attempted numerous times to get OOB and also has gotten out of the recliner repeatedly. Attempted to reorient pt, given repeated instructions but pt unable to follow even simple commands. Wife at bedside (asleep in the bed) as pt was in recliner and did not arouse in spite of repeated chair alarms, staff in the room talking w pt, etc. MD notified and received reorder for soft restraints for pt safety. Will cont to monitor

## 2021-10-12 NOTE — Progress Notes (Signed)
2 Days Post-Op   Subjective/Chief Complaint: Pleasant dementia Removed NG tube again No reported BM - he can't remember if he passed any flatus No abdominal pain today   Objective: Vital signs in last 24 hours: Temp:  [98 F (36.7 C)-98.2 F (36.8 C)] 98.2 F (36.8 C) (05/28 0642) Pulse Rate:  [60-62] 60 (05/28 0642) Resp:  [14-16] 14 (05/28 0642) BP: (147-158)/(71-116) 158/75 (05/28 0642) SpO2:  [95 %-96 %] 96 % (05/28 0642) Last BM Date : 10/07/21  Intake/Output from previous day: 05/27 0701 - 05/28 0700 In: 0  Out: 1350 [Urine:1250; Emesis/NG output:100] Intake/Output this shift: No intake/output data recorded.  General appearance: alert, cooperative, and slowed mentation GI: soft, non-distended; minimal tenderness Dressings clean and dry  Lab Results:  Recent Labs    10/11/21 0409 10/12/21 0405  WBC 19.4* 12.9*  HGB 14.2 13.5  HCT 41.3 40.1  PLT 436* 469*   BMET Recent Labs    10/10/21 0331 10/12/21 0405  NA 137 139  K 4.0 3.5  CL 103 103  CO2 25 27  GLUCOSE 101* 88  BUN 20 23  CREATININE 1.28* 1.29*  CALCIUM 8.3* 8.2*   PT/INR No results for input(s): LABPROT, INR in the last 72 hours. ABG No results for input(s): PHART, HCO3 in the last 72 hours.  Invalid input(s): PCO2, PO2  Studies/Results: DG Abd Portable 1V  Result Date: 10/11/2021 CLINICAL DATA:  Status post NG tube placement. EXAM: PORTABLE ABDOMEN - 1 VIEW COMPARISON:  Radiographs dated Oct 09, 2021 FINDINGS: NG tube with side port in the distal esophagus and distal tip just below the GE junction, it could be advanced 7-8 cm. Partially imaged abdomen demonstrate contrast fills small bowel loops. Abdomen not completely imaged. Cholecystectomy clips are noted. No acute osseous abnormality. IMPRESSION: NG tube with side port in the distal esophagus and tip just below the GE junction, it could be advanced 7-8 cm. Electronically Signed   By: Keane Police D.O.   On: 10/11/2021 12:55     Anti-infectives: Anti-infectives (From admission, onward)    Start     Dose/Rate Route Frequency Ordered Stop   10/10/21 0941  sodium chloride 0.9 % with cefoTEtan (CEFOTAN) ADS Med       Note to Pharmacy: Karsten Ro: cabinet override      10/10/21 0941 10/10/21 1209   10/10/21 0900  cefoTEtan (CEFOTAN) 2 g in sodium chloride 0.9 % 100 mL IVPB        2 g 200 mL/hr over 30 Minutes Intravenous On call to O.R. 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 Johney Maine d/c foley - condom cath in place Abdomen seems much less distended - leave NG tube out Clear liquids Should not need wrist restraints unless he start pulling at his IV  LOS: 3 days    Jason Stephenson 10/12/2021

## 2021-10-13 DIAGNOSIS — K5700 Diverticulitis of small intestine with perforation and abscess without bleeding: Secondary | ICD-10-CM | POA: Diagnosis not present

## 2021-10-13 DIAGNOSIS — N1831 Chronic kidney disease, stage 3a: Secondary | ICD-10-CM | POA: Diagnosis not present

## 2021-10-13 DIAGNOSIS — K56600 Partial intestinal obstruction, unspecified as to cause: Secondary | ICD-10-CM | POA: Diagnosis not present

## 2021-10-13 DIAGNOSIS — C179 Malignant neoplasm of small intestine, unspecified: Secondary | ICD-10-CM | POA: Diagnosis not present

## 2021-10-13 LAB — CULTURE, BLOOD (ROUTINE X 2)
Culture: NO GROWTH
Culture: NO GROWTH
Special Requests: ADEQUATE

## 2021-10-13 MED ORDER — OXYCODONE HCL 5 MG PO TABS
5.0000 mg | ORAL_TABLET | ORAL | Status: DC | PRN
  Administered 2021-10-13 – 2021-10-14 (×3): 5 mg via ORAL
  Filled 2021-10-13 (×3): qty 1

## 2021-10-13 MED ORDER — METHOCARBAMOL 500 MG PO TABS
500.0000 mg | ORAL_TABLET | Freq: Three times a day (TID) | ORAL | Status: DC | PRN
  Administered 2021-10-13: 500 mg via ORAL
  Filled 2021-10-13 (×2): qty 1

## 2021-10-13 NOTE — Progress Notes (Signed)
3 Days Post-Op   Subjective/Chief Complaint: Patient tolerated clear liquids without any nausea or vomiting No BM recorded Unsure whether he passed any flatus Patient still mildly confused   Objective: Vital signs in last 24 hours: Temp:  [97.5 F (36.4 C)-98 F (36.7 C)] 97.8 F (36.6 C) (05/29 0655) Pulse Rate:  [49-57] 49 (05/29 0655) Resp:  [16-18] 18 (05/29 0655) BP: (143-154)/(76-94) 147/76 (05/29 0655) SpO2:  [92 %-97 %] 96 % (05/29 0655) Last BM Date : 10/07/21  Intake/Output from previous day: 05/28 0701 - 05/29 0700 In: 840 [P.O.:840] Out: 1150 [Urine:1150] Intake/Output this shift: Total I/O In: 230 [P.O.:230] Out: -   General appearance: alert, cooperative, and slowed mentation GI: soft, non-distended; minimal tenderness Dressings clean and dry  Lab Results:  Recent Labs    10/11/21 0409 10/12/21 0405  WBC 19.4* 12.9*  HGB 14.2 13.5  HCT 41.3 40.1  PLT 436* 469*   BMET Recent Labs    10/12/21 0405  NA 139  K 3.5  CL 103  CO2 27  GLUCOSE 88  BUN 23  CREATININE 1.29*  CALCIUM 8.2*   PT/INR No results for input(s): LABPROT, INR in the last 72 hours. ABG No results for input(s): PHART, HCO3 in the last 72 hours.  Invalid input(s): PCO2, PO2  Studies/Results: DG Abd Portable 1V  Result Date: 10/11/2021 CLINICAL DATA:  Status post NG tube placement. EXAM: PORTABLE ABDOMEN - 1 VIEW COMPARISON:  Radiographs dated Oct 09, 2021 FINDINGS: NG tube with side port in the distal esophagus and distal tip just below the GE junction, it could be advanced 7-8 cm. Partially imaged abdomen demonstrate contrast fills small bowel loops. Abdomen not completely imaged. Cholecystectomy clips are noted. No acute osseous abnormality. IMPRESSION: NG tube with side port in the distal esophagus and tip just below the GE junction, it could be advanced 7-8 cm. Electronically Signed   By: Keane Police D.O.   On: 10/11/2021 12:55    Anti-infectives: Anti-infectives  (From admission, onward)    Start     Dose/Rate Route Frequency Ordered Stop   10/10/21 0941  sodium chloride 0.9 % with cefoTEtan (CEFOTAN) ADS Med       Note to Pharmacy: Karsten Ro: cabinet override      10/10/21 0941 10/10/21 1209   10/10/21 0900  cefoTEtan (CEFOTAN) 2 g in sodium chloride 0.9 % 100 mL IVPB        2 g 200 mL/hr over 30 Minutes Intravenous On call to O.R. 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 Johney Maine Pathology still pending d/c foley - condom cath in place Abdomen seems much less distended - leave NG tube out Clear liquids until evidence of some bowel function  Attempted to D/C restraints yesterday, but he became more confused and tried to crawl out of bed.  Continue soft restraints for patient safety   LOS: 4 days    Jason Stephenson 10/13/2021

## 2021-10-13 NOTE — Progress Notes (Signed)
[facility]  PROGRESS NOTE  Jason Stephenson OJJ:[id number] DOB: Sep 26, 1945 DOA: 10/07/2021 PCP: Ginger Organ., MD   Brief HPI:   76 year old male with medical history of dementia presents to ED with complaints of abdominal pain.  He was seen in urgent care on 09/28/2021 for 2 days of abdominal pain, nausea and vomiting.  He was suspected to have viral gastroenteritis at that time and was back to his usual state by 10/01/2021.  He had brief abdominal discomfort few days later which was self-limited but then developed severe pain while eating chicken and rice for dinner nighttime.   In the ED he was found to be afebrile with normal heart rate, normal blood pressure.  Creatinine was 1.38, similar to 2019.  CT showed multiple dilated loops of small bowel with gradual transition distally concerning for partial small bowel obstruction.  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  Underwent laparoscopic small bowel resection.    Subjective   Patient seen and examined, started on clear liquid diet.  Tolerating diet.  No nausea or vomiting.  Path report is still pending.   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, underwent laparoscopic small bowel resection -NG tube was placed; patient pulled out NG tube twice -Started on clear liquid diet per surgery -Path report pending   CKD stage III a -Creatinine at baseline  Dementia -Stable -No behavior disturbance -Soft restraints bilaterally as needed to prevent pulling out IVs  Elevated blood pressure -Patient has no history of hypertension -Not on  medications at home -Started on  IV hydralazine as needed  Medications     donepezil  10 mg Oral QHS   enoxaparin (LOVENOX) injection  40 mg Subcutaneous Daily   finasteride  5 mg Oral Daily   memantine  10 mg Oral BID     Data Reviewed:   CBG:  No results for input(s): GLUCAP in the last 168 hours.  SpO2: 96 % O2 Flow Rate (L/min): 7 L/min    Vitals:   10/12/21 0642 10/12/21 1320 10/12/21 2125 10/13/21 0655  BP: (!) 158/75 (!) 143/94 (!) 154/76 (!) 147/76  Pulse: 60 (!) 55 (!) 57 (!) 49  Resp: '14 16 18 18  '$ Temp: 98.2 F (36.8 C) (!) 97.5 F (36.4 C) 98 F (36.7 C) 97.8 F (36.6 C)  TempSrc: Oral Oral Oral Oral  SpO2: 96% 97% 92% 96%  Weight:      Height:          Data Reviewed:  Basic Metabolic Panel: Recent Labs  Lab 10/07/21 2336 10/08/21 0535 10/09/21 0323 10/10/21 0331 10/11/21 0409 10/12/21 0405  NA 138 139 136 137  --  139  K 3.6 4.2 4.1 4.0  --  3.5  CL 105 104 103 103  --  103  CO2 '26 27 25 25  '$ --  27  GLUCOSE 106* 123* 117* 101*  --  88  BUN '20 20 19 20  '$ --  23  CREATININE 1.38* 1.39* 1.38* 1.28*  --  1.29*  CALCIUM 8.5* 8.6* 8.4* 8.3*  --  8.2*  MG  --  2.1  --   --  2.3  --     CBC: Recent Labs  Lab 10/07/21 2336 10/08/21 0535 10/09/21 0323 10/10/21 0331 10/11/21 0409 10/12/21 0405  WBC 9.7 17.1* 16.1* 12.4* 19.4* 12.9*  NEUTROABS 8.2*  --   --   --   --   --   HGB 15.9 14.4 13.5 14.1 14.2 13.5  HCT 45.6 40.9 40.4 40.2 41.3 40.1  MCV 91.8 91.7 94.2 92.8 91.6 92.8  PLT 400 368 331 344 436* 469*    LFT Recent Labs  Lab 10/07/21 2336 10/12/21 0405  AST 21 19  ALT 23 15  ALKPHOS 109 81  BILITOT 0.7 0.6  PROT 6.7 6.2*  ALBUMIN 3.3* 2.8*     Antibiotics: Anti-infectives (From admission, onward)    Start     Dose/Rate Route Frequency Ordered Stop   10/10/21 0941  sodium chloride 0.9 % with cefoTEtan (CEFOTAN) ADS Med       Note to Pharmacy: Karsten Ro: cabinet override      10/10/21 0941 10/10/21 1209    10/10/21 0900  cefoTEtan (CEFOTAN) 2 g in sodium chloride 0.9 % 100 mL IVPB        2 g 200 mL/hr over 30 Minutes Intravenous On call to O.R. 10/10/21 0806 10/10/21 1225        DVT prophylaxis: Lovenox  Code Status: Full code  Family Communication: Discussed with wife at bedside   CONSULTS    Objective    Physical Examination:  General-appears in no acute distress Heart-S1-S2, regular, no murmur auscultated Lungs-clear to auscultation bilaterally, no wheezing or crackles auscultated Abdomen-soft, nontender, no organomegaly Extremities-no edema in the lower extremities Neuro-alert, oriented x 2 , no focal deficit noted   Status is: Inpatient: Small bowel obstruction           [place]   [facility] If 7PM-7AM, please contact night-coverage at [email or web address], Office  [phone]   10/13/2021, 11:08 AM  LOS: 4 days

## 2021-10-14 DIAGNOSIS — K56609 Unspecified intestinal obstruction, unspecified as to partial versus complete obstruction: Secondary | ICD-10-CM | POA: Diagnosis not present

## 2021-10-14 DIAGNOSIS — C179 Malignant neoplasm of small intestine, unspecified: Secondary | ICD-10-CM | POA: Diagnosis not present

## 2021-10-14 DIAGNOSIS — K5700 Diverticulitis of small intestine with perforation and abscess without bleeding: Secondary | ICD-10-CM | POA: Diagnosis not present

## 2021-10-14 LAB — COMPREHENSIVE METABOLIC PANEL
ALT: 22 U/L (ref 0–44)
AST: 26 U/L (ref 15–41)
Albumin: 2.6 g/dL — ABNORMAL LOW (ref 3.5–5.0)
Alkaline Phosphatase: 110 U/L (ref 38–126)
Anion gap: 8 (ref 5–15)
BUN: 19 mg/dL (ref 8–23)
CO2: 29 mmol/L (ref 22–32)
Calcium: 8.4 mg/dL — ABNORMAL LOW (ref 8.9–10.3)
Chloride: 104 mmol/L (ref 98–111)
Creatinine, Ser: 1.26 mg/dL — ABNORMAL HIGH (ref 0.61–1.24)
GFR, Estimated: 59 mL/min — ABNORMAL LOW (ref >60)
Glucose, Bld: 102 mg/dL — ABNORMAL HIGH (ref 70–99)
Potassium: 4 mmol/L (ref 3.5–5.1)
Sodium: 141 mmol/L (ref 135–145)
Total Bilirubin: 0.7 mg/dL (ref 0.3–1.2)
Total Protein: 6.2 g/dL — ABNORMAL LOW (ref 6.5–8.1)

## 2021-10-14 LAB — CBC
HCT: 40.6 % (ref 39.0–52.0)
Hemoglobin: 13.9 g/dL (ref 13.0–17.0)
MCH: 31.4 pg (ref 26.0–34.0)
MCHC: 34.2 g/dL (ref 30.0–36.0)
MCV: 91.9 fL (ref 80.0–100.0)
Platelets: 352 10*3/uL (ref 150–400)
RBC: 4.42 MIL/uL (ref 4.22–5.81)
RDW: 11.9 % (ref 11.5–15.5)
WBC: 9.4 10*3/uL (ref 4.0–10.5)
nRBC: 0 % (ref 0.0–0.2)

## 2021-10-14 LAB — SURGICAL PATHOLOGY

## 2021-10-14 MED ORDER — HYDRALAZINE HCL 20 MG/ML IJ SOLN: 10.0000 mg | Freq: Four times a day (QID) | INTRAMUSCULAR | Status: DC | PRN

## 2021-10-14 MED ORDER — ACETAMINOPHEN 325 MG PO TABS: 650.0000 mg | ORAL_TABLET | Freq: Four times a day (QID) | ORAL | Status: DC | PRN

## 2021-10-14 NOTE — Progress Notes (Signed)
Patient trying to get out of bed, pulling condom catheter, and not able to be redirected. Patient also trying to hit staff, MD notified and soft wrist restraints reordered.

## 2021-10-14 NOTE — Progress Notes (Addendum)
PROGRESS NOTE   Jason Stephenson  MHD:[id number]    DOB: [birth year]    DOA: 10/07/2021  PCP: Ginger Organ., MD   I have briefly reviewed patients previous medical records in [facility].  Chief Complaint  Patient presents with   Abdominal Pain    Brief Narrative:  76 year old married male, independent, medical history significant for dementia, BPH, presented to the ED on 10/08/2021 with complaints of abdominal pain.  He had been seen at urgent care on 09/28/2021 with 2 days history of abdominal pain, nausea and vomiting.  He was suspected to have a viral gastroenteritis at that time and seemed to be back in his usual state by 10/01/2021.  He developed some brief abdominal discomfort a couple days later that was self-limited but then on night prior to admission, developed severe pain following meals.  He was admitted for SBO.  General surgery was consulted and underwent laparoscopic small bowel resection 10/10/2021 for SBO due to jejunal mass and perforated jejunal diverticulitis with phlegmon.  CCS slowly advancing diet, up to full liquids 5/30 and surgical pathology still pending.   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 n.p.o., IV fluids and serial KUB follow-ups. General surgery consulted and assisting with care. S/p laparoscopic small bowel resection 10/10/2021 Tolerating clear liquids, per patient and family-diet being advanced to full liquids today. Continue aggressive mobilization and incentive spirometry use.  Minimize opioids. Pathology  results back and as noted below.  FINAL MICROSCOPIC DIAGNOSIS:   A. JEJUNUM, RESECTION:  Mucosal necrosis with transmural acute inflammation and impacted fecal  material consistent with obstruction  Serosal adhesions with acute serositis  Proximal and distal margins viable  Two benign lymph nodes   Stage IIIa CKD: Stable creatinine in the 1.2 range.  Dementia with behavioral disturbance: Continue PTA Aricept, Namenda.  Appears to have not been taking Depakote and probably should be back on it. Required soft restraints, now appear off. Delirium precautions.  Elevated blood pressures No formal diagnosis of hypertension and not on antihypertensives PTA. Blood pressures labile, mildly uncontrolled at times. Continue as needed IV hydralazine.  Body mass index is 24.67 kg/m.   DVT prophylaxis: enoxaparin (LOVENOX) injection 40 mg Start: 10/09/21 1230 Place and maintain sequential compression device Start: 10/08/21 0424     Code Status: Full Code:  Family Communication: Spouse and brother (hospitalist in [place], [place]) at bedside. Disposition:  Status is: Inpatient Recent surgery, ongoing diet advancement by general surgery.  DC home pending clearance by general surgery.     Consultants:   General surgery  Procedures:   S/p laparoscopic small bowel resection 10/10/2021  Antimicrobials:   None   Subjective:  Spouse and brother by bedside.  Limited history from patient.  Denies pain.  Tolerating clear liquid diet.  Per family, diet has been advanced to full liquids by general surgery.  Per RN, patient has ambulated a couple times without concerns.  Spouse wants him to get up and walk more.  Objective:   Vitals:   10/13/21 1318 10/13/21 2118 10/13/21 2137 10/14/21 0519  BP: (!) 144/77 (!) 164/77  Marland Kitchen)  160/79  Pulse: (!) 52 (!) 47 (!) 52 (!) 49  Resp: '15 18  18  '$ Temp: 98.1 F (36.7 C) 98.5 F (36.9 C)  (!) 97.5 F (36.4 C)  TempSrc: Oral Oral  Oral  SpO2: 96% 95%   98%  Weight:      Height:        General exam: Elderly male, moderately built and nourished lying comfortably propped up in bed without distress.  Oral mucosa moist. Respiratory system: Clear to auscultation. Respiratory effort normal. Cardiovascular system: S1 & S2 heard, RRR. No JVD, murmurs, rubs, gallops or clicks. No pedal edema. Gastrointestinal system: Abdomen is nondistended, soft and nontender.  Midline surgical site honeycomb dressing intact without acute findings.  No organomegaly or masses felt. Normal bowel sounds heard. Central nervous system: Alert and oriented. No focal neurological deficits. Extremities: Symmetric 5 x 5 power. Skin: No rashes, lesions or ulcers Psychiatry: Judgement and insight appear somewhat impaired.  Mood & affect appropriate.     Data Reviewed:   I have personally reviewed following labs and imaging studies   CBC: Recent Labs  Lab 10/07/21 2336 10/08/21 0535 10/11/21 0409 10/12/21 0405 10/14/21 0407  WBC 9.7   < > 19.4* 12.9* 9.4  NEUTROABS 8.2*  --   --   --   --   HGB 15.9   < > 14.2 13.5 13.9  HCT 45.6   < > 41.3 40.1 40.6  MCV 91.8   < > 91.6 92.8 91.9  PLT 400   < > 436* 469* 352   < > = values in this interval not displayed.    Basic Metabolic Panel: Recent Labs  Lab 10/08/21 0535 10/09/21 0323 10/10/21 0331 10/11/21 0409 10/12/21 0405 10/14/21 0407  NA 139 136 137  --  139 141  K 4.2 4.1 4.0  --  3.5 4.0  CL 104 103 103  --  103 104  CO2 '27 25 25  '$ --  27 29  GLUCOSE 123* 117* 101*  --  88 102*  BUN '20 19 20  '$ --  23 19  CREATININE 1.39* 1.38* 1.28*  --  1.29* 1.26*  CALCIUM 8.6* 8.4* 8.3*  --  8.2* 8.4*  MG 2.1  --   --  2.3  --   --     Liver Function Tests: Recent Labs  Lab 10/07/21 2336 10/12/21 0405 10/14/21 0407  AST '21 19 26  '$ ALT '23 15 22  '$ ALKPHOS 109 81 110  BILITOT 0.7 0.6 0.7  PROT 6.7 6.2* 6.2*  ALBUMIN 3.3* 2.8* 2.6*    CBG: No results for input(s): GLUCAP in the last 168  hours.  Microbiology Studies:   Recent Results (from the past 240 hour(s))  Blood Culture (routine x 2)     Status: None   Collection Time: 10/07/21 11:36 PM   Specimen: BLOOD  Result Value Ref Range Status   Specimen Description   Final    BLOOD RIGHT ANTECUBITAL Performed at [facility] [address] [place], [place] [address]    Special Requests   Final    BOTTLES DRAWN AEROBIC AND ANAEROBIC Blood Culture results may not be optimal due to an excessive volume of blood received in culture bottles Performed at [facility] [address], [place], [place] [address]    Culture   Final    NO GROWTH 5 DAYS Performed at [facility], [place] [address], [place], [place] [address]  Report Status 10/13/2021 FINAL  Final  Blood Culture (routine x 2)     Status: None   Collection Time: 10/07/21 11:41 PM   Specimen: BLOOD  Result Value Ref Range Status   Specimen Description   Final    BLOOD BLOOD RIGHT WRIST Performed at [facility] [address] [place], [place] [address]    Special Requests   Final    BOTTLES DRAWN AEROBIC AND ANAEROBIC Blood Culture adequate volume Performed at [facility] [address], [place], [place] [address]    Culture   Final    NO GROWTH 5 DAYS Performed at [facility], [place] [address] [place], [place] [address]    Report Status 10/13/2021 FINAL  Final  Urine Culture     Status: None   Collection Time: 10/08/21  4:45 AM   Specimen: In/Out Cath Urine  Result Value Ref Range Status   Specimen Description   Final    IN/OUT CATH URINE Performed at [facility] [address], [place], [place] [address]    Special Requests   Final    NONE Performed at [facility], [place] [address], [place], [place] [address]    Culture   Final    NO GROWTH Performed at [facility], [place] [address] [place],   [address]    Report Status 10/09/2021 FINAL  Final    Radiology Studies:  No results found.  Scheduled Meds:    donepezil  10 mg Oral QHS   enoxaparin (LOVENOX) injection  40 mg Subcutaneous Daily   finasteride  5 mg Oral Daily   memantine  10 mg Oral BID    Continuous Infusions:     LOS: 5 days     Vernell Leep, MD,  FACP, [facility], [facility], [facility] (Care Management Physician Certified) [place]  To contact the attending provider between 7A-7P or the covering provider during after hours 7P-7A, please log into the web site [email or web address] and access using universal [place] password for that web site. If you do not have the password, please call the hospital operator.  10/14/2021, 10:47 AM

## 2021-10-14 NOTE — Progress Notes (Signed)
4 Days Post-Op  Subjective: CC: Wife and brother at bedside Out of wrist restraints He reports no abdominal pain currently.  Tolerating clear liquids without nausea or vomiting.  He is unsure of any flatus. Wife was with him yesterday and reports she was not aware of any flatus.  He was tolerating CLD well without reported nausea.  No emesis recorded.  No BM recorded. Mobilizing in the halls.  Objective: Vital signs in last 24 hours: Temp:  [97.5 F (36.4 C)-98.5 F (36.9 C)] 97.5 F (36.4 C) (05/30 0519) Pulse Rate:  [47-52] 49 (05/30 0519) Resp:  [15-18] 18 (05/30 0519) BP: (144-164)/(77-79) 160/79 (05/30 0519) SpO2:  [95 %-98 %] 98 % (05/30 0519) Last BM Date : 10/07/21  Intake/Output from previous day: 05/29 0701 - 05/30 0700 In: 1180 [P.O.:1180] Out: 900 [Urine:900] Intake/Output this shift: No intake/output data recorded.  PE: Gen:  Alert, NAD, pleasant Pulm: Rate and effort normal Abd: Soft, mild distension, +BS, NT, laparoscopic incisions with Tegaderm in place - c/d/I. Honeycomb dressing over midline incision - c/d/i  Lab Results:  Recent Labs    10/12/21 0405 10/14/21 0407  WBC 12.9* 9.4  HGB 13.5 13.9  HCT 40.1 40.6  PLT 469* 352   BMET Recent Labs    10/12/21 0405 10/14/21 0407  NA 139 141  K 3.5 4.0  CL 103 104  CO2 27 29  GLUCOSE 88 102*  BUN 23 19  CREATININE 1.29* 1.26*  CALCIUM 8.2* 8.4*   PT/INR No results for input(s): LABPROT, INR in the last 72 hours. CMP     Component Value Date/Time   NA 141 10/14/2021 0407   K 4.0 10/14/2021 0407   CL 104 10/14/2021 0407   CO2 29 10/14/2021 0407   GLUCOSE 102 (H) 10/14/2021 0407   BUN 19 10/14/2021 0407   CREATININE 1.26 (H) 10/14/2021 0407   CALCIUM 8.4 (L) 10/14/2021 0407   PROT 6.2 (L) 10/14/2021 0407   ALBUMIN 2.6 (L) 10/14/2021 0407   AST 26 10/14/2021 0407   ALT 22 10/14/2021 0407   ALKPHOS 110 10/14/2021 0407   BILITOT 0.7 10/14/2021 0407   GFRNONAA 59 (L) 10/14/2021  0407   Lipase     Component Value Date/Time   LIPASE 31 10/07/2021 2336    Studies/Results: No results found.  Anti-infectives: Anti-infectives (From admission, onward)    Start     Dose/Rate Route Frequency Ordered Stop   10/10/21 0941  sodium chloride 0.9 % with cefoTEtan (CEFOTAN) ADS Med       Note to Pharmacy: Karsten Ro: cabinet override      10/10/21 0941 10/10/21 1209   10/10/21 0900  cefoTEtan (CEFOTAN) 2 g in sodium chloride 0.9 % 100 mL IVPB        2 g 200 mL/hr over 30 Minutes Intravenous On call to O.R. 10/10/21 0806 10/10/21 1225        Assessment/Plan POD 4 s/p Laparoscopic LOA, SBR for jejunal mass and perforated jejunal diverticulitis with phlegmon - Path without evidence of malignancy. Discussed w/ family - Adv to [place]. AROBF before advancing further - Moiblilze - Pulm toilet  FEN - FLD, IVF per TRH VTE - SCDs, Lovenox ID - Cefotetan periop. WBC wnl. Afebrile. Discussed w/ MD. No need for abx at this time.  Foley - Condom cath  Dementia CKD 3    LOS: 5 days    Jason Stephenson , [facility] 10/14/2021, 8:54 AM Please see Amion for  pager number during day hours 7:00am-4:30pm

## 2021-10-15 DIAGNOSIS — F05 Delirium due to known physiological condition: Secondary | ICD-10-CM | POA: Diagnosis not present

## 2021-10-15 DIAGNOSIS — K56609 Unspecified intestinal obstruction, unspecified as to partial versus complete obstruction: Secondary | ICD-10-CM | POA: Diagnosis not present

## 2021-10-15 MED ORDER — ENSURE ENLIVE PO LIQD: 237.0000 mL | Freq: Two times a day (BID) | ORAL | Status: DC

## 2021-10-15 MED ORDER — OXYCODONE HCL 5 MG PO TABS: 2.5000 mg | ORAL_TABLET | ORAL | Status: DC | PRN

## 2021-10-15 MED ORDER — POLYETHYLENE GLYCOL 3350 17 G PO PACK
17.0000 g | PACK | Freq: Two times a day (BID) | ORAL | Status: DC
  Administered 2021-10-15 – 2021-10-16 (×3): 17 g via ORAL
  Filled 2021-10-15 (×3): qty 1

## 2021-10-15 MED ORDER — BISACODYL 10 MG RE SUPP
10.0000 mg | Freq: Once | RECTAL | Status: AC
  Administered 2021-10-15: 10 mg via RECTAL
  Filled 2021-10-15: qty 1

## 2021-10-15 NOTE — Progress Notes (Signed)
PROGRESS NOTE   Jason Stephenson  ENI:[id number]    DOB: 08/05/1945    DOA: 10/07/2021  PCP: Ginger Organ., MD   I have briefly reviewed patients previous medical records in [employer]  Chief Complaint  Patient presents with   Abdominal Pain    Brief Narrative:  76 year old married male, independent, medical history significant for dementia, BPH, presented to the ED on 10/08/2021 with complaints of abdominal pain.  He had been seen at urgent care on 09/28/2021 with 2 days history of abdominal pain, nausea and vomiting.  He was suspected to have a viral gastroenteritis at that time and seemed to be back in his usual state by 10/01/2021.  He developed some brief abdominal discomfort a couple days later that was self-limited but then on night prior to admission, developed severe pain following meals.  He was admitted for SBO.  General surgery was consulted and underwent laparoscopic small bowel resection 10/10/2021 for SBO due to jejunal mass and perforated jejunal diverticulitis with phlegmon.  CCS slowly advancing diet, and surgical pathology without malignancy.   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 n.p.o., IV fluids and serial KUB follow-ups. General surgery consulted and assisting with care. S/p laparoscopic small bowel resection 10/10/2021 Diet being gradually advanced, on full liquid diets now until return of bowel function.  Surgical team trying suppositories today. Continue aggressive mobilization and incentive spirometry use.  Minimizing  opioids. Pathology results back and as noted below.  FINAL MICROSCOPIC DIAGNOSIS:   A. JEJUNUM, RESECTION:  Mucosal necrosis with transmural acute inflammation and impacted fecal  material consistent with obstruction  Serosal adhesions with acute serositis  Proximal and distal margins viable  Two benign lymph nodes   Stage IIIa CKD: Stable creatinine in the 1.2 range.  Dementia with behavioral disturbance/delirium: Continue PTA Aricept, Namenda.  Appears to have not been taking Depakote and probably should be back on it. Delirium precautions. Hospital delirium likely precipitated by multiple factors, pain, opioid pain meds, change in environment, disturbance of sleep cycle etc.  He is calm and cooperative and his spouse is around but when she is not, becomes impulsive, attempts to get out of bed, and tends to hit out at nurses and has required bilateral wrist soft restraints. Discussed with spouse, unable to stay with him overnight because she is getting her house ready for extended family supposed to visit the end of the week. As per RN recommendations, PT consulted but it would probably be in patient's best interest to return home with his wife supervising and assisting 24/7 rather than SNF which would also contribute to his delirium.  Elevated blood pressures No formal diagnosis of hypertension and not on antihypertensives PTA. Blood pressures labile, mildly uncontrolled at times. Continue as needed IV hydralazine.  Body mass index is 24.67 kg/m.   DVT prophylaxis: enoxaparin (LOVENOX) injection 40 mg Start: 10/09/21 1230 Place and maintain sequential compression device Start: 10/08/21 0424     Code Status: Full Code:  Family Communication: Spouse at bedside. Disposition:  Status is: Inpatient Recent surgery, ongoing diet advancement by general surgery.  DC home pending clearance by general surgery, hopefully in the next 24 hours.     Consultants:   General  surgery  Procedures:   S/p laparoscopic small bowel resection 10/10/2021  Antimicrobials:   None   Subjective:  Spouse unhappy this morning because patient did not get breakfast and probably dinner last night because he has to self order from the kitchen which she is unable to do so.  Nursing have now placed an automatic meal tray order.  Spouse also asking if she can herself mobilize him more, advised that this should be done with nursing staff for PT supervision and assistance.  Patient himself was sleeping but easily aroused, denies complaints, "I am okay".  No pain reported.  Objective:   Vitals:   10/14/21 2147 10/15/21 0601 10/15/21 0601 10/15/21 1311  BP: (!) 145/78 (!) 154/78 (!) 154/78 123/73  Pulse: (!) 52 (!) 50 (!) 50 (!) 49  Resp: '18 16 16 16  '$ Temp: 98.4 F (36.9 C) 97.8 F (36.6 C) 97.8 F (36.6 C) 98.6 F (37 C)  TempSrc: Oral Oral Oral Oral  SpO2: 95% 96% 96% 98%  Weight:      Height:        General exam: Elderly male, moderately built and nourished lying comfortably propped up in bed without distress.  Oral mucosa moist. Respiratory system: Clear to auscultation. Respiratory effort normal. Cardiovascular system: S1 & S2 heard, RRR. No JVD, murmurs, rubs, gallops or clicks. No pedal edema. Gastrointestinal system: Abdomen is nondistended, soft and nontender.  Midline surgical site without acute findings and surgical team have taken off dressings.  No organomegaly or masses felt. Normal bowel sounds heard. Central nervous system: Alert and oriented to self and partly to place. No focal neurological deficits. Extremities: Symmetric 5 x 5 power.  Still had his bilateral soft wrist restraints on.  RN getting ready to remove them. Skin: No rashes, lesions or ulcers Psychiatry: Judgement and insight impaired.  Mood & affect appropriate.     Data Reviewed:   I have personally reviewed following labs and imaging studies   CBC: Recent Labs  Lab 10/11/21 0409  10/12/21 0405 10/14/21 0407  WBC 19.4* 12.9* 9.4  HGB 14.2 13.5 13.9  HCT 41.3 40.1 40.6  MCV 91.6 92.8 91.9  PLT 436* 469* 662    Basic Metabolic Panel: Recent Labs  Lab 10/09/21 0323 10/10/21 0331 10/11/21 0409 10/12/21 0405 10/14/21 0407  NA 136 137  --  139 141  K 4.1 4.0  --  3.5 4.0  CL 103 103  --  103 104  CO2 25 25  --  27 29  GLUCOSE 117* 101*  --  88 102*  BUN 19 20  --  23 19  CREATININE 1.38* 1.28*  --  1.29* 1.26*  CALCIUM 8.4* 8.3*  --  8.2* 8.4*  MG  --   --  2.3  --   --     Liver Function Tests: Recent Labs  Lab 10/12/21 0405 10/14/21 0407  AST 19 26  ALT 15 22  ALKPHOS 81 110  BILITOT 0.6 0.7  PROT 6.2* 6.2*  ALBUMIN 2.8* 2.6*    CBG: No results for input(s): GLUCAP in the last 168 hours.  Microbiology Studies:   Recent Results (from the past 240 hour(s))  Blood Culture (routine x 2)     Status: None   Collection Time: 10/07/21 11:36 PM   Specimen: BLOOD  Result Value Ref Range Status   Specimen  Description   Final    BLOOD RIGHT ANTECUBITAL Performed at [facility] [address], [place], [place] [address]    Special Requests   Final    BOTTLES DRAWN AEROBIC AND ANAEROBIC Blood Culture results may not be optimal due to an excessive volume of blood received in culture bottles Performed at [facility] [address], [place], [place] [address]    Culture   Final    NO GROWTH 5 DAYS Performed at [facility], [place] [address], [place], [place] [address]    Report Status 10/13/2021 FINAL  Final  Blood Culture (routine x 2)     Status: None   Collection Time: 10/07/21 11:41 PM   Specimen: BLOOD  Result Value Ref Range Status   Specimen Description   Final    BLOOD BLOOD RIGHT WRIST Performed at [facility] [address], [place], [place] [address]    Special Requests   Final    BOTTLES DRAWN AEROBIC AND ANAEROBIC Blood Culture adequate volume Performed at  [facility] [address], [place], [place] [address]    Culture   Final    NO GROWTH 5 DAYS Performed at [facility], [place] [address] [place], [place] [address]    Report Status 10/13/2021 FINAL  Final  Urine Culture     Status: None   Collection Time: 10/08/21  4:45 AM   Specimen: In/Out Cath Urine  Result Value Ref Range Status   Specimen Description   Final    IN/OUT CATH URINE Performed at [facility] [address] [place], [place] [address]    Special Requests   Final    NONE Performed at [facility], [place] [address] [place], [place] [address]    Culture   Final    NO GROWTH Performed at [facility], [place] [address] [place], [place] [address]    Report Status 10/09/2021 FINAL  Final    Radiology Studies:  No results found.  Scheduled Meds:    bisacodyl  10 mg Rectal Once   donepezil  10 mg Oral QHS   enoxaparin (LOVENOX) injection  40 mg Subcutaneous Daily   feeding supplement  237 mL Oral BID BM   finasteride  5 mg Oral Daily   memantine  10 mg Oral BID   polyethylene glycol  17 g Oral BID    Continuous Infusions:     LOS: 6 days     Vernell Leep, MD,  FACP, [facility], [facility], [facility] (Care Management Physician Certified) [place]  To contact the attending provider between 7A-7P or the covering provider during after hours 7P-7A, please log into the web site [email or web address] and access using universal [place] password for that web site. If you do not have the password, please call the hospital operator.  10/15/2021, 1:15 PM

## 2021-10-15 NOTE — Progress Notes (Signed)
This Actuary rounded on the patient and wife. Information will be provided to the wife to the disseminated as needed to friends and family to avoid confusion and streamline information.

## 2021-10-15 NOTE — Progress Notes (Addendum)
5 Days Post-Op  Subjective: CC: Wife at bedside.  Patient reports no abdominal pain, n/v yesterday or today. Unsure of flatus. Wife reports she does not think he passed any flatus. No bm yesterday. Tolerating FLD but not eating a lot per wife. 1331m documented on PO intake from the last 24 hours.  RN note reviewed - patient had to have soft wrist restraints reapplied yesterday.   Objective: Vital signs in last 24 hours: Temp:  [97.8 F (36.6 C)-98.4 F (36.9 C)] 97.8 F (36.6 C) (05/31 0601) Pulse Rate:  [48-52] 50 (05/31 0601) Resp:  [16-18] 16 (05/31 0601) BP: (145-154)/(78-82) 154/78 (05/31 0601) SpO2:  [95 %-98 %] 96 % (05/31 0601) Last BM Date : 10/07/21  Intake/Output from previous day: 05/30 0701 - 05/31 0700 In: 1320 [P.O.:1320] Out: 2850 [Urine:2850] Intake/Output this shift: No intake/output data recorded.  PE: Gen:  Alert, NAD, pleasant Pulm: Rate and effort normal Abd: Soft, stable mild distension, +BS, NT, laparoscopic and midline incisions with dermabond in place, c/d/I  Lab Results:  Recent Labs    10/14/21 0407  WBC 9.4  HGB 13.9  HCT 40.6  PLT 352   BMET Recent Labs    10/14/21 0407  NA 141  K 4.0  CL 104  CO2 29  GLUCOSE 102*  BUN 19  CREATININE 1.26*  CALCIUM 8.4*   PT/INR No results for input(s): LABPROT, INR in the last 72 hours. CMP     Component Value Date/Time   NA 141 10/14/2021 0407   K 4.0 10/14/2021 0407   CL 104 10/14/2021 0407   CO2 29 10/14/2021 0407   GLUCOSE 102 (H) 10/14/2021 0407   BUN 19 10/14/2021 0407   CREATININE 1.26 (H) 10/14/2021 0407   CALCIUM 8.4 (L) 10/14/2021 0407   PROT 6.2 (L) 10/14/2021 0407   ALBUMIN 2.6 (L) 10/14/2021 0407   AST 26 10/14/2021 0407   ALT 22 10/14/2021 0407   ALKPHOS 110 10/14/2021 0407   BILITOT 0.7 10/14/2021 0407   GFRNONAA 59 (L) 10/14/2021 0407   Lipase     Component Value Date/Time   LIPASE 31 10/07/2021 2336    Studies/Results: No results  found.  Anti-infectives: Anti-infectives (From admission, onward)    Start     Dose/Rate Route Frequency Ordered Stop   10/10/21 0941  sodium chloride 0.9 % with cefoTEtan (CEFOTAN) ADS Med       Note to Pharmacy: SKarsten Ro cabinet override      10/10/21 0941 10/10/21 1209   10/10/21 0900  cefoTEtan (CEFOTAN) 2 g in sodium chloride 0.9 % 100 mL IVPB        2 g 200 mL/hr over 30 Minutes Intravenous On call to O.R. 10/10/21 0806 10/10/21 1225        Assessment/Plan POD 5 s/p Laparoscopic LOA, SBR for jejunal mass and perforated jejunal diverticulitis with phlegmon - Path without evidence of malignancy. Discussed w/ family - Keep on FLD till ROBF - Suppository today - Moiblilze - Pulm toilet   FEN - FLD, Miralax, Suppository, add ensure, IVF per TRH VTE - SCDs, Lovenox ID - Cefotetan periop. WBC wnl. Afebrile. Discussed w/ MD. No need for abx at this time.  Foley - Condom cath   Dementia - Delirium precautions, working on getting back out of soft wrist restraints.  CKD 3   LOS: 6 days    MJillyn Ledger, [facility] 10/15/2021, 8:10 AM Please see Amion for pager number during day  hours 7:00am-4:30pm

## 2021-10-15 NOTE — Care Management Important Message (Signed)
Important Message  Patient Details IM Letter placed in Patients room for Wife. Name: ULES MARSALA MRN: [id number] Date of Birth: [birth year]   Medicare Important Message Given:  Yes     Kerin Salen 10/15/2021, 2:01 PM

## 2021-10-16 DIAGNOSIS — K56609 Unspecified intestinal obstruction, unspecified as to partial versus complete obstruction: Secondary | ICD-10-CM | POA: Diagnosis not present

## 2021-10-16 LAB — CBC
HCT: 43.3 % (ref 39.0–52.0)
Hemoglobin: 14.9 g/dL (ref 13.0–17.0)
MCH: 31.3 pg (ref 26.0–34.0)
MCHC: 34.4 g/dL (ref 30.0–36.0)
MCV: 91 fL (ref 80.0–100.0)
Platelets: 569 10*3/uL — ABNORMAL HIGH (ref 150–400)
RBC: 4.76 MIL/uL (ref 4.22–5.81)
RDW: 11.9 % (ref 11.5–15.5)
WBC: 8.3 10*3/uL (ref 4.0–10.5)
nRBC: 0 % (ref 0.0–0.2)

## 2021-10-16 LAB — BASIC METABOLIC PANEL
Anion gap: 7 (ref 5–15)
BUN: 19 mg/dL (ref 8–23)
CO2: 27 mmol/L (ref 22–32)
Calcium: 8.7 mg/dL — ABNORMAL LOW (ref 8.9–10.3)
Chloride: 106 mmol/L (ref 98–111)
Creatinine, Ser: 1.29 mg/dL — ABNORMAL HIGH (ref 0.61–1.24)
GFR, Estimated: 58 mL/min — ABNORMAL LOW (ref >60)
Glucose, Bld: 104 mg/dL — ABNORMAL HIGH (ref 70–99)
Potassium: 3.6 mmol/L (ref 3.5–5.1)
Sodium: 140 mmol/L (ref 135–145)

## 2021-10-16 MED ORDER — ACETAMINOPHEN 325 MG PO TABS: 650.0000 mg | ORAL_TABLET | Freq: Four times a day (QID) | ORAL | Status: DC | PRN

## 2021-10-16 MED ORDER — POLYETHYLENE GLYCOL 3350 17 G PO PACK: 17.0000 g | PACK | Freq: Two times a day (BID) | ORAL | 0 refills | Status: DC

## 2021-10-16 NOTE — Discharge Summary (Signed)
Physician Discharge Summary  Jason Stephenson BUL:[id number] DOB: 1946/03/27  PCP: Ginger Organ., MD  Admitted from: Home Discharged to: Home  Admit date: 10/07/2021 Discharge date: 10/16/2021  Recommendations for Outpatient Follow-up:    Follow-up Information     Michael Boston, MD. Call.   Specialties: General Surgery, Colon and Rectal Surgery Why: Please call to confirm your appointment date and time.  We are working hard to make this for you.  Please arrive 30 minutes prior to your appointment for paperwork.  Please bring a copy of your photo ID and insurance card. Contact information: [address] [place] [address] [phone]         Ginger Organ., MD. Schedule an appointment as soon as possible for a visit in 1 week(s).   Specialty: Internal Medicine Why: To be seen with repeat labs (CBC & BMP). Contact information: [place] [address] [place]: None    Equipment/Devices: None    Discharge Condition: Improved and stable.   Code Status: Full Code Diet recommendation:  Discharge Diet Orders (From admission, onward)     Start     Ordered   10/16/21 0000  Diet - low sodium heart healthy       Comments: Soft food for several days and then can gradually advance to regular consistency diet.   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  He had been seen at urgent care on 09/28/2021  with 2 days history of abdominal pain, nausea and vomiting.  He was suspected to have a viral gastroenteritis at that time and seemed to be back in his usual state by 10/01/2021.  He developed some brief abdominal discomfort a couple days later that was self-limited but then on night prior to admission, developed severe pain following meals.  He was admitted for SBO.  General surgery was consulted and underwent laparoscopic small bowel resection 10/10/2021 for SBO due to jejunal mass and perforated jejunal diverticulitis with phlegmon.  CCS slowly advanced diet, and surgical pathology without malignancy.     Assessment & Plan:    Small bowel obstruction secondary to jejunal mass and perforated jejunal diverticulitis with phlegmon: Failed conservative measures i.e. n.p.o., IV fluids and serial KUB follow-ups. General surgery consulted and assisting with care. S/p laparoscopic small bowel resection 10/10/2021 Diet was gradually advanced over a couple of days.  Dulcolax suppositories were added. Clinically improved.  Tolerating diet.  Having BMs.  Discussed with surgical team who have seen him in their follow-up appreciated.  They have cleared him for discharge and will arrange outpatient follow-up. Patient has not required any pain meds in the last 24 hours. Pathology results back and as noted below.  FINAL MICROSCOPIC DIAGNOSIS:   A. JEJUNUM, RESECTION:  Mucosal necrosis with transmural acute inflammation and impacted fecal  material consistent with obstruction  Serosal adhesions with  acute serositis  Proximal and distal margins viable  Two benign lymph nodes    Stage IIIa CKD: Stable creatinine in the 1.2 range.  Outpatient follow-up.   Dementia with behavioral disturbance/delirium: Continue PTA Aricept, Namenda.  Appears to have not been taking Depakote and probably should be back on it but will defer this decision to his PCP at follow-up. Delirium precautions. Hospital delirium likely  precipitated by multiple factors, pain, opioid pain meds, change in environment, disturbance of sleep cycle etc.  He was calm and cooperative when his spouse was around but when she was not, became impulsive, attempting to get out of bed, and tends to hit out at nurses and has required bilateral wrist soft restraints. Mental status appears to be at baseline.  He will likely do much better when he returns to his home environment. PT evaluated and seen no home needs.   Elevated blood pressures No formal diagnosis of hypertension and not on antihypertensives PTA. Blood pressures labile, mildly uncontrolled at times. Outpatient follow-up with PCP.  Microscopic hematuria: Urine microscopy 5/24 showed 21-50 RBCs per hpf.  Unclear if he had a Foley catheter at this time.  No UTI symptoms.  Recommend repeating urine microscopy in a couple of weeks and if this persist then may need further evaluation including urology consultation.   Body mass index is 24.67 kg/m.     Consultants:   General surgery   Procedures:   S/p laparoscopic small bowel resection 10/10/2021   Discharge Instructions  Discharge Instructions     Call MD for:  difficulty breathing, headache or visual disturbances   Complete by: As directed    Call MD for:  extreme fatigue   Complete by: As directed    Call MD for:  persistant dizziness or light-headedness   Complete by: As directed    Call MD for:  persistant nausea and vomiting   Complete by: As directed    Call MD for:  redness, tenderness, or signs of infection (pain, swelling, redness, odor or green/yellow discharge around incision site)   Complete by: As directed    Call MD for:  severe uncontrolled pain   Complete by: As directed    Call MD for:  temperature >100.4   Complete by: As directed    Diet - low sodium heart healthy   Complete by: As directed    Soft food for several days and then can gradually advance to regular consistency diet.   Increase  activity slowly   Complete by: As directed    No wound care   Complete by: As directed         Medication List     STOP taking these medications    divalproex 125 MG DR tablet Commonly known as: Depakote   ondansetron 8 MG disintegrating tablet Commonly known as: ZOFRAN-ODT       TAKE these medications    acetaminophen 325 MG tablet Commonly known as: TYLENOL Take 2 tablets (650 mg total) by mouth every 6 (six) hours as needed for mild pain, moderate pain or fever.   donepezil 10 MG tablet Commonly known as: ARICEPT Take 1 tablet (10 mg total) by mouth at bedtime.   finasteride 5 MG tablet Commonly known as: PROSCAR Take 5 mg by mouth daily.   memantine 10 MG tablet Commonly known as: NAMENDA Take 1 tablet  twice a day What changed:  how much to take how to take this when to take this additional instructions   polyethylene  glycol 17 g packet Commonly known as: MIRALAX / GLYCOLAX Take 17 g by mouth 2 (two) times daily.   tadalafil 5 MG tablet Commonly known as: CIALIS Take 5 mg by mouth daily.   VITAMIN D MAINTENANCE PO Take 1,000 mg by mouth.   Vitamin D3 25 MCG (1000 UT) Caps Take 1,000 Units by mouth daily.       No Known Allergies    Procedures/Studies: DG Abd 1 View  Result Date: 10/09/2021 CLINICAL DATA:  Small bowel obstruction EXAM: ABDOMEN - 1 VIEW COMPARISON:  CT abdomen pelvis, 10/08/2021 FINDINGS: Gas distended loops of small bowel in the central abdomen measuring up to 5.6 cm in caliber. Little if any colonic gas appreciated. No large burden of stool. No obvious free air on supine radiographs. No radio-opaque calculi or other significant radiographic abnormality are seen. IMPRESSION: Gas distended loops of small bowel in the central abdomen measuring up to 5.6 cm in caliber. Little if any colonic gas appreciated. Findings are consistent with small bowel obstruction. Electronically Signed   By: Delanna Ahmadi M.D.   On: 10/09/2021 10:31    CT Abdomen Pelvis W Contrast  Result Date: 10/08/2021 CLINICAL DATA:  Abdominal pain, nausea/vomiting EXAM: CT ABDOMEN AND PELVIS WITH CONTRAST TECHNIQUE: Multidetector CT imaging of the abdomen and pelvis was performed using the standard protocol following bolus administration of intravenous contrast. RADIATION DOSE REDUCTION: This exam was performed according to the departmental dose-optimization program which includes automated exposure control, adjustment of the mA and/or kV according to patient size and/or use of iterative reconstruction technique. CONTRAST:  161m OMNIPAQUE IOHEXOL 300 MG/ML  SOLN COMPARISON:  None Available. FINDINGS: Lower chest: Mild bibasilar opacities, likely atelectasis. Hepatobiliary: Liver is within normal limits. Status post cholecystectomy. No intrahepatic or extrahepatic ductal dilatation. Pancreas: Within normal limits. Spleen: Within normal limits. Adrenals/Urinary Tract: Adrenal glands are within normal limits. Subcentimeter right lower pole renal cyst (series 2/image 39). Suspected faint excretory contrast in the bilateral renal collecting systems. No hydronephrosis. Thick-walled bladder, although underdistended. Stomach/Bowel: Stomach is within normal limits. Multiple dilated loops of small bowel in the left mid abdomen. Small bowel stasis in the left mid abdomen (series 2/image 39). Associated gradual transition/narrowing distally in the left mid abdomen (series 2/image 40). Small bowel loops in the right lower quadrant are decompressed (series 2/image 54). Overall, this appearance raises concern for partial small bowel obstruction, less likely small bowel enteritis. Associated small bowel diverticulum in the left upper abdomen (series 2/image 34), with mild wall thickening, but no convincing inflammation to suggest small bowel diverticulitis. Extensive sigmoid diverticulosis, without evidence of diverticulitis. Vascular/Lymphatic: No evidence of abdominal aortic  aneurysm. Atherosclerotic calcifications of the abdominal aorta and branch vessels. No suspicious abdominopelvic lymphadenopathy. Reproductive: Prostate is unremarkable. Other: Small volume pelvic ascites. No pneumatosis or free air. Musculoskeletal: Mild degenerative changes of the lumbar spine. IMPRESSION: Multiple dilated loops of small bowel in the left mid abdomen with associated gradual transition/narrowing distally. Overall, this appearance raises concern for partial small bowel obstruction, less likely small bowel enteritis. Associated small bowel diverticulum in the left anterior abdomen, without convincing small bowel diverticulitis. Small volume pelvic ascites. No pneumatosis or free air. Electronically Signed   By: SJulian HyM.D.   On: 10/08/2021 01:02   DG Chest Port 1 View  Result Date: 10/07/2021 CLINICAL DATA:  Possible sepsis, fever, confusion. EXAM: PORTABLE CHEST 1 VIEW COMPARISON:  None Available. FINDINGS: The heart is borderline enlarged the mediastinal contour is within normal limits. Atherosclerotic  calcification of the aorta is noted. Lung volumes are low with mild atelectasis at the left lung base. No consolidation, effusion, or pneumothorax. No acute osseous abnormality. IMPRESSION: Mild atelectasis at the left lung base. Electronically Signed   By: Brett Fairy M.D.   On: 10/07/2021 23:43   DG Abd Portable 1V  Result Date: 10/11/2021 CLINICAL DATA:  Status post NG tube placement. EXAM: PORTABLE ABDOMEN - 1 VIEW COMPARISON:  Radiographs dated Oct 09, 2021 FINDINGS: NG tube with side port in the distal esophagus and distal tip just below the GE junction, it could be advanced 7-8 cm. Partially imaged abdomen demonstrate contrast fills small bowel loops. Abdomen not completely imaged. Cholecystectomy clips are noted. No acute osseous abnormality. IMPRESSION: NG tube with side port in the distal esophagus and tip just below the GE junction, it could be advanced 7-8 cm.  Electronically Signed   By: Keane Police D.O.   On: 10/11/2021 12:55   DG Abd Portable 1V-Small Bowel Protocol-Position Verification  Result Date: 10/09/2021 CLINICAL DATA:  NG tube placement EXAM: PORTABLE ABDOMEN - 1 VIEW COMPARISON:  10/09/2021, 9:10 a.m. FINDINGS: Interval placement of esophagogastric tube, tip and side port below the diaphragm. Distention of small-bowel is unchanged at approximately 5.1 cm caliber. No free air on erect radiographs. IMPRESSION: 1. Interval placement of esophagogastric tube, tip and side port below the diaphragm. 2. Distention of small-bowel is unchanged, consistent with small bowel obstruction. No free air on erect radiographs. Electronically Signed   By: Delanna Ahmadi M.D.   On: 10/09/2021 13:21   DG Abd Portable 1V-Small Bowel Obstruction Protocol-initial, 8 hr delay  Result Date: 10/09/2021 CLINICAL DATA:  8 hour delay film, NG tube placement. EXAM: PORTABLE ABDOMEN - 1 VIEW COMPARISON:  10/09/2021. FINDINGS: There is redemonstration of multiple gas-filled dilated loops of small bowel in the abdomen measuring up to 5.2 cm in diameter. Contrast is present in the colon on delayed imaging. Air is seen in the rectum. An enteric tube terminates in the stomach. No acute osseous abnormality is identified. IMPRESSION: Persistent dilatation of multiple loops of small bowel in the abdomen measuring up to 5.2 cm. Contrast is identified in the colon on delayed imaging suggesting ileus versus partial obstruction. Electronically Signed   By: Brett Fairy M.D.   On: 10/09/2021 22:43      Subjective: Seen this morning.  Spouse at bedside.  Confirmed that he had 2 BMs in the last 24 hours.  Also tolerated of full breakfast with pancakes, [place] toast, orange juice etc.  Patient himself denies complaints.  No pain reported.  As per RN, no acute issues.  Discharge Exam:  Vitals:   10/15/21 0601 10/15/21 1311 10/15/21 2112 10/16/21 0618  BP: (!) 154/78 123/73 (!) 157/86 (!)  142/80  Pulse: (!) 50 (!) 49 (!) 52 (!) 59  Resp: '16 16 14 14  '$ Temp: 97.8 F (36.6 C) 98.6 F (37 C) 97.7 F (36.5 C) 97.7 F (36.5 C)  TempSrc: Oral Oral Oral Oral  SpO2: 96% 98% 96% 95%  Weight:      Height:        General exam: Elderly male, moderately built and nourished lying comfortably propped up in bed without distress.  Oral mucosa moist. Respiratory system: Clear to auscultation. Respiratory effort normal. Cardiovascular system: S1 & S2 heard, RRR. No JVD, murmurs, rubs, gallops or clicks. No pedal edema. Gastrointestinal system: Abdomen is nondistended, soft and nontender.  Midline surgical site without acute findings.  No organomegaly  or masses felt. Normal bowel sounds heard. Central nervous system: Alert and oriented to self and partly to place. No focal neurological deficits. Extremities: Symmetric 5 x 5 power.  Skin: No rashes, lesions or ulcers Psychiatry: Judgement and insight impaired.  Mood & affect appropriate.     The results of significant diagnostics from this hospitalization (including imaging, microbiology, ancillary and laboratory) are listed below for reference.     Microbiology: Recent Results (from the past 240 hour(s))  Blood Culture (routine x 2)     Status: None   Collection Time: 10/07/21 11:36 PM   Specimen: BLOOD  Result Value Ref Range Status   Specimen Description   Final    BLOOD RIGHT ANTECUBITAL Performed at [facility] [address], [place], [place] [address]    Special Requests   Final    BOTTLES DRAWN AEROBIC AND ANAEROBIC Blood Culture results may not be optimal due to an excessive volume of blood received in culture bottles Performed at [facility] [address] [place], [place] [address]    Culture   Final    NO GROWTH 5 DAYS Performed at [facility], [place] [address] [place], [place] [address]    Report Status 10/13/2021 FINAL  Final  Blood Culture (routine x 2)      Status: None   Collection Time: 10/07/21 11:41 PM   Specimen: BLOOD  Result Value Ref Range Status   Specimen Description   Final    BLOOD BLOOD RIGHT WRIST Performed at [facility] [address] [place], [place] [address]    Special Requests   Final    BOTTLES DRAWN AEROBIC AND ANAEROBIC Blood Culture adequate volume Performed at [facility] [address], [place], [place] [address]    Culture   Final    NO GROWTH 5 DAYS Performed at [facility], [place] [address], [place], [place] [address]    Report Status 10/13/2021 FINAL  Final  Urine Culture     Status: None   Collection Time: 10/08/21  4:45 AM   Specimen: In/Out Cath Urine  Result Value Ref Range Status   Specimen Description   Final    IN/OUT CATH URINE Performed at [facility] [address], [place], [place] [address]    Special Requests   Final    NONE Performed at [facility], [place] [address] [place], [place] [address]    Culture   Final    NO GROWTH Performed at [facility], [place] [address], [place], [place] [address]    Report Status 10/09/2021 FINAL  Final     Labs: CBC: Recent Labs  Lab 10/10/21 0331 10/11/21 0409 10/12/21 0405 10/14/21 0407 10/16/21 0441  WBC 12.4* 19.4* 12.9* 9.4 8.3  HGB 14.1 14.2 13.5 13.9 14.9  HCT 40.2 41.3 40.1 40.6 43.3  MCV 92.8 91.6 92.8 91.9 91.0  PLT 344 436* 469* 352 569*    Basic Metabolic Panel: Recent Labs  Lab 10/10/21 0331 10/11/21 0409 10/12/21 0405 10/14/21 0407 10/16/21 0441  NA 137  --  139 141 140  K 4.0  --  3.5 4.0 3.6  CL 103  --  103 104 106  CO2 25  --  '27 29 27  '$ GLUCOSE 101*  --  88 102* 104*  BUN 20  --  '23 19 19  '$ CREATININE 1.28*  --  1.29* 1.26* 1.29*  CALCIUM 8.3*  --  8.2* 8.4* 8.7*  MG  --  2.3  --   --   --     Liver Function Tests: Recent Labs  Lab 10/12/21 0405 10/14/21 0407  AST 19 26  ALT 15 22  ALKPHOS 81 110  BILITOT 0.6  0.7  PROT 6.2* 6.2*  ALBUMIN 2.8* 2.6*      Urinalysis    Component Value Date/Time   COLORURINE YELLOW 10/08/2021 [place] 10/08/2021 0445   LABSPEC 1.030 10/08/2021 0445   PHURINE 6.0 10/08/2021 0445   GLUCOSEU NEGATIVE 10/08/2021 0445   HGBUR MODERATE (A) 10/08/2021 0445   BILIRUBINUR NEGATIVE 10/08/2021 0445   KETONESUR 5 (A) 10/08/2021 0445   PROTEINUR NEGATIVE 10/08/2021 0445   NITRITE NEGATIVE 10/08/2021 0445   LEUKOCYTESUR NEGATIVE 10/08/2021 0445    Discussed in detail with patient's spouse at bedside.  Time coordinating discharge: 40 minutes  SIGNED:  Vernell Leep, MD,  FACP, [facility], [facility], [facility] (Care Management Physician Certified). [facility]  To contact the attending provider between 7A-7P or the covering provider during after hours 7P-7A, please log into the web site [email or web address] and access using universal [place] password for that web site. If you do not have the password, please call the hospital operator.

## 2021-10-16 NOTE — Evaluation (Signed)
Physical Therapy Evaluation Patient Details Name: Jason Stephenson MRN: [id number] DOB: 26-Apr-1946 Today's Date: 10/16/2021  History of Present Illness  76 year old married male, independent, medical history significant for dementia, BPH, presented to the ED on 10/08/2021 with complaints of abdominal pain.Dx of SBO. s/p small bowel resection 10/10/21.  Clinical Impression  Pt is mobilizing well, he independently ambulated 400' without an assistive device, no loss of balance. He is ready to DC home from a PT standpoint. No further PT indicated.        Recommendations for follow up therapy are one component of a multi-disciplinary discharge planning process, led by the attending physician.  Recommendations may be updated based on patient status, additional functional criteria and insurance authorization.  Follow Up Recommendations No PT follow up    Assistance Recommended at Discharge None  Patient can return home with the following       Equipment Recommendations None recommended by PT  Recommendations for Other Services       Functional Status Assessment Patient has not had a recent decline in their functional status     Precautions / Restrictions Precautions Precautions: Other (comment) Precaution Comments: abdominal surgery Restrictions Weight Bearing Restrictions: No      Mobility  Bed Mobility Overal bed mobility: Independent                  Transfers Overall transfer level: Independent                      Ambulation/Gait Ambulation/Gait assistance: Independent Gait Distance (Feet): 400 Feet Assistive device: None Gait Pattern/deviations: WFL(Within Functional Limits) Gait velocity: WNL     General Gait Details: no loss of balance  Stairs            Wheelchair Mobility    Modified Rankin (Stroke Patients Only)       Balance Overall balance assessment: Independent                                            Pertinent Vitals/Pain Pain Assessment Pain Assessment: No/denies pain Breathing: normal Negative Vocalization: none Facial Expression: smiling or inexpressive Body Language: relaxed Consolability: no need to console PAINAD Score: 0    Home Living Family/patient expects to be discharged to:: Private residence Living Arrangements: Spouse/significant other Available Help at Discharge: Family;Available 24 hours/day           Home Layout: One level        Prior Function Prior Level of Function : Independent/Modified Independent             Mobility Comments: does triathalons, competes in senior games ADLs Comments: independent     Hand Dominance        Extremity/Trunk Assessment   Upper Extremity Assessment Upper Extremity Assessment: Overall WFL for tasks assessed    Lower Extremity Assessment Lower Extremity Assessment: Overall WFL for tasks assessed    Cervical / Trunk Assessment Cervical / Trunk Assessment: Normal  Communication   Communication: No difficulties  Cognition Arousal/Alertness: Awake/alert Behavior During Therapy: WFL for tasks assessed/performed Overall Cognitive Status: History of cognitive impairments - at baseline                                 General Comments: h/o dementia, pt able to follow commands and  is pleasant        General Comments      Exercises     Assessment/Plan    PT Assessment Patient does not need any further PT services  PT Problem List         PT Treatment Interventions      PT Goals (Current goals can be found in the Care Plan section)  Acute Rehab PT Goals PT Goal Formulation: All assessment and education complete, DC therapy    Frequency       Co-evaluation               AM-PAC PT "6 Clicks" Mobility  Outcome Measure Help needed turning from your back to your side while in a flat bed without using bedrails?: None Help needed moving from lying on your back to  sitting on the side of a flat bed without using bedrails?: None Help needed moving to and from a bed to a chair (including a wheelchair)?: None Help needed standing up from a chair using your arms (e.g., wheelchair or bedside chair)?: None Help needed to walk in hospital room?: None Help needed climbing 3-5 steps with a railing? : None 6 Click Score: 24    End of Session   Activity Tolerance: Patient tolerated treatment well Patient left: in chair;with call bell/phone within reach;with family/visitor present Nurse Communication: Mobility status      Time: 0349-1791 PT Time Calculation (min) (ACUTE ONLY): 11 min   Charges:   PT Evaluation $PT Eval Low Complexity: 1 Low          Philomena Doheny PT 10/16/2021  Acute Rehabilitation Services Pager [phone] Office [phone]

## 2021-10-16 NOTE — Progress Notes (Signed)
Discharge instructions discussed with patient and wife, verbalized agreement and understanding  

## 2021-10-16 NOTE — Discharge Instructions (Addendum)
[facility]  Please arrive at least 30 min before your appointment to complete your check in paperwork.  If you are unable to arrive 30 min prior to your appointment time we may have to cancel or reschedule you. LAPAROSCOPIC SURGERY: POST OP INSTRUCTIONS Always review your discharge instruction sheet given to you by the facility where your surgery was performed. IF YOU HAVE DISABILITY OR FAMILY LEAVE FORMS, YOU MUST BRING THEM TO THE OFFICE FOR PROCESSING.   DO NOT GIVE THEM TO YOUR DOCTOR.  PAIN CONTROL  First take acetaminophen (Tylenol) for your pain after surgery.  Follow directions on package.  Taking acetaminophen (Tylenol) after surgery will help to control your pain and lower the amount of prescription pain medication you may need.  You should not take more than 3,000 mg (3 grams) of acetaminophen (Tylenol) in 24 hours.  You should not take ibuprofen (Advil), aleve, motrin, naprosyn or other NSAIDS if you have a history of stomach ulcers or chronic kidney disease.  A prescription for pain medication may be given to you upon discharge.  Take your pain medication as prescribed, if you still have uncontrolled pain after taking acetaminophen (Tylenol). Use ice packs to help control pain. If you need a refill on your pain medication, please contact your pharmacy.  They will contact our office to request authorization. Prescriptions will not be filled after 5pm or on week-ends.  HOME MEDICATIONS Take your usually prescribed medications unless otherwise directed.  DIET You should follow a light diet the first few days after arrival home.  Be sure to include lots of fluids daily. Avoid fatty, fried foods.   CONSTIPATION It is common to experience some constipation after surgery and if you are taking pain medication.  Increasing fluid intake and taking a stool softener (such as Colace) will usually help or prevent this problem from occurring.  A mild laxative (Milk of  Magnesia or Miralax) should be taken according to package instructions if there are no bowel movements after 48 hours.  WOUND/INCISION CARE Most patients will experience some swelling and bruising in the area of the incisions.  Ice packs will help.  Swelling and bruising can take several days to resolve.  Unless discharge instructions indicate otherwise, follow guidelines below  STERI-STRIPS - you may remove your outer bandages 48 hours after surgery, and you may shower at that time.  You have steri-strips (small skin tapes) in place directly over the incision.  These strips should be left on the skin for 7-10 days.   DERMABOND/SKIN GLUE - you may shower in 24 hours.  The glue will flake off over the next 2-3 weeks. Any sutures or staples will be removed at the office during your follow-up visit.  ACTIVITIES You may resume regular (light) daily activities beginning the next day--such as daily self-care, walking, climbing stairs--gradually increasing activities as tolerated.  You may have sexual intercourse when it is comfortable.  Refrain from any heavy lifting or straining until approved by your doctor. You may drive when you are no longer taking prescription pain medication, you can comfortably wear a seatbelt, and you can safely maneuver your car and apply brakes.  FOLLOW-UP You should see your doctor in the office for a follow-up appointment approximately 2-3 weeks after your surgery.  You should have been given your post-op/follow-up appointment when your surgery was scheduled.  If you did not receive a post-op/follow-up appointment, make sure that you call for this appointment within a day or two  after you arrive home to insure a convenient appointment time.   WHEN TO CALL YOUR DOCTOR: Fever over 101.0 Inability to urinate Continued bleeding from incision. Increased pain, redness, or drainage from the incision. Increasing abdominal pain  The clinic staff is available to answer your  questions during regular business hours.  Please don't hesitate to call and ask to speak to one of the nurses for clinical concerns.  If you have a medical emergency, go to the nearest emergency room or call 911.  A surgeon from [facility] is always on call at the hospital. [address] [place], [place], [place]  [address] ? [address], [place], [place]   [id number] [phone] ? [phone] ? FAX [phone]   Additional Discharge Instructions   Please get your medications reviewed and adjusted by your Primary MD.  Please request your Primary MD to go over all Hospital Tests and Procedure/Radiological results at the follow up, please get all Hospital records sent to your Prim MD by signing hospital release before you go home.  If you had Pneumonia of Lung problems at the Hospital: Please get a 2 view Chest X ray done in approximately 4 weeks after hospital discharge or sooner if instructed by your Primary MD.  If you have Congestive Heart Failure: Please call your Cardiologist or Primary MD anytime you have any of the following symptoms:  1) 3 pound weight gain in 24 hours or 5 pounds in 1 week  2) shortness of breath, with or without a dry hacking cough  3) swelling in the hands, feet or stomach  4) if you have to sleep on extra pillows at night in order to breathe  Follow cardiac low salt diet and 1.5 lit/day fluid restriction.  If you have diabetes Accuchecks 4 times/day, Once in AM empty stomach and then before each meal. Log in all results and show them to your primary doctor at your next visit. If any glucose reading is under 80 or above 300 call your primary MD immediately.  If you have Seizure/Convulsions/Epilepsy: Please do not drive, operate heavy machinery, participate in activities at heights or participate in high speed sports until you have seen by Primary MD or a Neurologist and advised to do so again. Per [facility] statutes,  patients with seizures are not allowed to drive until they have been seizure-free for six months.  Use caution when using heavy equipment or power tools. Avoid working on ladders or at heights. Take showers instead of baths. Ensure the water temperature is not too high on the home water heater. Do not go swimming alone. Do not lock yourself in a room alone (i.e. bathroom). When caring for infants or small children, sit down when holding, feeding, or changing them to minimize risk of injury to the child in the event you have a seizure. Maintain good sleep hygiene. Avoid alcohol.   If you had Gastrointestinal Bleeding: Please ask your Primary MD to check a complete blood count within one week of discharge or at your next visit. Your endoscopic/colonoscopic biopsies that are pending at the time of discharge, will also need to followed by your Primary MD.  Get Medicines reviewed and adjusted. Please take all your medications with you for your next visit with your Primary MD  Please request your Primary MD to go over all hospital tests and procedure/radiological results at the follow up, please ask your Primary MD to get all Hospital records sent to his/her office.  If you experience worsening of your admission symptoms, develop shortness of breath, life threatening emergency, suicidal or homicidal thoughts you must seek medical attention immediately by calling 911 or calling your MD immediately  if symptoms less severe.  You must read complete instructions/literature along with all the possible adverse reactions/side effects for all the Medicines you take and that have been prescribed to you. Take any new Medicines after you have completely understood and accpet all the possible adverse reactions/side effects.   Do not drive or operate heavy machinery when taking Pain medications.   Do not take more than prescribed Pain, Sleep and Anxiety Medications  Special Instructions: If you have smoked or chewed  Tobacco  in the last 2 yrs please stop smoking, stop any regular Alcohol  and or any Recreational drug use.  Wear Seat belts while driving.  Please note You were cared for by a hospitalist during your hospital stay. If you have any questions about your discharge medications or the care you received while you were in the hospital after you are discharged, you can call the unit and asked to speak with the hospitalist on call if the hospitalist that took care of you is not available. Once you are discharged, your primary care physician will handle any further medical issues. Please note that NO REFILLS for any discharge medications will be authorized once you are discharged, as it is imperative that you return to your primary care physician (or establish a relationship with a primary care physician if you do not have one) for your aftercare needs so that they can reassess your need for medications and monitor your lab values.  You can reach the hospitalist office at phone [phone] or fax [phone]   If you do not have a primary care physician, you can call [phone] for a physician referral.

## 2021-10-16 NOTE — Progress Notes (Signed)
6 Days Post-Op  Subjective: CC: Patients wife at bedside.  Patient had a large bowel movement after suppository yesterday.  He had an additional large bowel movement later that evening.  Passing flatus.  No nausea or vomiting.  Denies any abdominal pain this morning.  About to try soft diet for breakfast. Mobilizing in halls per staff.   Objective: Vital signs in last 24 hours: Temp:  [97.7 F (36.5 C)-98.6 F (37 C)] 97.7 F (36.5 C) (06/01 0618) Pulse Rate:  [49-59] 59 (06/01 0618) Resp:  [14-16] 14 (06/01 0618) BP: (123-157)/(73-86) 142/80 (06/01 0618) SpO2:  [95 %-98 %] 95 % (06/01 0618) Last BM Date : 10/07/21  Intake/Output from previous day: 05/31 0701 - 06/01 0700 In: 480 [P.O.:480] Out: 200 [Urine:200] Intake/Output this shift: No intake/output data recorded.  PE: Gen:  Alert, NAD, pleasant Pulm: Rate and effort normal Abd: Soft, stable mild distension, +BS, NT, laparoscopic and midline incisions with dermabond in place, c/d/I  Lab Results:  Recent Labs    10/14/21 0407 10/16/21 0441  WBC 9.4 8.3  HGB 13.9 14.9  HCT 40.6 43.3  PLT 352 569*   BMET Recent Labs    10/14/21 0407 10/16/21 0441  NA 141 140  K 4.0 3.6  CL 104 106  CO2 29 27  GLUCOSE 102* 104*  BUN 19 19  CREATININE 1.26* 1.29*  CALCIUM 8.4* 8.7*   PT/INR No results for input(s): LABPROT, INR in the last 72 hours. CMP     Component Value Date/Time   NA 140 10/16/2021 0441   K 3.6 10/16/2021 0441   CL 106 10/16/2021 0441   CO2 27 10/16/2021 0441   GLUCOSE 104 (H) 10/16/2021 0441   BUN 19 10/16/2021 0441   CREATININE 1.29 (H) 10/16/2021 0441   CALCIUM 8.7 (L) 10/16/2021 0441   PROT 6.2 (L) 10/14/2021 0407   ALBUMIN 2.6 (L) 10/14/2021 0407   AST 26 10/14/2021 0407   ALT 22 10/14/2021 0407   ALKPHOS 110 10/14/2021 0407   BILITOT 0.7 10/14/2021 0407   GFRNONAA 58 (L) 10/16/2021 0441   Lipase     Component Value Date/Time   LIPASE 31 10/07/2021 2336     Studies/Results: No results found.  Anti-infectives: Anti-infectives (From admission, onward)    Start     Dose/Rate Route Frequency Ordered Stop   10/10/21 0941  sodium chloride 0.9 % with cefoTEtan (CEFOTAN) ADS Med       Note to Pharmacy: Karsten Ro: cabinet override      10/10/21 0941 10/10/21 1209   10/10/21 0900  cefoTEtan (CEFOTAN) 2 g in sodium chloride 0.9 % 100 mL IVPB        2 g 200 mL/hr over 30 Minutes Intravenous On call to O.R. 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 Discussed w/ family - Patient with ROBF. Adv to soft diet. If tolerates for breakfast, can be discharged from our standpoint. Will arrange follow up. Discussed discharge instructions and call back precautions with patient and family.    FEN - Soft diet, IVF per [facility] VTE - SCDs, Lovenox ID - Cefotetan periop. WBC wnl. Afebrile. Discussed w/ MD. No need for abx at this time.  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

## 2021-10-20 ENCOUNTER — Ambulatory Visit: Payer: Medicare Other | Admitting: Physician Assistant

## 2021-10-22 DIAGNOSIS — R3129 Other microscopic hematuria: Secondary | ICD-10-CM | POA: Diagnosis not present

## 2021-10-22 DIAGNOSIS — R030 Elevated blood-pressure reading, without diagnosis of hypertension: Secondary | ICD-10-CM | POA: Diagnosis not present

## 2021-10-22 DIAGNOSIS — M810 Age-related osteoporosis without current pathological fracture: Secondary | ICD-10-CM | POA: Diagnosis not present

## 2021-10-22 DIAGNOSIS — K56609 Unspecified intestinal obstruction, unspecified as to partial versus complete obstruction: Secondary | ICD-10-CM | POA: Diagnosis not present

## 2021-10-31 ENCOUNTER — Encounter: Payer: Self-pay | Admitting: Physician Assistant

## 2021-10-31 ENCOUNTER — Ambulatory Visit: Payer: Medicare Other | Admitting: Physician Assistant

## 2021-10-31 VITALS — BP 124/73 | HR 62 | Ht 70.0 in | Wt 148.0 lb

## 2021-10-31 DIAGNOSIS — F0280 Dementia in other diseases classified elsewhere without behavioral disturbance: Secondary | ICD-10-CM | POA: Diagnosis not present

## 2021-10-31 DIAGNOSIS — G309 Alzheimer's disease, unspecified: Secondary | ICD-10-CM

## 2021-10-31 NOTE — Progress Notes (Signed)
Assessment/Plan:   Dementia likely due to    Recommendations:    Continue memantine  10 mg twice a day   Side effects were discussed Follow up in   months.   Case discussed with Dr. Delice Lesch who agrees with the plan              Spoke to [facility]. They met with his estate attorney and concerns were expressed so he called John. He does not have a handle on his finances. He has a hard time putting together a sentence, says he wants to take money from his account. They were at the patient's home, laptop was so old, could not find his credit card. They talked about writing a check, gave $4.05 instead of $450 dollars. He can sign but he cannot write them. [name] thinks they are doing bills jointly, but a lot is drafted from his account. Patient asked about 5x to get money to get money out of his IRA, he really did not need it, they did it online, put in a check in his checking account. He still stays pretty active. He is independent with dressing and bathing. Discussed that he should not be driving.   Patient's son Arnaldo Natal sent paperwork naming him as HCPOA, requesting opinion about his mental competency on his financial affairs. Discussed his concerns, patient has asked his friend [name] to do things/log on to his account randomly that do not make sense, ie asking him to get $100K from his [place]. Most of his finances are on autodraft, he has Fish farm manager, 2 pensions, and several 401Ks, and [name] wants to monitor things to make sure his father is protected and has money if needed in the future for ALF/SNF if needed. He got married to his girlfriend recently and family would like to protect him, patient went to his estate attorney 2 weeks after the marriage and could not give his wife's name.         Subjective:    Neno Devere Brem is a very pleasant 76 y.o. RH male  seen today in follow up for memory loss. This patient is accompanied in the office by his who supplements the  history.  Previous records as well as any outside records available were reviewed prior to todays visit.  Patient was last seen at our office on  at which time his  Patient is currently on   Any changes in memory since last visit? I am old  Patient lives with: Spouse who noticed changes as well.  Patient lives alone repeats oneself?  Disoriented when walking into a room?  Patient denies   Leaving objects in unusual places?  Patient denies   Ambulates  with difficulty?   Patient denies   Recent falls?  Patient denies  No falls  Any head injuries?  Patient denies   History of seizures?   Patient denies   Wandering behavior?  Patient denies   Patient drives?   Patient no longer drives  Patient drives with assistance  Patient uses [employer] to drive   Any mood changes No  Any history of depression?:No  Hallucinations?  Patient denies  No  Paranoia?  Patient denies  No  Patient reports that he sleeps well without vivid dreams, REM behavior or sleepwalking occ talks in his dreams  History of sleep apnea?  Patient denies   Any hygiene concerns?  Patient denies   Independent of bathing and dressing?  Endorsed  Does the patient needs  help with medications?  pillbox pill pack  Patient denies   Who is in charge of the finances?  Patient is in charge   is in charge   assists the patient  and denies missing any bills   occasionally misses a payment. Any changes in appetite?  Patient denies   Patient have trouble swallowing? Patient denies   Does the patient cook?  Patient denies   Any kitchen accidents such as leaving the stove on? Patient denies   Any headaches?  Patient denies   The double vision? Patient denies   Any focal numbness or tingling?  Patient denies   Chronic back pain Patient denies   Unilateral weakness?  Patient denies   Any tremors?  Patient denies   Any history of anosmia?  Patient denies   Any incontinence of urine?  Patient denies   Any bowel dysfunction? HAd BO 5/26 and had  sugery , impatient about running again   "INITIAL HISTORY OF PRESENT ILLNESS: This is a 76 year old right-handed man with a history of hyperparathyroidism s/p parathyroidectomy, neuropathy, presenting for evaluation of dementia. He is accompanied by his friend of 51 years, [name], who helps provide additional information. He feels his memory is pretty good. He has been living with his girlfriend for the past 1.5 years. He denies getting lost driving, missing medications or bill payments. He reports his girlfriend does not need to remind him of medications. She does most of the cooking. [name] got back in touch with him when he moved back to [place] 3 years ago and noticed memory changes at that time. Over the past 2 years, the memory lapses have progressed a little more to where he gets a little confused with issues and has trouble getting his sentences out. [name] has noticed he has difficulty composing a sentence, needing to stop and think. [name] has noticed he accumulates a lot of papers that they have tried to clean up, [name] thinks this is because he would forget and needs to look back on paperwork. [name] has helped him get plugged in with a PCP and arrange estate planning and [place]. [name] expressed concern about his driving with recognizing stoplights and time. He repeatedly says he does well with driving. No hygiene concerns. He has been on Donepezil 17m daily for a year, no side effects. DShanon Browhas not noticed any personality changes, paranoia or hallucinations. He states his mood is okay. He exercises regularly in the [employer] There is no family history of dementia, no history of significant head injuries. He drinks alcohol on occasion.    He denies any headaches, dizziness, diplopia, dysarthria, dysphagia, neck/back pain, focal numbness/tingling/weakness, bowel/bladder dysfunction. No anosmia, tremors, no falls. Sleep is good.    MRI brain with and without contrast done January 2020 no  acute changes, there is moderate diffuse atrophy, minimal chronic microvascular disease PREVIOUS MEDICATIONS:   CURRENT MEDICATIONS:  Outpatient Encounter Medications as of 10/31/2021  Medication Sig   acetaminophen (TYLENOL) 325 MG tablet Take 2 tablets (650 mg total) by mouth every 6 (six) hours as needed for mild pain, moderate pain or fever.   Cholecalciferol (VITAMIN D3) 25 MCG (1000 UT) CAPS Take 1,000 Units by mouth daily.   donepezil (ARICEPT) 10 MG tablet Take 1 tablet (10 mg total) by mouth at bedtime.   finasteride (PROSCAR) 5 MG tablet Take 5 mg by mouth daily.   memantine (NAMENDA) 10 MG tablet Take 1 tablet  twice a day (Patient taking  differently: Take 10 mg by mouth 2 (two) times daily.)   Nutritional Supplements (VITAMIN D MAINTENANCE PO) Take 1,000 mg by mouth.   polyethylene glycol (MIRALAX / GLYCOLAX) 17 g packet Take 17 g by mouth 2 (two) times daily.   tadalafil (CIALIS) 5 MG tablet Take 5 mg by mouth daily.   No facility-administered encounter medications on file as of 10/31/2021.       03/15/2020    9:00 AM  MMSE - Mini Mental State Exam  Orientation to time 0  Orientation to Place 1  Registration 3  Attention/ Calculation 0  Recall 3  Language- name 2 objects 2  Language- repeat 0  Language- follow 3 step command 0  Language- read & follow direction 0  Write a sentence 0  Copy design 0  Total score 9       No data to display          Objective:     PHYSICAL EXAMINATION:    VITALS:  There were no vitals filed for this visit.  GEN:  The patient appears stated age and is in NAD. HEENT:  Normocephalic, atraumatic.   Neurological examination:  General: NAD, well-groomed, appears stated age. Orientation: The patient is alert. Oriented to person, place and date Cranial nerves: There is good facial symmetry.The speech is fluent and clear. No aphasia or dysarthria. Fund of knowledge is appropriate. Recent and remote memory are impaired. Attention  and concentration are reduced.  Able to name objects and repeat phrases.  Hearing is intact to conversational tone.    Sensation: Sensation is intact to light touch throughout Motor: Strength is at least antigravity x4. Tremors: none  DTR's 2/4 in UE/LE     Movement examination: Tone: There is normal tone in the UE/LE Abnormal movements:  no tremor.  No myoclonus.  No asterixis.   Coordination:  There is no decremation with RAM's. Normal finger to nose  Gait and Station: The patient has no difficulty arising out of a deep-seated chair without the use of the hands. The patient's stride length is good.  Gait is cautious and narrow.    Thank you for allowing [place] the opportunity to participate in the care of this nice patient. Please do not hesitate to contact us for any questions or concerns.   Total time spent on today's visit was *** minutes dedicated to this patient today, preparing to see patient, examining the patient, ordering tests and/or medications and counseling the patient, documenting clinical information in the EHR or other health record, independently interpreting results and communicating results to the patient/family, discussing treatment and goals, answering patient's questions and coordinating care.  Cc:  Ginger Organ., MD  Sharene Butters 10/31/2021 12:48 PM

## 2021-10-31 NOTE — Patient Instructions (Addendum)
It was a pleasure to see you today at our office.   Recommendations:  Follow up in 6 months Continue donepezil 10 mg daily. Side effects were discussed  Continue Memantine 10 mg twice daily.Side effects were discussed   Whom to call:  Memory  decline, memory medications: Call our office [phone]   For psychiatric meds, mood meds: Please have your primary care physician manage these medications.   Counseling regarding caregiver distress, including caregiver depression, anxiety and issues regarding community resources, adult day care programs, adult living facilities, or memory care questions:   Feel free to contact [place], Social Worker at [phone]   For assessment of decision of mental capacity and competency:  Call Dr. Anthoney Harada, geriatric psychiatrist at [phone]  For guidance in geriatric dementia issues please call [facility] [phone]  For guidance regarding [facility] and if placement were needed at the facility, contact Arnell Asal, Social Worker tel: [phone]  If you have any severe symptoms of a stroke, or other severe issues such as confusion,severe chills or fever, etc call 911 or go to the ER as you may need to be evaluated further   Feel free to visit Facebook page " [email or web address]" for tips of how to care for people with memory problems.        RECOMMENDATIONS FOR ALL PATIENTS WITH MEMORY PROBLEMS: 1. Continue to exercise (Recommend 30 minutes of walking everyday, or 3 hours every week) 2. Increase social interactions - continue going to [place] and enjoy social gatherings with friends and family 3. Eat healthy, avoid fried foods and eat more fruits and vegetables 4. Maintain adequate blood pressure, blood sugar, and blood cholesterol level. Reducing the risk of stroke and cardiovascular disease also helps promoting better memory. 5. Avoid stressful situations. Live a simple life and avoid  aggravations. Organize your time and prepare for the next day in anticipation. 6. Sleep well, avoid any interruptions of sleep and avoid any distractions in the bedroom that may interfere with adequate sleep quality 7. Avoid sugar, avoid sweets as there is a strong link between excessive sugar intake, diabetes, and cognitive impairment We discussed the Mediterranean diet, which has been shown to help patients reduce the risk of progressive memory disorders and reduces cardiovascular risk. This includes eating fish, eat fruits and green leafy vegetables, nuts like almonds and hazelnuts, walnuts, and also use olive oil. Avoid fast foods and fried foods as much as possible. Avoid sweets and sugar as sugar use has been linked to worsening of memory function.  There is always a concern of gradual progression of memory problems. If this is the case, then we may need to adjust level of care according to patient needs. Support, both to the patient and caregiver, should then be put into place.    The Alzheimer's Association is here all day, every day for people facing Alzheimer's disease through our free 24/7 Helpline: [phone]. The Helpline provides reliable information and support to all those who need assistance, such as individuals living with memory loss, Alzheimer's or other dementia, caregivers, health care professionals and the public.  Our highly trained and knowledgeable staff can help you with: Understanding memory loss, dementia and Alzheimer's  Medications and other treatment options  General information about aging and brain health  Skills to provide quality care and to find the best care from professionals  Legal, financial and living-arrangement decisions Our Helpline also features: Confidential care consultation provided by master's level clinicians who can  help with decision-making support, crisis assistance and education on issues families face every day  Help in a caller's preferred  language using our translation service that features more than 200 languages and dialects  Referrals to local community programs, services and ongoing support     FALL PRECAUTIONS: Be cautious when walking. Scan the area for obstacles that may increase the risk of trips and falls. When getting up in the mornings, sit up at the edge of the bed for a few minutes before getting out of bed. Consider elevating the bed at the head end to avoid drop of blood pressure when getting up. Walk always in a well-lit room (use night lights in the walls). Avoid area rugs or power cords from appliances in the middle of the walkways. Use a walker or a cane if necessary and consider physical therapy for balance exercise. Get your eyesight checked regularly.  FINANCIAL OVERSIGHT: Supervision, especially oversight when making financial decisions or transactions is also recommended.  HOME SAFETY: Consider the safety of the kitchen when operating appliances like stoves, microwave oven, and blender. Consider having supervision and share cooking responsibilities until no longer able to participate in those. Accidents with firearms and other hazards in the house should be identified and addressed as well.   ABILITY TO BE LEFT ALONE: If patient is unable to contact 911 operator, consider using LifeLine, or when the need is there, arrange for someone to stay with patients. Smoking is a fire hazard, consider supervision or cessation. Risk of wandering should be assessed by caregiver and if detected at any point, supervision and safe proof recommendations should be instituted.  MEDICATION SUPERVISION: Inability to self-administer medication needs to be constantly addressed. Implement a mechanism to ensure safe administration of the medications.   DRIVING: Regarding driving, in patients with progressive memory problems, driving will be impaired. We advise to have someone else do the driving if trouble finding directions or if  minor accidents are reported. Independent driving assessment is available to determine safety of driving.   If you are interested in the driving assessment, you can contact the following:  [employer] in [place]  [place] [phone] or [phone]      [place] refers to food and lifestyle choices that are based on the traditions of countries located on the [employer]. This way of eating has been shown to help prevent certain conditions and improve outcomes for people who have chronic diseases, like kidney disease and heart disease. What are tips for following this plan? Lifestyle  Cook and eat meals together with your family, when possible. Drink enough fluid to keep your urine clear or pale yellow. Be physically active every day. This includes: Aerobic exercise like running or swimming. Leisure activities like gardening, walking, or housework. Get 7-8 hours of sleep each night. If recommended by your health care provider, drink red wine in moderation. This means 1 glass a day for nonpregnant women and 2 glasses a day for men. A glass of wine equals 5 oz (150 mL). Reading food labels  Check the serving size of packaged foods. For foods such as rice and pasta, the serving size refers to the amount of cooked product, not dry. Check the total fat in packaged foods. Avoid foods that have saturated fat or trans fats. Check the ingredients list for added sugars, such as corn syrup. Shopping  At the grocery store, buy  most of your food from the areas near the walls of the store. This includes: Fresh fruits and vegetables (produce). Grains, beans, nuts, and seeds. Some of these may be available in unpackaged forms or large amounts (in bulk). Fresh seafood. Poultry and eggs. Low-fat dairy products. Buy whole ingredients instead of  prepackaged foods. Buy fresh fruits and vegetables in-season from local farmers markets. Buy frozen fruits and vegetables in resealable bags. If you do not have access to quality fresh seafood, buy precooked frozen shrimp or canned fish, such as tuna, salmon, or sardines. Buy small amounts of raw or cooked vegetables, salads, or olives from the deli or salad bar at your store. Stock your pantry so you always have certain foods on hand, such as olive oil, canned tuna, canned tomatoes, rice, pasta, and beans. Cooking  Cook foods with extra-virgin olive oil instead of using butter or other vegetable oils. Have meat as a side dish, and have vegetables or grains as your main dish. This means having meat in small portions or adding small amounts of meat to foods like pasta or stew. Use beans or vegetables instead of meat in common dishes like chili or lasagna. Experiment with different cooking methods. Try roasting or broiling vegetables instead of steaming or sauteing them. Add frozen vegetables to soups, stews, pasta, or rice. Add nuts or seeds for added healthy fat at each meal. You can add these to yogurt, salads, or vegetable dishes. Marinate fish or vegetables using olive oil, lemon juice, garlic, and fresh herbs. Meal planning  Plan to eat 1 vegetarian meal one day each week. Try to work up to 2 vegetarian meals, if possible. Eat seafood 2 or more times a week. Have healthy snacks readily available, such as: Vegetable sticks with hummus. Greek yogurt. Fruit and nut trail mix. Eat balanced meals throughout the week. This includes: Fruit: 2-3 servings a day Vegetables: 4-5 servings a day Low-fat dairy: 2 servings a day Fish, poultry, or lean meat: 1 serving a day Beans and legumes: 2 or more servings a week Nuts and seeds: 1-2 servings a day Whole grains: 6-8 servings a day Extra-virgin olive oil: 3-4 servings a day Limit red meat and sweets to only a few servings a month What are my  food choices? Mediterranean diet Recommended Grains: Whole-grain pasta. Brown rice. Bulgar wheat. Polenta. Couscous. Whole-wheat bread. [place]. Vegetables: Artichokes. Beets. Broccoli. Cabbage. Carrots. Eggplant. Green beans. Chard. Kale. Spinach. Onions. Leeks. Peas. Squash. Tomatoes. Peppers. Radishes. Fruits: Apples. Apricots. Avocado. Berries. Bananas. Cherries. Dates. Figs. Grapes. Lemons. Melon. Oranges. Peaches. Plums. Pomegranate. Meats and other protein foods: Beans. Almonds. Sunflower seeds. Pine nuts. Peanuts. [place]. Salmon. Scallops. Shrimp. [place]. Tilapia. Clams. Oysters. Eggs. Dairy: Low-fat milk. Cheese. Greek yogurt. Beverages: Water. Red wine. Herbal tea. Fats and oils: Extra virgin olive oil. Avocado oil. Grape seed oil. Sweets and desserts: [place] yogurt with honey. Baked apples. Poached pears. Trail mix. Seasoning and other foods: Basil. Cilantro. Coriander. Cumin. Mint. Parsley. Sage. Rosemary. Tarragon. Garlic. Oregano. Thyme. Pepper. Balsalmic vinegar. Tahini. Hummus. Tomato sauce. Olives. Mushrooms. Limit these Grains: Prepackaged pasta or rice dishes. Prepackaged cereal with added sugar. Vegetables: Deep fried potatoes (french fries). Fruits: Fruit canned in syrup. Meats and other protein foods: Beef. Pork. Lamb. Poultry with skin. Hot dogs. [place] Salines. Dairy: Ice cream. Sour cream. Whole milk. Beverages: Juice. Sugar-sweetened soft drinks. Beer. Liquor and spirits. Fats and oils: Butter. Canola oil. Vegetable oil. Beef fat (tallow). Lard. Sweets and desserts: Cookies. Cakes. Pies. Candy. Seasoning and  other foods: Mayonnaise. Premade sauces and marinades. The items listed may not be a complete list. Talk with your dietitian about what dietary choices are right for you. Summary The Mediterranean diet includes both food and lifestyle choices. Eat a variety of fresh fruits and vegetables, beans, nuts, seeds, and whole grains. Limit the amount of red meat and sweets  that you eat. Talk with your health care provider about whether it is safe for you to drink red wine in moderation. This means 1 glass a day for nonpregnant women and 2 glasses a day for men. A glass of wine equals 5 oz (150 mL). This information is not intended to replace advice given to you by your health care provider. Make sure you discuss any questions you have with your health care provider. Document Released: 12/26/2015 Document Revised: 01/28/2016 Document Reviewed: 12/26/2015 Elsevier Interactive Patient Education  2017 [employer].

## 2021-11-12 DIAGNOSIS — H5213 Myopia, bilateral: Secondary | ICD-10-CM | POA: Diagnosis not present

## 2021-11-24 DIAGNOSIS — R3915 Urgency of urination: Secondary | ICD-10-CM | POA: Diagnosis not present

## 2021-11-24 DIAGNOSIS — R3912 Poor urinary stream: Secondary | ICD-10-CM | POA: Diagnosis not present

## 2021-11-24 DIAGNOSIS — N5201 Erectile dysfunction due to arterial insufficiency: Secondary | ICD-10-CM | POA: Diagnosis not present

## 2021-12-13 ENCOUNTER — Ambulatory Visit
Admission: EM | Admit: 2021-12-13 | Discharge: 2021-12-13 | Disposition: A | Discharge status: Home / Self Care | Payer: Medicare Other | Attending: Emergency Medicine | Admitting: Emergency Medicine

## 2021-12-13 DIAGNOSIS — K645 Perianal venous thrombosis: Secondary | ICD-10-CM | POA: Diagnosis not present

## 2021-12-13 MED ORDER — HYDROCORTISONE 1 % EX CREA: 1.0000 | TOPICAL_CREAM | Freq: Two times a day (BID) | CUTANEOUS | 0 refills | Status: DC

## 2021-12-13 NOTE — ED Provider Notes (Signed)
[facility]    CSN: [id number] Arrival date & time: 12/13/21  1516    HISTORY   Chief Complaint  Patient presents with   Hemorrhoids   HPI Jason Stephenson is a pleasant, 76 y.o. male who presents to urgent care today. Patient is here with his wife today who states patient has been complaining of hemorrhoids.  Patient had surgery for small bowel obstruction on July 20, wife states that she has been diligent in making sure he has MiraLAX and is kept his stool nice and soft.  Patient denies straining to move his bowels.  Patient denies a history of hemorrhoids.  Wife states that is very unlike him to complain about being in pain.  Wife states she bought over-the-counter Preparation H which she inserted herself whereupon she noticed that he had a hemorrhoid hanging out of his anus that is purple and angry looking.    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   Hyperparathyroidism ([place]) 03/09/2017   Hamstring strain 10/22/2015   Lateral epicondylitis of left elbow 06/28/2014   Greater trochanteric bursitis of left hip 01/23/2014   Lateral epicondylitis of right elbow 11/24/2011   LOW BACK PAIN, ACUTE 10/18/2008   Past Surgical History:  Procedure Laterality Date   COLONOSCOPY     LAPAROSCOPIC SMALL BOWEL RESECTION N/A 10/10/2021   Procedure: LAPAROSCOPIC SMALL BOWEL RESECTION;  Surgeon: Michael Boston, MD;  Location: [facility];  Service: General;  Laterality: N/A;  bilateral tap blocks     LAPAROSCOPY  10/10/2021   Procedure: LAPAROSCOPY DIAGNOSTIC;  Surgeon: Michael Boston, MD;  Location: [facility];  Service: General;;   parathyroid surgery  04/21/2017   POLYPECTOMY      Home Medications    Prior to Admission medications   Medication Sig Start Date End Date Taking? Authorizing Provider  hydrocortisone cream 1 % Apply 1 Application topically 2 (two) times daily. 12/13/21  Yes Lynden Oxford Scales, PA-C  Cholecalciferol (VITAMIN D3) 25 MCG (1000 UT) CAPS Take 1,000 Units by mouth daily.    [provider]  donepezil (ARICEPT) 10 MG tablet Take 1 tablet (10 mg total) by mouth at bedtime. 03/27/21   Rondel Jumbo, PA-C  finasteride (PROSCAR) 5 MG tablet Take 5 mg by mouth daily.    [provider]  memantine (NAMENDA) 10 MG tablet Take 1 tablet  twice a day Patient taking differently: Take 10 mg by mouth 2 (two) times daily. 03/27/21   Rondel Jumbo, PA-C  Nutritional Supplements (VITAMIN D MAINTENANCE PO) Take 1,000 mg by mouth.    [provider]  polyethylene glycol (MIRALAX / GLYCOLAX) 17 g packet Take 17 g by mouth 2 (two) times daily. 10/16/21   Hongalgi, Lenis Dickinson, MD  tadalafil (CIALIS) 5 MG tablet Take 5 mg by mouth daily.    [provider]    Family History Family History  Problem Relation Age of Onset   Stroke Father    Colon cancer Neg Hx    Colon polyps Neg Hx    Esophageal  cancer Neg Hx    Rectal cancer Neg Hx    Stomach cancer Neg Hx    Social History Social History   Tobacco Use   Smoking status: Former   Smokeless tobacco: Never  Scientific laboratory technician Use: Never used  Substance Use Topics   Alcohol use: Yes    Comment: rare   Drug use: No   Allergies   Probiotic product  Review of Systems Review of Systems Pertinent findings revealed after performing a 14 point review of systems has been noted in the history of present illness.  Physical Exam Triage Vital Signs ED Triage Vitals  Enc Vitals Group     BP  03/14/21 0827 (!) 147/82     Pulse Rate 03/14/21 0827 72     Resp 03/14/21 0827 18     Temp 03/14/21 0827 98.3 F (36.8 C)     Temp Source 03/14/21 0827 Oral     SpO2 03/14/21 0827 98 %     Weight --      Height --      Head Circumference --      Peak Flow --      Pain Score 03/14/21 0826 5     Pain Loc --      Pain Edu? --      Excl. in [place]? --   No data found.  Updated Vital Signs BP (!) 145/77 (BP Location: Left Arm)   Pulse (!) 50   Temp 97.8 F (36.6 C) (Oral)   Resp 16   SpO2 95%   Physical Exam Vitals and nursing note reviewed.  Constitutional:      General: He is not in acute distress.    Appearance: Normal appearance. He is normal weight. He is not ill-appearing.  HENT:     Head: Normocephalic and atraumatic.  Eyes:     Extraocular Movements: Extraocular movements intact.     Conjunctiva/sclera: Conjunctivae normal.     Pupils: Pupils are equal, round, and reactive to light.  Cardiovascular:     Rate and Rhythm: Normal rate and regular rhythm.  Pulmonary:     Effort: Pulmonary effort is normal.     Breath sounds: Normal breath sounds.  Genitourinary:    Comments: Patient politely declines rectal exam. Musculoskeletal:        General: Normal range of motion.     Cervical back: Normal range of motion and neck supple.  Skin:    General: Skin is warm and dry.  Neurological:     General: No focal deficit present.     Mental Status: He is alert and oriented to person, place, and time. Mental status is at baseline.  Psychiatric:        Mood and Affect: Mood normal.        Behavior: Behavior normal.        Thought Content: Thought content normal.        Judgment: Judgment normal.     Visual Acuity Right Eye Distance:   Left Eye Distance:   Bilateral Distance:    Right Eye Near:   Left Eye Near:    Bilateral Near:     UC Couse / Diagnostics / Procedures:     Radiology No results found.  Procedures Procedures (including critical care  time) EKG  Pending results:  Labs Reviewed - No data to display  Medications Ordered in UC: Medications - No data to display  UC Diagnoses / Final Clinical Impressions(s)   I  have reviewed the triage vital signs and the nursing notes.  Pertinent labs & imaging results that were available during my care of the patient were reviewed by me and considered in my medical decision making (see chart for details).    Final diagnoses:  Internal thrombosed hemorrhoids   Patient provided with a prescription for Proctocort advised to contact his general surgeon as soon as possible for follow-up.  ED Prescriptions     Medication Sig Dispense Auth. Provider   hydrocortisone cream 1 % Apply 1 Application topically 2 (two) times daily. 30 g Lynden Oxford Scales, PA-C      PDMP not reviewed this encounter.  Pending results:  Labs Reviewed - No data to display  Discharge Instructions:   Discharge Instructions      First thing on Monday morning, please reach out to your general surgeon to schedule follow-up for thrombosed hemorrhoid.  I provided you with a prescription for hydrocortisone cream that is used in the rectum twice daily to shrink inflamed hemorrhoid tissue.  This should provide you with some pain relief but definitive treatment is surgical.  Thank you for visiting urgent care today.    Disposition Upon Discharge:  Condition: stable for discharge home  Patient presented with an acute illness with associated systemic symptoms and significant discomfort requiring urgent management. In my opinion, this is a condition that a prudent lay person (someone who possesses an average knowledge of health and medicine) may potentially expect to result in complications if not addressed urgently such as respiratory distress, impairment of bodily function or dysfunction of bodily organs.   Routine symptom specific, illness specific and/or disease specific instructions were discussed with the  patient and/or caregiver at length.   As such, the patient has been evaluated and assessed, work-up was performed and treatment was provided in alignment with urgent care protocols and evidence based medicine.  Patient/parent/caregiver has been advised that the patient may require follow up for further testing and treatment if the symptoms continue in spite of treatment, as clinically indicated and appropriate.  Patient/parent/caregiver has been advised to return [facility] or PCP if no better; to PCP or the Emergency Department if new signs and symptoms develop, or if the current signs or symptoms continue to change or worsen for further workup, evaluation and treatment as clinically indicated and appropriate  The patient will follow up with their current PCP if and as advised. If the patient does not currently have a PCP we will assist them in obtaining one.   The patient may need specialty follow up if the symptoms continue, in spite of conservative treatment and management, for further workup, evaluation, consultation and treatment as clinically indicated and appropriate.   Patient/parent/caregiver verbalized understanding and agreement of plan as discussed.  All questions were addressed during visit.  Please see discharge instructions below for further details of plan.  This office note has been dictated using Museum/gallery curator.  Unfortunately, this method of dictation can sometimes lead to typographical or grammatical errors.  I apologize for your inconvenience in advance if this occurs.  Please do not hesitate to reach out to me if clarification is needed.      Lynden Oxford Scales, [place] 12/13/21 [phone]

## 2021-12-13 NOTE — Discharge Instructions (Signed)
First thing on Monday morning, please reach out to your general surgeon to schedule follow-up for thrombosed hemorrhoid.  I provided you with a prescription for hydrocortisone cream that is used in the rectum twice daily to shrink inflamed hemorrhoid tissue.  This should provide you with some pain relief but definitive treatment is surgical.  Thank you for visiting urgent care today.

## 2021-12-13 NOTE — ED Triage Notes (Signed)
Triage by Provider.

## 2022-01-26 ENCOUNTER — Ambulatory Visit (HOSPITAL_COMMUNITY)
Admission: RE | Admit: 2022-01-26 | Discharge: 2022-01-26 | Disposition: A | Discharge status: Home / Self Care | Payer: Medicare Other | Source: Ambulatory Visit | Attending: Internal Medicine | Admitting: Internal Medicine

## 2022-01-26 ENCOUNTER — Other Ambulatory Visit (HOSPITAL_COMMUNITY): Payer: Self-pay | Admitting: Internal Medicine

## 2022-01-26 DIAGNOSIS — M79662 Pain in left lower leg: Secondary | ICD-10-CM | POA: Diagnosis not present

## 2022-01-26 DIAGNOSIS — F02B18 Dementia in other diseases classified elsewhere, moderate, with other behavioral disturbance: Secondary | ICD-10-CM | POA: Diagnosis not present

## 2022-01-26 DIAGNOSIS — M7989 Other specified soft tissue disorders: Secondary | ICD-10-CM | POA: Insufficient documentation

## 2022-01-26 DIAGNOSIS — M79605 Pain in left leg: Secondary | ICD-10-CM | POA: Diagnosis not present

## 2022-01-26 DIAGNOSIS — G309 Alzheimer's disease, unspecified: Secondary | ICD-10-CM | POA: Diagnosis not present

## 2022-02-12 DIAGNOSIS — R82998 Other abnormal findings in urine: Secondary | ICD-10-CM | POA: Diagnosis not present

## 2022-02-12 DIAGNOSIS — R7989 Other specified abnormal findings of blood chemistry: Secondary | ICD-10-CM | POA: Diagnosis not present

## 2022-02-12 DIAGNOSIS — Z125 Encounter for screening for malignant neoplasm of prostate: Secondary | ICD-10-CM | POA: Diagnosis not present

## 2022-02-12 DIAGNOSIS — R5383 Other fatigue: Secondary | ICD-10-CM | POA: Diagnosis not present

## 2022-02-12 DIAGNOSIS — M810 Age-related osteoporosis without current pathological fracture: Secondary | ICD-10-CM | POA: Diagnosis not present

## 2022-02-19 DIAGNOSIS — F02A0 Dementia in other diseases classified elsewhere, mild, without behavioral disturbance, psychotic disturbance, mood disturbance, and anxiety: Secondary | ICD-10-CM | POA: Diagnosis not present

## 2022-02-19 DIAGNOSIS — N1831 Chronic kidney disease, stage 3a: Secondary | ICD-10-CM | POA: Diagnosis not present

## 2022-02-19 DIAGNOSIS — M810 Age-related osteoporosis without current pathological fracture: Secondary | ICD-10-CM | POA: Diagnosis not present

## 2022-02-19 DIAGNOSIS — Z0000 Encounter for general adult medical examination without abnormal findings: Secondary | ICD-10-CM | POA: Diagnosis not present

## 2022-04-04 ENCOUNTER — Other Ambulatory Visit: Payer: Self-pay | Admitting: Physician Assistant

## 2022-04-16 ENCOUNTER — Telehealth: Payer: Self-pay | Admitting: Physician Assistant

## 2022-04-16 NOTE — Telephone Encounter (Signed)
Patient wife called and states that patient is doing things in the middle of the night that he is not aware of. One time he left the house and went to a neighbor  house and the neighbor had to bring him home. She is concern about this and is would like to speak to someone about the issue he has a appt on  05-04-22. She is not sure if he need=s to change medication or something else please call

## 2022-04-16 NOTE — Telephone Encounter (Signed)
Advised and they are going to reach out to PCP, will call with update if needed. Thanked me for calling.

## 2022-04-30 ENCOUNTER — Other Ambulatory Visit: Payer: Self-pay | Admitting: Physician Assistant

## 2022-05-03 ENCOUNTER — Other Ambulatory Visit: Payer: Self-pay | Admitting: Physician Assistant

## 2022-05-04 ENCOUNTER — Encounter: Payer: Self-pay | Admitting: Physician Assistant

## 2022-05-04 ENCOUNTER — Ambulatory Visit: Payer: Medicare Other | Admitting: Physician Assistant

## 2022-05-04 DIAGNOSIS — Z029 Encounter for administrative examinations, unspecified: Secondary | ICD-10-CM

## 2022-05-04 NOTE — Progress Notes (Incomplete)
Assessment/Plan:   Dementia likely due to  [facility] is a very pleasant 76 y.o. RH male seen today in follow up for memory loss. Patient is currently on . MRI brain personally reviewed was remarkable for       Follow up in   months. Continue Memantine 10 mg twice daily. Side effects were discussed  Continue donepezil 10 mg daily. Side effects were discussed     Subjective:    This patient is accompanied in the office by *** who supplements the history.  Previous records as well as any outside records available were reviewed prior to todays visit.  He was last seen on 10/31/2021.  Last MoCA was 9/30 in 2021, unable to perform since.   Any changes in memory since last visit? repeats oneself?  Endorsed Disoriented when walking into a room?  Patient denies except occasionally not remembering what patient came to the room for ***  Leaving objects in unusual places?  Patient denies   Wandering behavior?  Endorsed, on 04/16/2022 "he was doing things in the middle of the night, he is not aware of, left the house and went to the neighbors house, the neighbor had to bring in home ".*** Any personality changes since last visit?  Patient denies   Any worsening depression?:  Patient denies   Hallucinations or paranoia?  Patient denies   Seizures?   Patient denies    Any sleep changes?  Denies vivid dreams, REM behavior or sleepwalking   Sleep apnea?  Patient denies   Any hygiene concerns?  Patient denies   Independent of bathing and dressing?  Endorsed  Does the patient needs help with medications? is in charge *** Who is in charge of the finances?   is in charge   *** Any changes in appetite?  Patient denies ***   Patient have trouble swallowing? Patient denies   Does the patient cook?  Any kitchen accidents such as leaving the stove on? Patient denies   Any headaches?  Patient denies   Chronic back pain Patient denies   Ambulates with difficulty?   Patient denies   Recent  falls or head injuries?  Patient denies     Unilateral weakness, numbness or tingling?  Patient denies   Any tremors?  Patient denies   Any anosmia?  Patient denies   Any incontinence of urine?  Patient denies   Any bowel dysfunction?   Patient denies      Patient lives  ***   "INITIAL HISTORY OF PRESENT ILLNESS: This is a 76 year old right-handed man with a history of hyperparathyroidism s/p parathyroidectomy, neuropathy, presenting for evaluation of dementia. He is accompanied by his friend of 37 years, [name], who helps provide additional information. He feels his memory is pretty good. He has been living with his girlfriend for the past 1.5 years. He denies getting lost driving, missing medications or bill payments. He reports his girlfriend does not need to remind him of medications. She does most of the cooking. [name] got back in touch with him when he moved back to [place] 3 years ago and noticed memory changes at that time. Over the past 2 years, the memory lapses have progressed a little more to where he gets a little confused with issues and has trouble getting his sentences out. [name] has noticed he has difficulty composing a sentence, needing to stop and think. [name] has noticed he accumulates a lot of papers that they have tried to clean  up, [name] thinks this is because he would forget and needs to look back on paperwork. [name] has helped him get plugged in with a PCP and arrange estate planning and [place]. [name] expressed concern about his driving with recognizing stoplights and time. He repeatedly says he does well with driving. No hygiene concerns. He has been on Donepezil '10mg'$  daily for a year, no side effects. [name] has not noticed any personality changes, paranoia or hallucinations. He states his mood is okay. He exercises regularly in the [employer]. There is no family history of dementia, no history of significant head injuries. He drinks alcohol on occasion.    He denies  any headaches, dizziness, diplopia, dysarthria, dysphagia, neck/back pain, focal numbness/tingling/weakness, bowel/bladder dysfunction. No anosmia, tremors, no falls. Sleep is good.    MRI brain with and without contrast done January 2020 no acute changes, there is moderate diffuse atrophy, minimal chronic microvascular disease  PREVIOUS MEDICATIONS:   CURRENT MEDICATIONS:  Outpatient Encounter Medications as of 05/04/2022  Medication Sig   Cholecalciferol (VITAMIN D3) 25 MCG (1000 UT) CAPS Take 1,000 Units by mouth daily.   donepezil (ARICEPT) 10 MG tablet Take 1 tablet (10 mg total) by mouth at bedtime.   finasteride (PROSCAR) 5 MG tablet Take 5 mg by mouth daily.   hydrocortisone cream 1 % Apply 1 Application topically 2 (two) times daily.   memantine (NAMENDA) 10 MG tablet Take 1 tablet by mouth twice daily   Nutritional Supplements (VITAMIN D MAINTENANCE PO) Take 1,000 mg by mouth.   polyethylene glycol (MIRALAX / GLYCOLAX) 17 g packet Take 17 g by mouth 2 (two) times daily.   tadalafil (CIALIS) 5 MG tablet Take 5 mg by mouth daily.   No facility-administered encounter medications on file as of 05/04/2022.       03/15/2020    9:00 AM  MMSE - Mini Mental State Exam  Orientation to time 0  Orientation to Place 1  Registration 3  Attention/ Calculation 0  Recall 3  Language- name 2 objects 2  Language- repeat 0  Language- follow 3 step command 0  Language- read & follow direction 0  Write a sentence 0  Copy design 0  Total score 9       No data to display          Objective:     PHYSICAL EXAMINATION:    VITALS:  There were no vitals filed for this visit.  GEN:  The patient appears stated age and is in NAD. HEENT:  Normocephalic, atraumatic.   Neurological examination:  General: NAD, well-groomed, appears stated age. Orientation: The patient is alert. Oriented to person, place and date Cranial nerves: There is good facial symmetry.The speech is not fluent  but clear. +aphasia or no dysarthria. Fund of knowledge is reduced. Recent and remote memory are impaired. Attention and concentration are reduced.  ***Able to name objects and repeat phrases.  Hearing is intact to conversational tone.    Sensation: Sensation is intact to light touch throughout Motor: Strength is at least antigravity x4. DTR's 2/4 in UE/LE     Movement examination: Tone: There is normal tone in the UE/LE Abnormal movements:  no tremor.  No myoclonus.  No asterixis.   Coordination:  There is no decremation with RAM's. Normal finger to nose  Gait and Station: The patient has no difficulty arising out of a deep-seated chair without the use of the hands. The patient's stride length is good.  Gait is cautious  and narrow.    Thank you for allowing [place] the opportunity to participate in the care of this nice patient. Please do not hesitate to contact us for any questions or concerns.   Total time spent on today's visit was *** minutes dedicated to this patient today, preparing to see patient, examining the patient, ordering tests and/or medications and counseling the patient, documenting clinical information in the EHR or other health record, independently interpreting results and communicating results to the patient/family, discussing treatment and goals, answering patient's questions and coordinating care.  Cc:  Ginger Organ., MD  Sharene Butters 05/04/2022 6:53 AM

## 2022-05-15 ENCOUNTER — Ambulatory Visit: Payer: Medicare Other | Admitting: Physician Assistant

## 2022-05-15 ENCOUNTER — Encounter: Payer: Self-pay | Admitting: Physician Assistant

## 2022-05-15 VITALS — BP 118/71 | HR 52 | Ht 70.0 in | Wt 153.0 lb

## 2022-05-15 DIAGNOSIS — G309 Alzheimer's disease, unspecified: Secondary | ICD-10-CM

## 2022-05-15 DIAGNOSIS — F0280 Dementia in other diseases classified elsewhere without behavioral disturbance: Secondary | ICD-10-CM

## 2022-05-15 NOTE — Patient Instructions (Addendum)
It was a pleasure to see you today at our office.   Recommendations:  Follow up in 6 months Continue donepezil 10 mg daily. Side effects were discussed  Continue Memantine 10 mg twice daily.Side effects were discussed   Whom to call:  Memory  decline, memory medications: Call our office [phone]   For psychiatric meds, mood meds: Please have your primary care physician manage these medications.      For assessment of decision of mental capacity and competency:  Call Dr. Anthoney Harada, geriatric psychiatrist at [phone]  For guidance in geriatric dementia issues please call [facility] [phone]     If you have any severe symptoms of a stroke, or other severe issues such as confusion,severe chills or fever, etc call 911 or go to the ER as you may need to be evaluated further       RECOMMENDATIONS FOR ALL PATIENTS WITH MEMORY PROBLEMS: 1. Continue to exercise (Recommend 30 minutes of walking everyday, or 3 hours every week) 2. Increase social interactions - continue going to [place] and enjoy social gatherings with friends and family 3. Eat healthy, avoid fried foods and eat more fruits and vegetables 4. Maintain adequate blood pressure, blood sugar, and blood cholesterol level. Reducing the risk of stroke and cardiovascular disease also helps promoting better memory. 5. Avoid stressful situations. Live a simple life and avoid aggravations. Organize your time and prepare for the next day in anticipation. 6. Sleep well, avoid any interruptions of sleep and avoid any distractions in the bedroom that may interfere with adequate sleep quality 7. Avoid sugar, avoid sweets as there is a strong link between excessive sugar intake, diabetes, and cognitive impairment We discussed the Mediterranean diet, which has been shown to help patients reduce the risk of progressive memory disorders and reduces cardiovascular risk. This includes eating fish, eat fruits and green  leafy vegetables, nuts like almonds and hazelnuts, walnuts, and also use olive oil. Avoid fast foods and fried foods as much as possible. Avoid sweets and sugar as sugar use has been linked to worsening of memory function.  There is always a concern of gradual progression of memory problems. If this is the case, then we may need to adjust level of care according to patient needs. Support, both to the patient and caregiver, should then be put into place.    The Alzheimer's Association is here all day, every day for people facing Alzheimer's disease through our free 24/7 Helpline: [phone]. The Helpline provides reliable information and support to all those who need assistance, such as individuals living with memory loss, Alzheimer's or other dementia, caregivers, health care professionals and the public.  Our highly trained and knowledgeable staff can help you with: Understanding memory loss, dementia and Alzheimer's  Medications and other treatment options  General information about aging and brain health  Skills to provide quality care and to find the best care from professionals  Legal, financial and living-arrangement decisions Our Helpline also features: Confidential care consultation provided by master's level clinicians who can help with decision-making support, crisis assistance and education on issues families face every day  Help in a caller's preferred language using our translation service that features more than 200 languages and dialects  Referrals to local community programs, services and ongoing support     FALL PRECAUTIONS: Be cautious when walking. Scan the area for obstacles that may increase the risk of trips and falls. When getting up in the mornings, sit up at the edge  of the bed for a few minutes before getting out of bed. Consider elevating the bed at the head end to avoid drop of blood pressure when getting up. Walk always in a well-lit room (use night lights in the  walls). Avoid area rugs or power cords from appliances in the middle of the walkways. Use a walker or a cane if necessary and consider physical therapy for balance exercise. Get your eyesight checked regularly.  FINANCIAL OVERSIGHT: Supervision, especially oversight when making financial decisions or transactions is also recommended.  HOME SAFETY: Consider the safety of the kitchen when operating appliances like stoves, microwave oven, and blender. Consider having supervision and share cooking responsibilities until no longer able to participate in those. Accidents with firearms and other hazards in the house should be identified and addressed as well.   ABILITY TO BE LEFT ALONE: If patient is unable to contact 911 operator, consider using LifeLine, or when the need is there, arrange for someone to stay with patients. Smoking is a fire hazard, consider supervision or cessation. Risk of wandering should be assessed by caregiver and if detected at any point, supervision and safe proof recommendations should be instituted.  MEDICATION SUPERVISION: Inability to self-administer medication needs to be constantly addressed. Implement a mechanism to ensure safe administration of the medications.   DRIVING: Regarding driving, in patients with progressive memory problems, driving will be impaired. We advise to have someone else do the driving if trouble finding directions or if minor accidents are reported. Independent driving assessment is available to determine safety of driving.   If you are interested in the driving assessment, you can contact the following:  [employer] in [place]  [place] [phone] or [phone]      [place] refers to food and lifestyle choices that are based on the traditions of countries located on the [place]. This way of eating has been shown to help prevent certain conditions and improve outcomes for people who have chronic diseases, like kidney disease and heart disease. What are tips for following this plan? Lifestyle  Cook and eat meals together with your family, when possible. Drink enough fluid to keep your urine clear or pale yellow. Be physically active every day. This includes: Aerobic exercise like running or swimming. Leisure activities like gardening, walking, or housework. Get 7-8 hours of sleep each night. If recommended by your health care provider, drink red wine in moderation. This means 1 glass a day for nonpregnant women and 2 glasses a day for men. A glass of wine equals 5 oz (150 mL). Reading food labels  Check the serving size of packaged foods. For foods such as rice and pasta, the serving size refers to the amount of cooked product, not dry. Check the total fat in packaged foods. Avoid foods that have saturated fat or trans fats. Check the ingredients list for added sugars, such as corn syrup. Shopping  At the grocery store, buy most of your food from the areas near the walls of the store. This includes: Fresh fruits and vegetables (produce). Grains, beans, nuts, and seeds. Some of these may be available in unpackaged forms or large amounts (in bulk). Fresh seafood. Poultry and eggs. Low-fat dairy products. Buy whole ingredients instead of prepackaged foods. Buy fresh fruits and vegetables in-season from local farmers markets. Buy frozen fruits and vegetables in resealable bags. If you do not have access  to quality fresh seafood, buy precooked frozen shrimp or canned fish, such as tuna, salmon, or sardines. Buy small amounts of raw or cooked vegetables, salads, or olives from the deli or salad bar at your store. Stock your pantry so you always have certain foods on hand, such as olive oil, canned tuna, canned tomatoes, rice, pasta, and beans. Cooking  Cook foods  with extra-virgin olive oil instead of using butter or other vegetable oils. Have meat as a side dish, and have vegetables or grains as your main dish. This means having meat in small portions or adding small amounts of meat to foods like pasta or stew. Use beans or vegetables instead of meat in common dishes like chili or lasagna. Experiment with different cooking methods. Try roasting or broiling vegetables instead of steaming or sauteing them. Add frozen vegetables to soups, stews, pasta, or rice. Add nuts or seeds for added healthy fat at each meal. You can add these to yogurt, salads, or vegetable dishes. Marinate fish or vegetables using olive oil, lemon juice, garlic, and fresh herbs. Meal planning  Plan to eat 1 vegetarian meal one day each week. Try to work up to 2 vegetarian meals, if possible. Eat seafood 2 or more times a week. Have healthy snacks readily available, such as: Vegetable sticks with hummus. Greek yogurt. Fruit and nut trail mix. Eat balanced meals throughout the week. This includes: Fruit: 2-3 servings a day Vegetables: 4-5 servings a day Low-fat dairy: 2 servings a day Fish, poultry, or lean meat: 1 serving a day Beans and legumes: 2 or more servings a week Nuts and seeds: 1-2 servings a day Whole grains: 6-8 servings a day Extra-virgin olive oil: 3-4 servings a day Limit red meat and sweets to only a few servings a month What are my food choices? Mediterranean diet Recommended Grains: Whole-grain pasta. Brown rice. Bulgar wheat. Polenta. Couscous. Whole-wheat bread. [place]. Vegetables: Artichokes. Beets. Broccoli. Cabbage. Carrots. Eggplant. Green beans. Chard. Kale. Spinach. Onions. Leeks. Peas. Squash. Tomatoes. Peppers. Radishes. Fruits: Apples. Apricots. Avocado. Berries. Bananas. Cherries. Dates. Figs. Grapes. Lemons. Melon. Oranges. Peaches. Plums. Pomegranate. Meats and other protein foods: Beans. Almonds. Sunflower seeds. Pine nuts. Peanuts.  [place]. Salmon. Scallops. Shrimp. [place]. Tilapia. Clams. Oysters. Eggs. Dairy: Low-fat milk. Cheese. Greek yogurt. Beverages: Water. Red wine. Herbal tea. Fats and oils: Extra virgin olive oil. Avocado oil. Grape seed oil. Sweets and desserts: [place] yogurt with honey. Baked apples. Poached pears. Trail mix. Seasoning and other foods: Basil. Cilantro. Coriander. Cumin. Mint. Parsley. Sage. Rosemary. Tarragon. Garlic. Oregano. Thyme. Pepper. Balsalmic vinegar. Tahini. Hummus. Tomato sauce. Olives. Mushrooms. Limit these Grains: Prepackaged pasta or rice dishes. Prepackaged cereal with added sugar. Vegetables: Deep fried potatoes (french fries). Fruits: Fruit canned in syrup. Meats and other protein foods: Beef. Pork. Lamb. Poultry with skin. Hot dogs. [place] Salines. Dairy: Ice cream. Sour cream. Whole milk. Beverages: Juice. Sugar-sweetened soft drinks. Beer. Liquor and spirits. Fats and oils: Butter. Canola oil. Vegetable oil. Beef fat (tallow). Lard. Sweets and desserts: Cookies. Cakes. Pies. Candy. Seasoning and other foods: Mayonnaise. Premade sauces and marinades. The items listed may not be a complete list. Talk with your dietitian about what dietary choices are right for you. Summary The Mediterranean diet includes both food and lifestyle choices. Eat a variety of fresh fruits and vegetables, beans, nuts, seeds, and whole grains. Limit the amount of red meat and sweets that you eat. Talk with your health care provider about whether it is safe for you to drink  red wine in moderation. This means 1 glass a day for nonpregnant women and 2 glasses a day for men. A glass of wine equals 5 oz (150 mL). This information is not intended to replace advice given to you by your health care provider. Make sure you discuss any questions you have with your health care provider. Document Released: 12/26/2015 Document Revised: 01/28/2016 Document Reviewed: 12/26/2015 Elsevier Interactive Patient Education  2017  [employer].

## 2022-05-15 NOTE — Progress Notes (Signed)
Assessment/Plan:   Dementia likely due to Alzheimer's Disease  Jason Stephenson is a very pleasant 76 y.o. RH male with a history of hyperparathyroidism, peripheral neuropathy, moderate non fluent aphasia  and moderate dementia likely due to Alzheimer's seen today in follow up for memory loss. Patient is currently on memantine 10 mg bid and donepezil 10 mg daily, tolerating well.  However, his wife is concerned that the patient may be having some very vivid dreams versus hallucination.  Wife is concerned that perhaps is Aricept although the patient has been taking it for a long time.  However, this hallucinations may very well be possibly due to progression of the disease.  Recommendations    Follow up in 6  months.  Continue memantine 10 mg bid  Patient takes donepezil at night, recommend changing donepezil to the morning dose, and see if there are any changes in his nocturnal behavior.  If no improvement, will consider medications to help with these visual hallucinations versus vivid dreams.  Will then consider Depakote or Seroquel. Recommend 24/7 monitoring for safety    Subjective:    This patient is accompanied in the office by his wife who supplements the history.  Previous records as well as any outside records available were reviewed prior to todays visit. Patient was last seen on 10/31/2021   Any changes in memory since last visit? He has difficulty with both STM And LTM.  "Some days are better than other. ".  His wife states that he still able to remember very recent conversations and names of people.   repeats oneself? Denies  Disoriented when walking into a room?   Denies Leaving objects in unusual places?    denies   Wandering behavior?  Endorsed  One time he went to a neighbor's house, but that was without recurrence any personality changes since last visit?  His mood is good, "I got a puppy ", his wife says that having gotten these dog has helped him emotionally.   Any  worsening depression?:  denies   Hallucinations or paranoia?  denies   Seizures?    denies    Any sleep changes?  Dreams are real "about these people coming to the bedroom, sometimes he feels that they want hurting ".  His wife is not sure if these are dreams or hallucinations.  He denies any REM behavior or sleepwalking.  These events are sporadic, "not every day "and they are unpredictable.  Last event was yesterday with similar description. Sleep apnea?   denies   Any hygiene concerns?  denies   Independent of bathing and dressing?  Endorsed  Does the patient needs help with medications? Wife is in charge  Who is in charge of the finances? He is in charge, wife monitors, bills on autopay     Any changes in appetite?  Denies Patient have trouble swallowing?  denies   Does the patient cook?  No  Any kitchen accidents such as leaving the stove on? Patient denies   Any headaches?   denies   Chronic back pain  denies   Ambulates with difficulty?  He continues to be very active, runs. Recent falls or head injuries? denies     Unilateral weakness, numbness or tingling?    denies   Any tremors?  denies   Any anosmia?  Patient denies   Any incontinence of urine?  denies   Any bowel dysfunction?     denies      Patient lives  Wife.  Apparently, his son has placed in on the [place], although the wife does not want him to move they are, she wants him to live at home with her.   Does the patient drive? He does not drive   "INITIAL HISTORY OF PRESENT ILLNESS: This is a 76 year old right-handed man with a history of hyperparathyroidism s/p parathyroidectomy, neuropathy, presenting for evaluation of dementia. He is accompanied by his friend of 32 years, [name], who helps provide additional information. He feels his memory is pretty good. He has been living with his girlfriend for the past 1.5 years. He denies getting lost driving, missing medications or bill payments. He reports his  girlfriend does not need to remind him of medications. She does most of the cooking. [name] got back in touch with him when he moved back to [place] 3 years ago and noticed memory changes at that time. Over the past 2 years, the memory lapses have progressed a little more to where he gets a little confused with issues and has trouble getting his sentences out. [name] has noticed he has difficulty composing a sentence, needing to stop and think. [name] has noticed he accumulates a lot of papers that they have tried to clean up, [name] thinks this is because he would forget and needs to look back on paperwork. [name] has helped him get plugged in with a PCP and arrange estate planning and [place]. [name] expressed concern about his driving with recognizing stoplights and time. He repeatedly says he does well with driving. No hygiene concerns. He has been on Donepezil '10mg'$  daily for a year, no side effects. [name] has not noticed any personality changes, paranoia or hallucinations. He states his mood is okay. He exercises regularly in the [employer]. There is no family history of dementia, no history of significant head injuries. He drinks alcohol on occasion.    He denies any headaches, dizziness, diplopia, dysarthria, dysphagia, neck/back pain, focal numbness/tingling/weakness, bowel/bladder dysfunction. No anosmia, tremors, no falls. Sleep is good.    Personally reviewed MRI brain with and without contrast done January 2020 no acute changes, there is moderate diffuse atrophy, minimal chronic microvascular disease  PREVIOUS MEDICATIONS:   CURRENT MEDICATIONS:  Outpatient Encounter Medications as of 05/15/2022  Medication Sig   Cholecalciferol (VITAMIN D3) 25 MCG (1000 UT) CAPS Take 1,000 Units by mouth daily.   donepezil (ARICEPT) 10 MG tablet TAKE 1 TABLET BY MOUTH AT BEDTIME   finasteride (PROSCAR) 5 MG tablet Take 5 mg by mouth daily.   hydrocortisone cream 1 % Apply 1 Application topically 2 (two)  times daily.   memantine (NAMENDA) 10 MG tablet Take 1 tablet by mouth twice daily   Nutritional Supplements (VITAMIN D MAINTENANCE PO) Take 1,000 mg by mouth.   polyethylene glycol (MIRALAX / GLYCOLAX) 17 g packet Take 17 g by mouth 2 (two) times daily.   tadalafil (CIALIS) 5 MG tablet Take 5 mg by mouth daily.   No facility-administered encounter medications on file as of 05/15/2022.       03/15/2020    9:00 AM  MMSE - Mini Mental State Exam  Orientation to time 0  Orientation to Place 1  Registration 3  Attention/ Calculation 0  Recall 3  Language- name 2 objects 2  Language- repeat 0  Language- follow 3 step command 0  Language- read & follow direction 0  Write a sentence 0  Copy design 0  Total score 9  No data to display          Objective:     PHYSICAL EXAMINATION:    VITALS:   Vitals:   05/15/22 0903  BP: 118/71  Pulse: (!) 52  SpO2: 97%  Weight: 153 lb (69.4 kg)  Height: '5\' 10"'$  (1.778 m)    GEN:  The patient appears stated age and is in NAD. HEENT:  Normocephalic, atraumatic.   Neurological examination:  General: NAD, well-groomed, appears stated age. Orientation: The patient is alert. Oriented to person, place and date Cranial nerves: There is good facial symmetry.The speech is not fluent but clear.  Mild aphasia or dysarthria. Fund of knowledge is reduced. Recent and remote memory are impaired. Attention and concentration are reduced.  Able to name objects and repeat phrases.  Hearing is intact to conversational tone.    Sensation: Sensation is intact to light touch throughout Motor: Strength is at least antigravity x4. DTR's 2/4 in UE/LE     Movement examination: Tone: There is normal tone in the UE/LE Abnormal movements: Very mild left handed tremor, intermittent, no cogwheeling.  No other parkinsonian signs.  No myoclonus.  No asterixis.   Coordination:  There is some decremation with RAM's. Reduced coordination with finger to nose   Gait and Station: The patient has no difficulty arising out of a deep-seated chair without the use of the hands. The patient's stride length is good.  Gait is cautious and narrow.    Thank you for allowing [place] the opportunity to participate in the care of this nice patient. Please do not hesitate to contact us for any questions or concerns.   Total time spent on today's visit was 33 minutes dedicated to this patient today, preparing to see patient, examining the patient, ordering tests and/or medications and counseling the patient, documenting clinical information in the EHR or other health record, independently interpreting results and communicating results to the patient/family, discussing treatment and goals, answering patient's questions and coordinating care.  Cc:  Ginger Organ., MD  Sharene Butters 05/15/2022 2:26 PM

## 2022-05-16 ENCOUNTER — Ambulatory Visit (INDEPENDENT_AMBULATORY_CARE_PROVIDER_SITE_OTHER): Payer: Medicare Other

## 2022-05-16 ENCOUNTER — Ambulatory Visit
Admission: EM | Admit: 2022-05-16 | Discharge: 2022-05-16 | Disposition: A | Discharge status: Home / Self Care | Payer: Medicare Other | Attending: Emergency Medicine | Admitting: Emergency Medicine

## 2022-05-16 DIAGNOSIS — M5137 Other intervertebral disc degeneration, lumbosacral region: Secondary | ICD-10-CM | POA: Diagnosis not present

## 2022-05-16 DIAGNOSIS — M4317 Spondylolisthesis, lumbosacral region: Secondary | ICD-10-CM

## 2022-05-16 DIAGNOSIS — M25561 Pain in right knee: Secondary | ICD-10-CM | POA: Diagnosis not present

## 2022-05-16 DIAGNOSIS — M47816 Spondylosis without myelopathy or radiculopathy, lumbar region: Secondary | ICD-10-CM

## 2022-05-16 DIAGNOSIS — M545 Low back pain, unspecified: Secondary | ICD-10-CM | POA: Diagnosis not present

## 2022-05-16 DIAGNOSIS — M1711 Unilateral primary osteoarthritis, right knee: Secondary | ICD-10-CM | POA: Diagnosis not present

## 2022-05-16 MED ORDER — ACETAMINOPHEN 500 MG PO TABS: 1000.0000 mg | ORAL_TABLET | Freq: Three times a day (TID) | ORAL | 0 refills | Status: AC

## 2022-05-16 MED ORDER — IBUPROFEN 400 MG PO TABS: 400.0000 mg | ORAL_TABLET | Freq: Three times a day (TID) | ORAL | 0 refills | Status: DC | PRN

## 2022-05-16 MED ORDER — DICLOFENAC SODIUM 1 % EX GEL: 4.0000 g | Freq: Four times a day (QID) | CUTANEOUS | 2 refills | Status: DC
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
## 2022-05-19 DIAGNOSIS — R413 Other amnesia: Secondary | ICD-10-CM | POA: Diagnosis not present

## 2022-05-19 DIAGNOSIS — Z79899 Other long term (current) drug therapy: Secondary | ICD-10-CM | POA: Diagnosis not present

## 2022-05-19 DIAGNOSIS — R4702 Dysphasia: Secondary | ICD-10-CM | POA: Diagnosis not present

## 2022-05-19 DIAGNOSIS — N529 Male erectile dysfunction, unspecified: Secondary | ICD-10-CM | POA: Diagnosis not present

## 2022-05-27 ENCOUNTER — Telehealth: Payer: Self-pay

## 2022-05-27 ENCOUNTER — Other Ambulatory Visit: Payer: Self-pay | Admitting: Physician Assistant

## 2022-05-27 MED ORDER — DIVALPROEX SODIUM 125 MG PO DR TAB: 125.0000 mg | DELAYED_RELEASE_TABLET | Freq: Every evening | ORAL | 3 refills | Status: DC

## 2022-05-27 NOTE — Telephone Encounter (Signed)
Patient's wife wanted to update [place] that Jason Stephenson is still having those dreams and wanted to know if Jason Stephenson would start the Seroquel

## 2022-05-27 NOTE — Telephone Encounter (Signed)
I advised to [facility] and she thanked me for calling, she will update soon with update on his condition

## 2022-06-02 ENCOUNTER — Other Ambulatory Visit: Payer: Self-pay | Admitting: Physician Assistant

## 2022-06-04 DIAGNOSIS — N3941 Urge incontinence: Secondary | ICD-10-CM | POA: Diagnosis not present

## 2022-06-05 DIAGNOSIS — R4702 Dysphasia: Secondary | ICD-10-CM | POA: Diagnosis not present

## 2022-06-05 DIAGNOSIS — G308 Other Alzheimer's disease: Secondary | ICD-10-CM | POA: Diagnosis not present

## 2022-06-05 DIAGNOSIS — G4709 Other insomnia: Secondary | ICD-10-CM | POA: Diagnosis not present

## 2022-06-09 ENCOUNTER — Other Ambulatory Visit: Payer: Self-pay | Admitting: Physician Assistant

## 2022-07-04 ENCOUNTER — Emergency Department (HOSPITAL_COMMUNITY): Payer: Medicare Other

## 2022-07-04 ENCOUNTER — Inpatient Hospital Stay (HOSPITAL_COMMUNITY): Payer: Medicare Other

## 2022-07-04 ENCOUNTER — Inpatient Hospital Stay (HOSPITAL_COMMUNITY)
Admission: EM | Admit: 2022-07-04 | Discharge: 2022-07-07 | DRG: 389 | Disposition: A | Discharge status: Home / Self Care | Payer: Medicare Other | Attending: Internal Medicine | Admitting: Internal Medicine

## 2022-07-04 ENCOUNTER — Encounter (HOSPITAL_COMMUNITY): Payer: Self-pay

## 2022-07-04 ENCOUNTER — Other Ambulatory Visit: Payer: Self-pay

## 2022-07-04 DIAGNOSIS — F03C11 Unspecified dementia, severe, with agitation: Secondary | ICD-10-CM | POA: Diagnosis not present

## 2022-07-04 DIAGNOSIS — Z823 Family history of stroke: Secondary | ICD-10-CM

## 2022-07-04 DIAGNOSIS — Z87891 Personal history of nicotine dependence: Secondary | ICD-10-CM | POA: Diagnosis not present

## 2022-07-04 DIAGNOSIS — R109 Unspecified abdominal pain: Secondary | ICD-10-CM | POA: Diagnosis not present

## 2022-07-04 DIAGNOSIS — Z66 Do not resuscitate: Secondary | ICD-10-CM | POA: Diagnosis not present

## 2022-07-04 DIAGNOSIS — Z4682 Encounter for fitting and adjustment of non-vascular catheter: Secondary | ICD-10-CM | POA: Diagnosis not present

## 2022-07-04 DIAGNOSIS — R410 Disorientation, unspecified: Secondary | ICD-10-CM | POA: Diagnosis not present

## 2022-07-04 DIAGNOSIS — K56609 Unspecified intestinal obstruction, unspecified as to partial versus complete obstruction: Principal | ICD-10-CM

## 2022-07-04 DIAGNOSIS — K56601 Complete intestinal obstruction, unspecified as to cause: Principal | ICD-10-CM | POA: Diagnosis present

## 2022-07-04 DIAGNOSIS — Z79899 Other long term (current) drug therapy: Secondary | ICD-10-CM

## 2022-07-04 DIAGNOSIS — Z452 Encounter for adjustment and management of vascular access device: Secondary | ICD-10-CM | POA: Diagnosis not present

## 2022-07-04 DIAGNOSIS — Z9049 Acquired absence of other specified parts of digestive tract: Secondary | ICD-10-CM | POA: Diagnosis not present

## 2022-07-04 DIAGNOSIS — F03918 Unspecified dementia, unspecified severity, with other behavioral disturbance: Secondary | ICD-10-CM | POA: Diagnosis present

## 2022-07-04 DIAGNOSIS — K6389 Other specified diseases of intestine: Secondary | ICD-10-CM | POA: Diagnosis not present

## 2022-07-04 DIAGNOSIS — Z743 Need for continuous supervision: Secondary | ICD-10-CM | POA: Diagnosis not present

## 2022-07-04 DIAGNOSIS — F03C18 Unspecified dementia, severe, with other behavioral disturbance: Secondary | ICD-10-CM | POA: Diagnosis not present

## 2022-07-04 DIAGNOSIS — K5669 Other partial intestinal obstruction: Secondary | ICD-10-CM | POA: Diagnosis not present

## 2022-07-04 DIAGNOSIS — I700 Atherosclerosis of aorta: Secondary | ICD-10-CM | POA: Diagnosis not present

## 2022-07-04 LAB — COMPREHENSIVE METABOLIC PANEL
ALT: 13 U/L (ref 0–44)
AST: 20 U/L (ref 15–41)
Albumin: 3.5 g/dL (ref 3.5–5.0)
Alkaline Phosphatase: 65 U/L (ref 38–126)
Anion gap: 11 (ref 5–15)
BUN: 21 mg/dL (ref 8–23)
CO2: 24 mmol/L (ref 22–32)
Calcium: 8.7 mg/dL — ABNORMAL LOW (ref 8.9–10.3)
Chloride: 105 mmol/L (ref 98–111)
Creatinine, Ser: 1.23 mg/dL (ref 0.61–1.24)
GFR, Estimated: >60 mL/min (ref >60)
Glucose, Bld: 106 mg/dL — ABNORMAL HIGH (ref 70–99)
Potassium: 3.6 mmol/L (ref 3.5–5.1)
Sodium: 140 mmol/L (ref 135–145)
Total Bilirubin: 0.9 mg/dL (ref 0.3–1.2)
Total Protein: 5.9 g/dL — ABNORMAL LOW (ref 6.5–8.1)

## 2022-07-04 LAB — CBC WITH DIFFERENTIAL/PLATELET
Abs Immature Granulocytes: 0.04 10*3/uL (ref 0.00–0.07)
Basophils Absolute: 0 10*3/uL (ref 0.0–0.1)
Basophils Relative: 0 %
Eosinophils Absolute: 0.1 10*3/uL (ref 0.0–0.5)
Eosinophils Relative: 1 %
HCT: 46.3 % (ref 39.0–52.0)
Hemoglobin: 15.9 g/dL (ref 13.0–17.0)
Immature Granulocytes: 0 %
Lymphocytes Relative: 7 %
Lymphs Abs: 0.9 10*3/uL (ref 0.7–4.0)
MCH: 31.5 pg (ref 26.0–34.0)
MCHC: 34.3 g/dL (ref 30.0–36.0)
MCV: 91.9 fL (ref 80.0–100.0)
Monocytes Absolute: 0.6 10*3/uL (ref 0.1–1.0)
Monocytes Relative: 4 %
Neutro Abs: 11 10*3/uL — ABNORMAL HIGH (ref 1.7–7.7)
Neutrophils Relative %: 88 %
Platelets: 245 10*3/uL (ref 150–400)
RBC: 5.04 MIL/uL (ref 4.22–5.81)
RDW: 12 % (ref 11.5–15.5)
WBC: 12.6 10*3/uL — ABNORMAL HIGH (ref 4.0–10.5)
nRBC: 0 % (ref 0.0–0.2)

## 2022-07-04 LAB — URINALYSIS, ROUTINE W REFLEX MICROSCOPIC
Bilirubin Urine: NEGATIVE
Glucose, UA: NEGATIVE mg/dL
Hgb urine dipstick: NEGATIVE
Ketones, ur: NEGATIVE mg/dL
Leukocytes,Ua: NEGATIVE
Nitrite: NEGATIVE
Protein, ur: NEGATIVE mg/dL
Specific Gravity, Urine: 1.023 (ref 1.005–1.030)
pH: 7 (ref 5.0–8.0)

## 2022-07-04 LAB — LACTIC ACID, PLASMA
Lactic Acid, Venous: 1.3 mmol/L (ref 0.5–1.9)
Lactic Acid, Venous: 0.9 mmol/L (ref 0.5–1.9)

## 2022-07-04 LAB — LIPASE, BLOOD: Lipase: 41 U/L (ref 11–51)

## 2022-07-04 MED ORDER — SODIUM CHLORIDE 0.9 % IV BOLUS
500.0000 mL | Freq: Once | INTRAVENOUS | Status: AC
  Administered 2022-07-04: 500 mL via INTRAVENOUS

## 2022-07-04 MED ORDER — HYDROMORPHONE HCL 1 MG/ML IJ SOLN
0.5000 mg | Freq: Once | INTRAMUSCULAR | Status: AC
  Administered 2022-07-04: 0.5 mg via INTRAVENOUS
  Filled 2022-07-04: qty 1

## 2022-07-04 MED ORDER — MORPHINE SULFATE (PF) 2 MG/ML IV SOLN
2.0000 mg | INTRAVENOUS | Status: DC | PRN
  Administered 2022-07-04 (×2): 2 mg via INTRAVENOUS
  Filled 2022-07-04 (×2): qty 1

## 2022-07-04 MED ORDER — VALPROATE SODIUM 100 MG/ML IV SOLN: 250.0000 mg | Freq: Every day | INTRAVENOUS | Status: DC

## 2022-07-04 MED ORDER — HYDROMORPHONE HCL 1 MG/ML IJ SOLN
1.0000 mg | Freq: Once | INTRAMUSCULAR | Status: AC
  Administered 2022-07-04: 1 mg via INTRAVENOUS
  Filled 2022-07-04: qty 1

## 2022-07-04 MED ORDER — ONDANSETRON HCL 4 MG/2ML IJ SOLN
4.0000 mg | Freq: Once | INTRAMUSCULAR | Status: AC
  Administered 2022-07-04: 4 mg via INTRAVENOUS
  Filled 2022-07-04: qty 2

## 2022-07-04 MED ORDER — ENOXAPARIN SODIUM 40 MG/0.4ML IJ SOSY
40.0000 mg | PREFILLED_SYRINGE | INTRAMUSCULAR | Status: DC
  Administered 2022-07-04 – 2022-07-06 (×3): 40 mg via SUBCUTANEOUS
  Filled 2022-07-04 (×3): qty 0.4

## 2022-07-04 MED ORDER — DIVALPROEX SODIUM 125 MG PO DR TAB: 125.0000 mg | DELAYED_RELEASE_TABLET | Freq: Every day | ORAL | Status: DC

## 2022-07-04 MED ORDER — POTASSIUM CHLORIDE IN NACL 20-0.9 MEQ/L-% IV SOLN
INTRAVENOUS | Status: DC
  Filled 2022-07-04 (×8): qty 1000

## 2022-07-04 MED ORDER — IOHEXOL 300 MG/ML  SOLN
100.0000 mL | Freq: Once | INTRAMUSCULAR | Status: AC | PRN
  Administered 2022-07-04: 100 mL via INTRAVENOUS

## 2022-07-04 MED ORDER — ONDANSETRON HCL 4 MG PO TABS: 4.0000 mg | ORAL_TABLET | Freq: Four times a day (QID) | ORAL | Status: DC | PRN

## 2022-07-04 MED ORDER — QUETIAPINE FUMARATE 25 MG PO TABS
25.0000 mg | ORAL_TABLET | Freq: Every day | ORAL | Status: DC
  Administered 2022-07-04 – 2022-07-06 (×3): 25 mg via ORAL
  Filled 2022-07-04 (×3): qty 1

## 2022-07-04 MED ORDER — FINASTERIDE 5 MG PO TABS: 5.0000 mg | ORAL_TABLET | Freq: Every day | ORAL | Status: DC

## 2022-07-04 MED ORDER — SODIUM CHLORIDE 0.9 % IV SOLN: Freq: Once | INTRAVENOUS | Status: AC

## 2022-07-04 MED ORDER — VALPROATE SODIUM 100 MG/ML IV SOLN
125.0000 mg | Freq: Every day | INTRAVENOUS | Status: DC
  Filled 2022-07-04: qty 1.25

## 2022-07-04 MED ORDER — HYDROMORPHONE HCL 1 MG/ML IJ SOLN
1.0000 mg | Freq: Once | INTRAMUSCULAR | Status: DC
  Filled 2022-07-04: qty 1

## 2022-07-04 MED ORDER — ONDANSETRON HCL 4 MG/2ML IJ SOLN
4.0000 mg | Freq: Four times a day (QID) | INTRAMUSCULAR | Status: DC | PRN
  Administered 2022-07-05: 4 mg via INTRAVENOUS
  Filled 2022-07-04: qty 2

## 2022-07-04 MED ORDER — DIATRIZOATE MEGLUMINE & SODIUM 66-10 % PO SOLN
90.0000 mL | Freq: Once | ORAL | Status: AC
  Administered 2022-07-04: 90 mL via NASOGASTRIC
  Filled 2022-07-04 (×2): qty 90

## 2022-07-04 NOTE — Consult Note (Signed)
Reason for Consult:ab pain Referring Physician: Dr Starleen Arms Rubin Jason Stephenson is an 77 y.o. male.  HPI: 31 yom who has history of lap sbr for perforated jejunal diverticulitis in 5/23, history of dementia who presents with onset of ab pain overnight associated with n/v.  No fevers, no bm or flatus.  History obtained from wife.he arrives and has wbc of 12.6, nl lactate and ct with likely sbo (? Anastomotic).  I was asked to see him.  Past Medical History:  Diagnosis Date   Dementia ([place]) 10/08/2021    Past Surgical History:  Procedure Laterality Date   COLONOSCOPY     LAPAROSCOPIC SMALL BOWEL RESECTION N/A 10/10/2021   Procedure: LAPAROSCOPIC SMALL BOWEL RESECTION;  Surgeon: Michael Boston, MD;  Location: [facility];  Service: General;  Laterality: N/A;  bilateral tap blocks    LAPAROSCOPY  10/10/2021   Procedure: LAPAROSCOPY DIAGNOSTIC;  Surgeon: Michael Boston, MD;  Location: [facility];  Service: General;;   parathyroid surgery  04/21/2017   POLYPECTOMY      Family History  Problem Relation Age of Onset   Stroke Father    Colon cancer Neg Hx    Colon polyps Neg Hx    Esophageal cancer Neg Hx    Rectal cancer Neg Hx    Stomach cancer Neg Hx     Social History:  reports that he has quit smoking. He has never used smokeless tobacco. He reports current alcohol use. He reports that he does not use drugs.  Allergies:  Allergies  Allergen Reactions   Probiotic Product     No current facility-administered medications on file prior to encounter.   Current Outpatient Medications on File Prior to Encounter  Medication Sig Dispense Refill   Cholecalciferol (VITAMIN D3) 25 MCG (1000 UT) CAPS Take 1,000 Units by mouth daily.     diclofenac Sodium (VOLTAREN) 1 % GEL Apply 4 g topically 4 (four) times daily. Apply to affected areas 4 times daily as needed for pain. 100 g 2   divalproex (DEPAKOTE) 125 MG DR tablet Take 1 tablet (125 mg total) by mouth at bedtime. 30 tablet 3   donepezil  (ARICEPT) 10 MG tablet TAKE 1 TABLET BY MOUTH AT BEDTIME 30 tablet 0   finasteride (PROSCAR) 5 MG tablet Take 5 mg by mouth daily.     hydrocortisone cream 1 % Apply 1 Application topically 2 (two) times daily. 30 g 0   ibuprofen (ADVIL) 400 MG tablet Take 1 tablet (400 mg total) by mouth every 8 (eight) hours as needed for up to 30 doses. 30 tablet 0   memantine (NAMENDA) 10 MG tablet Take 1 tablet by mouth twice daily 60 tablet 3   Nutritional Supplements (VITAMIN D MAINTENANCE PO) Take 1,000 mg by mouth.     polyethylene glycol (MIRALAX / GLYCOLAX) 17 g packet Take 17 g by mouth 2 (two) times daily. 14 each 0   tadalafil (CIALIS) 5 MG tablet Take 5 mg by mouth daily.       Results for orders placed or performed during the hospital encounter of 07/04/22 (from the past 48 hour(s))  Comprehensive metabolic panel     Status: Abnormal   Collection Time: 07/04/22  7:09 AM  Result Value Ref Range   Sodium 140 135 - 145 mmol/L   Potassium 3.6 3.5 - 5.1 mmol/L   Chloride 105 98 - 111 mmol/L   CO2 24 22 - 32 mmol/L   Glucose, Bld 106 (H) 70 - 99 mg/dL  Comment: Glucose reference range applies only to samples taken after fasting for at least 8 hours.   BUN 21 8 - 23 mg/dL   Creatinine, Ser 1.23 0.61 - 1.24 mg/dL   Calcium 8.7 (L) 8.9 - 10.3 mg/dL   Total Protein 5.9 (L) 6.5 - 8.1 g/dL   Albumin 3.5 3.5 - 5.0 g/dL   AST 20 15 - 41 U/L   ALT 13 0 - 44 U/L   Alkaline Phosphatase 65 38 - 126 U/L   Total Bilirubin 0.9 0.3 - 1.2 mg/dL   GFR, Estimated >60 >60 mL/min    Comment: (NOTE) Calculated using the CKD-EPI Creatinine Equation (2021)    Anion gap 11 5 - 15    Comment: Performed at [facility], [place] [address], [place], [place] [address]  Lipase, blood     Status: None   Collection Time: 07/04/22  7:09 AM  Result Value Ref Range   Lipase 41 11 - 51 U/L    Comment: Performed at [facility], [place] [address], [place], [place] [address]  CBC with  Differential     Status: Abnormal   Collection Time: 07/04/22  7:09 AM  Result Value Ref Range   WBC 12.6 (H) 4.0 - 10.5 K/uL   RBC 5.04 4.22 - 5.81 MIL/uL   Hemoglobin 15.9 13.0 - 17.0 g/dL   HCT 46.3 39.0 - 52.0 %   MCV 91.9 80.0 - 100.0 fL   MCH 31.5 26.0 - 34.0 pg   MCHC 34.3 30.0 - 36.0 g/dL   RDW 12.0 11.5 - 15.5 %   Platelets 245 150 - 400 K/uL   nRBC 0.0 0.0 - 0.2 %   Neutrophils Relative % 88 %   Neutro Abs 11.0 (H) 1.7 - 7.7 K/uL   Lymphocytes Relative 7 %   Lymphs Abs 0.9 0.7 - 4.0 K/uL   Monocytes Relative 4 %   Monocytes Absolute 0.6 0.1 - 1.0 K/uL   Eosinophils Relative 1 %   Eosinophils Absolute 0.1 0.0 - 0.5 K/uL   Basophils Relative 0 %   Basophils Absolute 0.0 0.0 - 0.1 K/uL   Immature Granulocytes 0 %   Abs Immature Granulocytes 0.04 0.00 - 0.07 K/uL    Comment: Performed at [facility], [place] [address] [place], [place] [address]  Lactic acid, plasma     Status: None   Collection Time: 07/04/22  7:40 AM  Result Value Ref Range   Lactic Acid, Venous 1.3 0.5 - 1.9 mmol/L    Comment: Performed at [facility], [place] [address] [place], [place] [address]  Urinalysis, Routine w reflex microscopic -Urine, Clean Catch     Status: None   Collection Time: 07/04/22  7:40 AM  Result Value Ref Range   Color, Urine YELLOW YELLOW   APPearance CLEAR CLEAR   Specific Gravity, Urine 1.023 1.005 - 1.030   pH 7.0 5.0 - 8.0   Glucose, UA NEGATIVE NEGATIVE mg/dL   Hgb urine dipstick NEGATIVE NEGATIVE   Bilirubin Urine NEGATIVE NEGATIVE   Ketones, ur NEGATIVE NEGATIVE mg/dL   Protein, ur NEGATIVE NEGATIVE mg/dL   Nitrite NEGATIVE NEGATIVE   Leukocytes,Ua NEGATIVE NEGATIVE    Comment: Performed at [facility], [place] [address] [place], [place] [address]  Lactic acid, plasma     Status: None   Collection Time: 07/04/22 11:49 AM  Result Value Ref Range   Lactic Acid, Venous 0.9 0.5 - 1.9 mmol/L  Comment: Performed  at [facility], [place] [address], [place], [place] [address]    CT Abdomen Pelvis W Contrast  Result Date: 07/04/2022 CLINICAL DATA:  Abdominal pain EXAM: CT ABDOMEN AND PELVIS WITH CONTRAST TECHNIQUE: Multidetector CT imaging of the abdomen and pelvis was performed using the standard protocol following bolus administration of intravenous contrast. RADIATION DOSE REDUCTION: This exam was performed according to the departmental dose-optimization program which includes automated exposure control, adjustment of the mA and/or kV according to patient size and/or use of iterative reconstruction technique. CONTRAST:  120m OMNIPAQUE IOHEXOL 300 MG/ML  SOLN COMPARISON:  10/08/2021 FINDINGS: Lower chest: Atelectasis is identified within the posterior lung bases. No pleural fluid or airspace consolidation. Hepatobiliary: No focal liver lesion identified. Status post cholecystectomy. Mild intrahepatic bile duct dilatation and common bile duct dilatation noted. The CBD measures up to 1.1 cm, image 35/4. Previously 1 cm. Pancreas: Unremarkable. No pancreatic ductal dilatation or surrounding inflammatory changes. Spleen: Normal in size without focal abnormality. Adrenals/Urinary Tract: Normal adrenal glands. No hydronephrosis, nephrolithiasis or suspicious kidney mass. Urinary bladder appears normal. The Stomach/Bowel: Small hiatal hernia. Beyond the level of the ligament of Treitz there is abnormal small bowel dilatation with multiple air-fluid levels. Small bowel loops measure up to 4.7 cm in diameter, image 19/4. Signs of interval small-bowel resection with entero enteric anastomosis noted in the right upper quadrant of the abdomen. Adjacent to the anastomotic suture line there is a short segment of abnormal small bowel wall thickening with surrounding soft tissue stranding, image 46/2. The distal small bowel loops are nondilated. Imaging findings are concerning for at small bowel obstruction. The exact  transition point is difficult to identified with a high degree of certainty. Normal caliber of the colon. Moderate stool burden is noted within the colon up to the level of the rectum. Sigmoid diverticulosis without signs of acute diverticulitis. Vascular/Lymphatic: Aortic atherosclerosis. No aneurysm. No signs of abdominopelvic adenopathy. Reproductive: Mild prostate gland enlargement. Other: Small volume of ascites identified within the right upper quadrant of the abdomen extending over left for and in the dependent portion of the pelvis. No discrete fluid collections identified. No signs of pneumoperitoneum. Musculoskeletal: Spondylosis identified within the thoracic spine. 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 Anterolisthesis of L5 on S1 measures 8 mm. Multilevel lumbar degenerative disc disease. IMPRESSION: 1. Imaging findings concerning for small bowel obstruction. There is abnormal dilatation of the proximal and mid small bowel loops with decreased caliber distal small bowel. Multiple air-fluid levels are identified. Exact transition point is difficult to identify with a high degree of certainty. 2. Signs of small bowel resection with anastomotic suture chain identified in the right upper quadrant of the abdomen. Bowel loop adjacent to the anastomosis exhibits abnormal wall thickening concerning for focal enteritis. 3. Small volume of ascites. No free air or focal fluid collections identified. 4. Status post cholecystectomy with increase in caliber of the common bile duct and intrahepatic bile ducts. 5. Moderate stool burden noted within the colon up to the level of the rectum. 6. Aortic Atherosclerosis (ICD10-I70.0). Electronically Signed   By: TKerby MoorsM.D.   On: 07/04/2022 09:40    Review of Systems  Unable to perform ROS: Dementia   Blood pressure 112/63, pulse (!) 58, temperature 97.7 F (36.5 C), temperature source Oral, resp. rate 16, height 5' 10"$  (1.778 m), weight 69.4 kg, SpO2 97  %. Physical Exam Vitals reviewed.  Constitutional:      Appearance: He is well-developed.  Eyes:  General: No scleral icterus. Cardiovascular:     Rate and Rhythm: Normal rate and regular rhythm.  Pulmonary:     Effort: Pulmonary effort is normal.  Abdominal:     General: Bowel sounds are normal. There is distension (mild).     Tenderness: There is no abdominal tenderness.     Hernia: No hernia is present.  Skin:    General: Skin is warm and dry.  Neurological:     Mental Status: He is alert.     Assessment/Plan: SBO - no indication for surgery at this time, hopefully resolves conservatively -ideally ng tube drainage and protocol, had issues last time pulling this so restraints are reasonable.   -will follow with you -discussed plan with wife  I reviewed ED provider notes, last 24 h vitals and pain scores, last 24 h labs and trends, and last 24 h imaging results.I reviewed ct with dilated sb and no real clear transition and discussed with er provider Dr Johnney Killian who I recommended place ng tube  This care required moderate level of medical decision making.    Rolm Bookbinder 07/04/2022, 1:17 PM

## 2022-07-04 NOTE — H&P (Signed)
History and Physical    Jason Stephenson  [id number]  DOB: [birth year]  DOA: 07/04/2022 PCP: Faustino Congress, NP   Patient coming from: Home  Chief Complaint: Abdominal pain  HPI: Jason Stephenson is a 77 y.o. male with medical history of dementia and a perforated diverticulitis in the judging him on 5/23 who underwent a laparoscopic surgery presents with acute onset abdominal pain starting last night. His dementia prevents him from giving a history and his wife is only able to tell us he was doubled over in pain and appeared to be in more pain than he was the last time he had a SBO. In the ED he is found to have small bowel obstruction with out a transition point that is difficult to identify. The patient has no complaints.   ED Course: NG placed  Review of Systems: unable to obtain  Past Medical History:  Diagnosis Date   Dementia ([place]) 10/08/2021    Past Surgical History:  Procedure Laterality Date   COLONOSCOPY     LAPAROSCOPIC SMALL BOWEL RESECTION N/A 10/10/2021   Procedure: LAPAROSCOPIC SMALL BOWEL RESECTION;  Surgeon: Michael Boston, MD;  Location: [facility];  Service: General;  Laterality: N/A;  bilateral tap blocks    LAPAROSCOPY  10/10/2021   Procedure: LAPAROSCOPY DIAGNOSTIC;  Surgeon: Michael Boston, MD;  Location: [facility];  Service: General;;   parathyroid surgery  04/21/2017   POLYPECTOMY      Social History:   reports that he has quit smoking. He has never used smokeless tobacco. He reports current alcohol use. He reports that he does not use drugs.  No Active Allergies  Family History  Problem Relation Age of Onset   Stroke Father    Colon cancer Neg Hx    Colon polyps Neg Hx    Esophageal cancer Neg Hx    Rectal cancer Neg Hx    Stomach cancer Neg Hx      Prior to Admission medications   Medication Sig Start Date End Date Taking? Authorizing Provider  Cholecalciferol (VITAMIN D3) 25 MCG (1000 UT) CAPS Take 1,000 Units by mouth daily.    Yes [provider]  donepezil (ARICEPT) 10 MG tablet TAKE 1 TABLET BY MOUTH AT BEDTIME Patient taking differently: Take 10 mg by mouth daily. 06/09/22  Yes Rondel Jumbo, PA-C  finasteride (PROSCAR) 5 MG tablet Take 5 mg by mouth daily.   Yes [provider]  memantine (NAMENDA) 10 MG tablet Take 1 tablet by mouth twice daily 06/02/22  Yes Wertman, Coralee Pesa, PA-C  polyethylene glycol (MIRALAX / GLYCOLAX) 17 g packet Take 17 g by mouth 2 (two) times daily. Patient taking differently: Take 17 g by mouth daily. 10/16/21  Yes Hongalgi, Lenis Dickinson, MD  SEROQUEL 25 MG tablet Take 25 mg by mouth at bedtime. 06/08/22  Yes [provider]  tadalafil (CIALIS) 5 MG tablet Take 5 mg by mouth daily.   Yes [provider]  diclofenac Sodium (VOLTAREN) 1 % GEL Apply 4 g topically 4 (four) times daily. Apply to affected areas 4 times daily as needed for pain. Patient not taking: Reported on 07/04/2022 05/16/22   Lynden Oxford Scales, PA-C  divalproex (DEPAKOTE) 125 MG DR tablet Take 1 tablet (125 mg total) by mouth at bedtime. Patient not taking: Reported on 07/04/2022 05/27/22   Rondel Jumbo, PA-C  hydrocortisone cream 1 % Apply 1 Application topically 2 (two) times daily. Patient not taking: Reported on 07/04/2022 12/13/21   Lilia Pro,  Lindsay Scales, PA-C  ibuprofen (ADVIL) 400 MG tablet Take 1 tablet (400 mg total) by mouth every 8 (eight) hours as needed for up to 30 doses. Patient not taking: Reported on 07/04/2022 05/16/22   Lynden Oxford Scales, PA-C    Physical Exam: Wt Readings from Last 3 Encounters:  07/04/22 69.4 kg  05/15/22 69.4 kg  10/31/21 67.1 kg   Vitals:   07/04/22 1130 07/04/22 1206 07/04/22 1230 07/04/22 1502  BP: 126/63  112/63 138/66  Pulse: 63  (!) 58 (!) 59  Resp: 16  16 18  $ Temp:  97.7 F (36.5 C)  (!) 97.5 F (36.4 C)  TempSrc:  Oral  Oral  SpO2: 96%  97% 98%  Weight:      Height:          Constitutional:  Calm & comfortable Eyes:  PERRLA, lids and conjunctivae normal ENT: NG tube present Mucous membranes are moist.  Pharynx clear of exudate   Normal dentition.  Respiratory:  Clear to auscultation bilaterally  Normal respiratory effort.  Cardiovascular:  S1 & S2 heard, regular rate and rhythm No Murmurs Abdomen:  Non distended No tenderness, No masses No bowel sounds Extremities:  No clubbing / cyanosis No pedal edema  Skin:  No rashes, lesions or ulcers Neurologic:  AAO x 3 CN 2-12 grossly intact Sensation intact Strength 5/5 in all 4 extremities Psychiatric:  Normal Mood and affect    Labs on Admission: I have personally reviewed following labs and imaging studies  CBC: Recent Labs  Lab 07/04/22 0709  WBC 12.6*  NEUTROABS 11.0*  HGB 15.9  HCT 46.3  MCV 91.9  PLT 99991111   Basic Metabolic Panel: Recent Labs  Lab 07/04/22 0709  NA 140  K 3.6  CL 105  CO2 24  GLUCOSE 106*  BUN 21  CREATININE 1.23  CALCIUM 8.7*   GFR: Estimated Creatinine Clearance: 50.2 mL/min (by C-G formula based on SCr of 1.23 mg/dL). Liver Function Tests: Recent Labs  Lab 07/04/22 0709  AST 20  ALT 13  ALKPHOS 65  BILITOT 0.9  PROT 5.9*  ALBUMIN 3.5   Recent Labs  Lab 07/04/22 0709  LIPASE 41   No results for input(s): "AMMONIA" in the last 168 hours. Coagulation Profile: No results for input(s): "INR", "PROTIME" in the last 168 hours. Cardiac Enzymes: No results for input(s): "CKTOTAL", "CKMB", "CKMBINDEX", "TROPONINI" in the last 168 hours. BNP (last 3 results) No results for input(s): "PROBNP" in the last 8760 hours. HbA1C: No results for input(s): "HGBA1C" in the last 72 hours. CBG: No results for input(s): "GLUCAP" in the last 168 hours. Lipid Profile: No results for input(s): "CHOL", "HDL", "LDLCALC", "TRIG", "CHOLHDL", "LDLDIRECT" in the last 72 hours. Thyroid Function Tests: No results for input(s): "TSH", "T4TOTAL", "FREET4", "T3FREE", "THYROIDAB" in the last 72 hours. Anemia  Panel: No results for input(s): "VITAMINB12", "FOLATE", "FERRITIN", "TIBC", "IRON", "RETICCTPCT" in the last 72 hours. Urine analysis:    Component Value Date/Time   COLORURINE YELLOW 07/04/2022 0740   APPEARANCEUR CLEAR 07/04/2022 0740   LABSPEC 1.023 07/04/2022 0740   PHURINE 7.0 07/04/2022 0740   GLUCOSEU NEGATIVE 07/04/2022 0740   HGBUR NEGATIVE 07/04/2022 0740   BILIRUBINUR NEGATIVE 07/04/2022 0740   KETONESUR NEGATIVE 07/04/2022 0740   PROTEINUR NEGATIVE 07/04/2022 0740   NITRITE NEGATIVE 07/04/2022 0740   LEUKOCYTESUR NEGATIVE 07/04/2022 0740   Sepsis Labs: @LABRCNTIP$ (procalcitonin:4,lacticidven:4) )No results found for this or any previous visit (from the past 240 hour(s)).   Radiological Exams on  Admission: DG Abd Portable 1V-Small Bowel Protocol-Position Verification  Result Date: 07/04/2022 CLINICAL DATA:  NGT placement. EXAM: PORTABLE ABDOMEN - 1 VIEW COMPARISON:  10/11/2021. FINDINGS: Image of the upper abdomen demonstrates NG tube tip superimposed with the stomach. Side port may be in the distal esophagus and the tube should be advanced about 7 cm. IMPRESSION: Side port may be in the distal esophagus, and the tube should be advanced about 7 cm. Electronically Signed   By: Sammie Bench M.D.   On: 07/04/2022 14:05   CT Abdomen Pelvis W Contrast  Result Date: 07/04/2022 CLINICAL DATA:  Abdominal pain EXAM: CT ABDOMEN AND PELVIS WITH CONTRAST TECHNIQUE: Multidetector CT imaging of the abdomen and pelvis was performed using the standard protocol following bolus administration of intravenous contrast. RADIATION DOSE REDUCTION: This exam was performed according to the departmental dose-optimization program which includes automated exposure control, adjustment of the mA and/or kV according to patient size and/or use of iterative reconstruction technique. CONTRAST:  123m OMNIPAQUE IOHEXOL 300 MG/ML  SOLN COMPARISON:  10/08/2021 FINDINGS: Lower chest: Atelectasis is identified  within the posterior lung bases. No pleural fluid or airspace consolidation. Hepatobiliary: No focal liver lesion identified. Status post cholecystectomy. Mild intrahepatic bile duct dilatation and common bile duct dilatation noted. The CBD measures up to 1.1 cm, image 35/4. Previously 1 cm. Pancreas: Unremarkable. No pancreatic ductal dilatation or surrounding inflammatory changes. Spleen: Normal in size without focal abnormality. Adrenals/Urinary Tract: Normal adrenal glands. No hydronephrosis, nephrolithiasis or suspicious kidney mass. Urinary bladder appears normal. The Stomach/Bowel: Small hiatal hernia. Beyond the level of the ligament of Treitz there is abnormal small bowel dilatation with multiple air-fluid levels. Small bowel loops measure up to 4.7 cm in diameter, image 19/4. Signs of interval small-bowel resection with entero enteric anastomosis noted in the right upper quadrant of the abdomen. Adjacent to the anastomotic suture line there is a short segment of abnormal small bowel wall thickening with surrounding soft tissue stranding, image 46/2. The distal small bowel loops are nondilated. Imaging findings are concerning for at small bowel obstruction. The exact transition point is difficult to identified with a high degree of certainty. Normal caliber of the colon. Moderate stool burden is noted within the colon up to the level of the rectum. Sigmoid diverticulosis without signs of acute diverticulitis. Vascular/Lymphatic: Aortic atherosclerosis. No aneurysm. No signs of abdominopelvic adenopathy. Reproductive: Mild prostate gland enlargement. Other: Small volume of ascites identified within the right upper quadrant of the abdomen extending over left for and in the dependent portion of the pelvis. No discrete fluid collections identified. No signs of pneumoperitoneum. Musculoskeletal: Spondylosis identified within the thoracic spine. 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 Anterolisthesis of L5 on S1  measures 8 mm. Multilevel lumbar degenerative disc disease. IMPRESSION: 1. Imaging findings concerning for small bowel obstruction. There is abnormal dilatation of the proximal and mid small bowel loops with decreased caliber distal small bowel. Multiple air-fluid levels are identified. Exact transition point is difficult to identify with a high degree of certainty. 2. Signs of small bowel resection with anastomotic suture chain identified in the right upper quadrant of the abdomen. Bowel loop adjacent to the anastomosis exhibits abnormal wall thickening concerning for focal enteritis. 3. Small volume of ascites. No free air or focal fluid collections identified. 4. Status post cholecystectomy with increase in caliber of the common bile duct and intrahepatic bile ducts. 5. Moderate stool burden noted within the colon up to the level of the rectum. 6. Aortic Atherosclerosis (ICD10-I70.0).  Electronically Signed   By: Kerby Moors M.D.   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

## 2022-07-04 NOTE — ED Notes (Signed)
ED TO INPATIENT HANDOFF REPORT  ED Nurse Name and Phone #: Waynette Buttery Name/Age/Gender Jason Stephenson 77 y.o. male Room/Bed: WA09/WA09  Code Status   Code Status: Prior  Home/SNF/Other Home Patient oriented to: self, place, and situation Is this baseline? Yes   Triage Complete: Triage complete  Chief Complaint SBO (small bowel obstruction) ([place]) [K56.609]  Triage Note No notes on file   Allergies Allergies  Allergen Reactions   Probiotic Product     Level of Care/Admitting Diagnosis ED Disposition     ED Disposition  Admit   Condition  --   [place]: [place] [id number]  Level of Care: Med-Surg [16]  May admit patient to [facility] or [facility] if equivalent level of care is available:: Yes  Covid Evaluation: Asymptomatic - no recent exposure (last 10 days) testing not required  Diagnosis: SBO (small bowel obstruction) [facility]) [id number]  Admitting Physician: Ericson, [place]  Attending Physician: Debbe Odea [id number]  Certification:: I certify this patient will need inpatient services for at least 2 midnights  Estimated Length of Stay: 3          B Medical/Surgery History Past Medical History:  Diagnosis Date   Dementia ([place]) 10/08/2021   Past Surgical History:  Procedure Laterality Date   COLONOSCOPY     LAPAROSCOPIC SMALL BOWEL RESECTION N/A 10/10/2021   Procedure: LAPAROSCOPIC SMALL BOWEL RESECTION;  Surgeon: Michael Boston, MD;  Location: [facility];  Service: General;  Laterality: N/A;  bilateral tap blocks    LAPAROSCOPY  10/10/2021   Procedure: LAPAROSCOPY DIAGNOSTIC;  Surgeon: Michael Boston, MD;  Location: [facility];  Service: General;;   parathyroid surgery  04/21/2017   POLYPECTOMY       A IV Location/Drains/Wounds Patient Lines/Drains/Airways Status     Active Line/Drains/Airways     Name Placement date Placement time Site Days   Peripheral IV 07/04/22 20 G 1" Right Antecubital 07/04/22   0721  Antecubital  less than 1   External Urinary Catheter 10/11/21  --  --  266   Incision - 3 Ports Abdomen 1: Right;Lateral;Upper 2: Right;Medial;Lateral 3: Right;Lateral;Lower 10/10/21  --  -- 267            Intake/Output Last 24 hours No intake or output data in the 24 hours ending 07/04/22 1224  Labs/Imaging Results for orders placed or performed during the hospital encounter of 07/04/22 (from the past 48 hour(s))  Comprehensive metabolic panel     Status: Abnormal   Collection Time: 07/04/22  7:09 AM  Result Value Ref Range   Sodium 140 135 - 145 mmol/L   Potassium 3.6 3.5 - 5.1 mmol/L   Chloride 105 98 - 111 mmol/L   CO2 24 22 - 32 mmol/L   Glucose, Bld 106 (H) 70 - 99 mg/dL    Comment: Glucose reference range applies only to samples taken after fasting for at least 8 hours.   BUN 21 8 - 23 mg/dL   Creatinine, Ser 1.23 0.61 - 1.24 mg/dL   Calcium 8.7 (L) 8.9 - 10.3 mg/dL   Total Protein 5.9 (L) 6.5 - 8.1 g/dL   Albumin 3.5 3.5 - 5.0 g/dL   AST 20 15 - 41 U/L   ALT 13 0 - 44 U/L   Alkaline Phosphatase 65 38 - 126 U/L   Total Bilirubin 0.9 0.3 - 1.2 mg/dL   GFR, Estimated >60 >60 mL/min    Comment: (NOTE) Calculated using the CKD-EPI  Creatinine Equation (2021)    Anion gap 11 5 - 15    Comment: Performed at [facility], [place] [address], [place], [place] [address]  Lipase, blood     Status: None   Collection Time: 07/04/22  7:09 AM  Result Value Ref Range   Lipase 41 11 - 51 U/L    Comment: Performed at [facility], [place] [address] [place], [place] [address]  CBC with Differential     Status: Abnormal   Collection Time: 07/04/22  7:09 AM  Result Value Ref Range   WBC 12.6 (H) 4.0 - 10.5 K/uL   RBC 5.04 4.22 - 5.81 MIL/uL   Hemoglobin 15.9 13.0 - 17.0 g/dL   HCT 46.3 39.0 - 52.0 %   MCV 91.9 80.0 - 100.0 fL   MCH 31.5 26.0 - 34.0 pg   MCHC 34.3 30.0 - 36.0 g/dL   RDW 12.0 11.5 - 15.5 %   Platelets 245 150 - 400 K/uL    nRBC 0.0 0.0 - 0.2 %   Neutrophils Relative % 88 %   Neutro Abs 11.0 (H) 1.7 - 7.7 K/uL   Lymphocytes Relative 7 %   Lymphs Abs 0.9 0.7 - 4.0 K/uL   Monocytes Relative 4 %   Monocytes Absolute 0.6 0.1 - 1.0 K/uL   Eosinophils Relative 1 %   Eosinophils Absolute 0.1 0.0 - 0.5 K/uL   Basophils Relative 0 %   Basophils Absolute 0.0 0.0 - 0.1 K/uL   Immature Granulocytes 0 %   Abs Immature Granulocytes 0.04 0.00 - 0.07 K/uL    Comment: Performed at [facility], [place] [address] [place], [place] [address]  Lactic acid, plasma     Status: None   Collection Time: 07/04/22  7:40 AM  Result Value Ref Range   Lactic Acid, Venous 1.3 0.5 - 1.9 mmol/L    Comment: Performed at [facility], [place] [address] [place], [place] [address]  Urinalysis, Routine w reflex microscopic -Urine, Clean Catch     Status: None   Collection Time: 07/04/22  7:40 AM  Result Value Ref Range   Color, Urine YELLOW YELLOW   APPearance CLEAR CLEAR   Specific Gravity, Urine 1.023 1.005 - 1.030   pH 7.0 5.0 - 8.0   Glucose, UA NEGATIVE NEGATIVE mg/dL   Hgb urine dipstick NEGATIVE NEGATIVE   Bilirubin Urine NEGATIVE NEGATIVE   Ketones, ur NEGATIVE NEGATIVE mg/dL   Protein, ur NEGATIVE NEGATIVE mg/dL   Nitrite NEGATIVE NEGATIVE   Leukocytes,Ua NEGATIVE NEGATIVE    Comment: Performed at [facility], [place] [address], [place], [place] [address]   CT Abdomen Pelvis W Contrast  Result Date: 07/04/2022 CLINICAL DATA:  Abdominal pain EXAM: CT ABDOMEN AND PELVIS WITH CONTRAST TECHNIQUE: Multidetector CT imaging of the abdomen and pelvis was performed using the standard protocol following bolus administration of intravenous contrast. RADIATION DOSE REDUCTION: This exam was performed according to the departmental dose-optimization program which includes automated exposure control, adjustment of the mA and/or kV according to patient size and/or use of iterative reconstruction  technique. CONTRAST:  165m OMNIPAQUE IOHEXOL 300 MG/ML  SOLN COMPARISON:  10/08/2021 FINDINGS: Lower chest: Atelectasis is identified within the posterior lung bases. No pleural fluid or airspace consolidation. Hepatobiliary: No focal liver lesion identified. Status post cholecystectomy. Mild intrahepatic bile duct dilatation and common bile duct dilatation noted. The CBD measures up to 1.1 cm, image 35/4. Previously 1 cm. Pancreas: Unremarkable. No  pancreatic ductal dilatation or surrounding inflammatory changes. Spleen: Normal in size without focal abnormality. Adrenals/Urinary Tract: Normal adrenal glands. No hydronephrosis, nephrolithiasis or suspicious kidney mass. Urinary bladder appears normal. The Stomach/Bowel: Small hiatal hernia. Beyond the level of the ligament of Treitz there is abnormal small bowel dilatation with multiple air-fluid levels. Small bowel loops measure up to 4.7 cm in diameter, image 19/4. Signs of interval small-bowel resection with entero enteric anastomosis noted in the right upper quadrant of the abdomen. Adjacent to the anastomotic suture line there is a short segment of abnormal small bowel wall thickening with surrounding soft tissue stranding, image 46/2. The distal small bowel loops are nondilated. Imaging findings are concerning for at small bowel obstruction. The exact transition point is difficult to identified with a high degree of certainty. Normal caliber of the colon. Moderate stool burden is noted within the colon up to the level of the rectum. Sigmoid diverticulosis without signs of acute diverticulitis. Vascular/Lymphatic: Aortic atherosclerosis. No aneurysm. No signs of abdominopelvic adenopathy. Reproductive: Mild prostate gland enlargement. Other: Small volume of ascites identified within the right upper quadrant of the abdomen extending over left for and in the dependent portion of the pelvis. No discrete fluid collections identified. No signs of pneumoperitoneum.  Musculoskeletal: Spondylosis identified within the thoracic spine. 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 Anterolisthesis of L5 on S1 measures 8 mm. Multilevel lumbar degenerative disc disease. IMPRESSION: 1. Imaging findings concerning for small bowel obstruction. There is abnormal dilatation of the proximal and mid small bowel loops with decreased caliber distal small bowel. Multiple air-fluid levels are identified. Exact transition point is difficult to identify with a high degree of certainty. 2. Signs of small bowel resection with anastomotic suture chain identified in the right upper quadrant of the abdomen. Bowel loop adjacent to the anastomosis exhibits abnormal wall thickening concerning for focal enteritis. 3. Small volume of ascites. No free air or focal fluid collections identified. 4. Status post cholecystectomy with increase in caliber of the common bile duct and intrahepatic bile ducts. 5. Moderate stool burden noted within the colon up to the level of the rectum. 6. Aortic Atherosclerosis (ICD10-I70.0). Electronically Signed   By: Kerby Moors M.D.   On: 07/04/2022 09:40    Pending Labs Unresulted Labs (From admission, onward)     Start     Ordered   07/04/22 0709  Lactic acid, plasma  Now then every 2 hours,   R (with STAT occurrences)      07/04/22 0709            Vitals/Pain Today's Vitals   07/04/22 0659 07/04/22 0841 07/04/22 1130 07/04/22 1206  BP: (!) 144/86  126/63   Pulse: 60  63   Resp:   16   Temp: 97.8 F (36.6 C)   97.7 F (36.5 C)  TempSrc: Oral   Oral  SpO2: 94%  96%   Weight:      Height:      PainSc:  Asleep      Isolation Precautions No active isolations  Medications Medications  valproate (DEPACON) 125 mg in dextrose 5 % 50 mL IVPB (has no administration in time range)  HYDROmorphone (DILAUDID) injection 1 mg (1 mg Intravenous Given 07/04/22 0733)  ondansetron (ZOFRAN) injection 4 mg (4 mg Intravenous Given 07/04/22 0733)  sodium chloride 0.9 %  bolus 500 mL (0 mLs Intravenous Stopped 07/04/22 1011)  iohexol (OMNIPAQUE) 300 MG/ML solution 100 mL (100 mLs Intravenous Contrast Given 07/04/22 0846)  0.9 %  sodium chloride infusion ( Intravenous New Bag/Given 07/04/22 1017)  HYDROmorphone (DILAUDID) injection 0.5 mg (0.5 mg Intravenous Given 07/04/22 1113)    Mobility walks     Focused Assessments Cardiac Assessment Handoff:    No results found for: "CKTOTAL", "CKMB", "CKMBINDEX", "TROPONINI" No results found for: "DDIMER" Does the Patient currently have chest pain? No    R Recommendations: See Admitting Provider Note  Report given to:   Additional Notes:

## 2022-07-04 NOTE — ED Provider Notes (Signed)
[facility] Provider Note   CSN: [id number] Arrival date & time: 07/04/22  [id number]     History  Chief Complaint  Patient presents with   Abdominal Pain    Pt in with c/o generalized abd pain and n/v starting this am. Wife states pt woke from sleep screaming in pain and pointing to abd. History of a bowel obstruction in May 23. Takes miralax daily.Pt does have progressing dementia but is able to communicate and follow commands. 10/10 pain.     Jason Stephenson is a 77 y.o. male.  HPI Patient had acute onset of pain during the night.  He has a prior history of a bowel obstruction.  He had a normal dinner last night.  He ate well with no problems.  He awakened with sudden onset of vomiting and generalized central abdominal pain.  No symptoms leading up to this.  As far as his wife and the patient reports no pain burning urgency with urination, no constipation, no fevers no chills.    Home Medications Prior to Admission medications   Medication Sig Start Date End Date Taking? Authorizing Provider  Cholecalciferol (VITAMIN D3) 25 MCG (1000 UT) CAPS Take 1,000 Units by mouth daily.    [provider]  diclofenac Sodium (VOLTAREN) 1 % GEL Apply 4 g topically 4 (four) times daily. Apply to affected areas 4 times daily as needed for pain. 05/16/22   Lynden Oxford Scales, PA-C  divalproex (DEPAKOTE) 125 MG DR tablet Take 1 tablet (125 mg total) by mouth at bedtime. 05/27/22   Rondel Jumbo, PA-C  donepezil (ARICEPT) 10 MG tablet TAKE 1 TABLET BY MOUTH AT BEDTIME 06/09/22   Rondel Jumbo, PA-C  finasteride (PROSCAR) 5 MG tablet Take 5 mg by mouth daily.    [provider]  hydrocortisone cream 1 % Apply 1 Application topically 2 (two) times daily. 12/13/21   Lynden Oxford Scales, PA-C  ibuprofen (ADVIL) 400 MG tablet Take 1 tablet (400 mg total) by mouth every 8 (eight) hours as needed for up to 30 doses. 05/16/22   Lynden Oxford Scales, PA-C  memantine [facility]) 10 MG tablet Take 1 tablet by mouth twice daily 06/02/22   Rondel Jumbo, PA-C  Nutritional Supplements (VITAMIN D MAINTENANCE PO) Take 1,000 mg by mouth.    [provider]  polyethylene glycol (MIRALAX / GLYCOLAX) 17 g packet Take 17 g by mouth 2 (two) times daily. 10/16/21   Hongalgi, Lenis Dickinson, MD  tadalafil (CIALIS) 5 MG tablet Take 5 mg by mouth daily.    [provider]      Allergies    Probiotic product    Review of Systems   Review of Systems  Physical Exam Updated Vital Signs BP 112/63   Pulse (!) 58   Temp 97.7 F (36.5 C) (Oral)   Resp 16   Ht 5' 10"$  (1.778 m)   Wt 69.4 kg   SpO2 97%   BMI 21.95 kg/m  Physical Exam Constitutional:      Comments: Alert nontoxic.  No respiratory distress.  Well-nourished well-developed.  HENT:     Mouth/Throat:     Pharynx: Oropharynx is clear.  Eyes:     Extraocular Movements: Extraocular movements intact.  Cardiovascular:     Rate and Rhythm: Normal rate and regular rhythm.  Pulmonary:     Effort: Pulmonary effort is normal.     Breath sounds: Normal breath sounds.  Abdominal:  Comments: Abdomen nondistended.  Soft.  Generalized abdominal wall pain to the central and mid abdomen.  Musculoskeletal:        General: No swelling or tenderness. Normal range of motion.     Right lower leg: No edema.     Left lower leg: No edema.  Skin:    General: Skin is warm and dry.  Neurological:     Comments: Patient is alert.  His speech reflects cognitive impairment.  Speech is clear.  Movements are coordinated and purposeful.  No focal motor deficits.  Patient is trying to describe pain but conversation also includes commentary  regarding something "over there" in the corner of the room and the sentence structure is at times nonsensical     ED Results / Procedures / Treatments   Labs (all labs ordered are listed, but only abnormal results are displayed) Labs Reviewed   COMPREHENSIVE METABOLIC PANEL - Abnormal; Notable for the following components:      Result Value   Glucose, Bld 106 (*)    Calcium 8.7 (*)    Total Protein 5.9 (*)    All other components within normal limits  CBC WITH DIFFERENTIAL/PLATELET - Abnormal; Notable for the following components:   WBC 12.6 (*)    Neutro Abs 11.0 (*)    All other components within normal limits  LIPASE, BLOOD  LACTIC ACID, PLASMA  LACTIC ACID, PLASMA  URINALYSIS, ROUTINE W REFLEX MICROSCOPIC    EKG None  Radiology CT Abdomen Pelvis W Contrast  Result Date: 07/04/2022 CLINICAL DATA:  Abdominal pain EXAM: CT ABDOMEN AND PELVIS WITH CONTRAST TECHNIQUE: Multidetector CT imaging of the abdomen and pelvis was performed using the standard protocol following bolus administration of intravenous contrast. RADIATION DOSE REDUCTION: This exam was performed according to the departmental dose-optimization program which includes automated exposure control, adjustment of the mA and/or kV according to patient size and/or use of iterative reconstruction technique. CONTRAST:  179m OMNIPAQUE IOHEXOL 300 MG/ML  SOLN COMPARISON:  10/08/2021 FINDINGS: Lower chest: Atelectasis is identified within the posterior lung bases. No pleural fluid or airspace consolidation. Hepatobiliary: No focal liver lesion identified. Status post cholecystectomy. Mild intrahepatic bile duct dilatation and common bile duct dilatation noted. The CBD measures up to 1.1 cm, image 35/4. Previously 1 cm. Pancreas: Unremarkable. No pancreatic ductal dilatation or surrounding inflammatory changes. Spleen: Normal in size without focal abnormality. Adrenals/Urinary Tract: Normal adrenal glands. No hydronephrosis, nephrolithiasis or suspicious kidney mass. Urinary bladder appears normal. The Stomach/Bowel: Small hiatal hernia. Beyond the level of the ligament of Treitz there is abnormal small bowel dilatation with multiple air-fluid levels. Small bowel loops measure up  to 4.7 cm in diameter, image 19/4. Signs of interval small-bowel resection with entero enteric anastomosis noted in the right upper quadrant of the abdomen. Adjacent to the anastomotic suture line there is a short segment of abnormal small bowel wall thickening with surrounding soft tissue stranding, image 46/2. The distal small bowel loops are nondilated. Imaging findings are concerning for at small bowel obstruction. The exact transition point is difficult to identified with a high degree of certainty. Normal caliber of the colon. Moderate stool burden is noted within the colon up to the level of the rectum. Sigmoid diverticulosis without signs of acute diverticulitis. Vascular/Lymphatic: Aortic atherosclerosis. No aneurysm. No signs of abdominopelvic adenopathy. Reproductive: Mild prostate gland enlargement. Other: Small volume of ascites identified within the right upper quadrant of the abdomen extending over left for and in the dependent portion of the pelvis.  No discrete fluid collections identified. No signs of pneumoperitoneum. Musculoskeletal: Spondylosis identified within the thoracic spine. 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 Anterolisthesis of L5 on S1 measures 8 mm. Multilevel lumbar degenerative disc disease. IMPRESSION: 1. Imaging findings concerning for small bowel obstruction. There is abnormal dilatation of the proximal and mid small bowel loops with decreased caliber distal small bowel. Multiple air-fluid levels are identified. Exact transition point is difficult to identify with a high degree of certainty. 2. Signs of small bowel resection with anastomotic suture chain identified in the right upper quadrant of the abdomen. Bowel loop adjacent to the anastomosis exhibits abnormal wall thickening concerning for focal enteritis. 3. Small volume of ascites. No free air or focal fluid collections identified. 4. Status post cholecystectomy with increase in caliber of the common bile duct and  intrahepatic bile ducts. 5. Moderate stool burden noted within the colon up to the level of the rectum. 6. Aortic Atherosclerosis (ICD10-I70.0). Electronically Signed   By: Kerby Moors M.D.   On: 07/04/2022 09:40    Procedures Procedures    Medications Ordered in ED Medications  valproate (DEPACON) 125 mg in dextrose 5 % 50 mL IVPB (has no administration in time range)  HYDROmorphone (DILAUDID) injection 1 mg (1 mg Intravenous Given 07/04/22 0733)  ondansetron (ZOFRAN) injection 4 mg (4 mg Intravenous Given 07/04/22 0733)  sodium chloride 0.9 % bolus 500 mL (0 mLs Intravenous Stopped 07/04/22 1011)  iohexol (OMNIPAQUE) 300 MG/ML solution 100 mL (100 mLs Intravenous Contrast Given 07/04/22 0846)  0.9 %  sodium chloride infusion ( Intravenous New Bag/Given 07/04/22 1017)  HYDROmorphone (DILAUDID) injection 0.5 mg (0.5 mg Intravenous Given 07/04/22 1113)    ED Course/ Medical Decision Making/ A&P                             Medical Decision Making Amount and/or Complexity of Data Reviewed Labs: ordered. Radiology: ordered.  Risk Prescription drug management. Decision regarding hospitalization.   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  Also history of dementia.  With patient well and eating normally at dinnertime and sudden onset of pain during melanite, highest suspicion is for repeat bowel obstruction.  [place] diagnosis also includes aortic aneurysm\biliary colic (at this time I do not see cholecystectomy on surgical history)\kidney stone.  Will proceed with diagnostic evaluation including CT scan and pain control with Dilaudid and control nausea with Zofran.  Patient has significantly improved pain control with Dilaudid and Zofran.  CT scan personally reviewed by myself as well as review of radiology interpretation.  Small bowel obstruction   Will order NG tube placement and  consultation to general surgery.  10: 21 reviewed with Dr. Donne Hazel general surgery.  Agrees with NG tube placement and request admission consultation to hospitalist service.  He will see the patient in consult.  Consult: Dr. Wynelle Cleveland [facility] hospitalist for admission.       Final Clinical Impression(s) / ED Diagnoses Final diagnoses:  SBO (small bowel obstruction) ([place])    Rx / DC Orders ED Discharge Orders     None         Charlesetta Shanks, MD 07/04/22 1256

## 2022-07-05 ENCOUNTER — Inpatient Hospital Stay (HOSPITAL_COMMUNITY): Payer: Medicare Other

## 2022-07-05 DIAGNOSIS — K56609 Unspecified intestinal obstruction, unspecified as to partial versus complete obstruction: Secondary | ICD-10-CM | POA: Diagnosis not present

## 2022-07-05 LAB — BASIC METABOLIC PANEL
Anion gap: 9 (ref 5–15)
BUN: 18 mg/dL (ref 8–23)
CO2: 22 mmol/L (ref 22–32)
Calcium: 8.7 mg/dL — ABNORMAL LOW (ref 8.9–10.3)
Chloride: 107 mmol/L (ref 98–111)
Creatinine, Ser: 1.18 mg/dL (ref 0.61–1.24)
GFR, Estimated: >60 mL/min (ref >60)
Glucose, Bld: 130 mg/dL — ABNORMAL HIGH (ref 70–99)
Potassium: 3.9 mmol/L (ref 3.5–5.1)
Sodium: 138 mmol/L (ref 135–145)

## 2022-07-05 LAB — CBC
HCT: 44.9 % (ref 39.0–52.0)
Hemoglobin: 15.2 g/dL (ref 13.0–17.0)
MCH: 31.4 pg (ref 26.0–34.0)
MCHC: 33.9 g/dL (ref 30.0–36.0)
MCV: 92.8 fL (ref 80.0–100.0)
Platelets: 238 10*3/uL (ref 150–400)
RBC: 4.84 MIL/uL (ref 4.22–5.81)
RDW: 12 % (ref 11.5–15.5)
WBC: 14.1 10*3/uL — ABNORMAL HIGH (ref 4.0–10.5)
nRBC: 0 % (ref 0.0–0.2)

## 2022-07-05 MED ORDER — HALOPERIDOL LACTATE 5 MG/ML IJ SOLN
2.0000 mg | Freq: Four times a day (QID) | INTRAMUSCULAR | Status: DC | PRN
  Administered 2022-07-06 (×3): 2 mg via INTRAVENOUS
  Administered 2022-07-07: 4 mg via INTRAVENOUS
  Filled 2022-07-05 (×4): qty 1

## 2022-07-05 NOTE — Progress Notes (Addendum)
[facility] Progress Note  Patient: Jason Stephenson     [id number]  DOA: 07/04/2022   PCP: Faustino Congress, NP       Brief hospital course: Jason Stephenson is a 77 y.o. male with medical history of dementia and a perforated diverticulitis in the jejunum on 5/23 who underwent a laparoscopic surgery presents with acute onset abdominal pain starting last night. His dementia prevents him from giving a history and his wife is only able to tell us he was doubled over in pain and appeared to be in more pain than he was the last time he had diverticulitis. In the ED he is found to have small bowel obstruction with out a transition point. The patient has no complaints.   Subjective:  He does not answer questions appropriately. Wife is at his bedside and states he has been quite agitated and trying to pull NG out.   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 - continue Seroquel to prevent agitation overnight but clamp NG for 30 min after giving - sitter and mittens ordered to help prevent him from pulling NG however, he still pulled it out last night- IV Haldol PRN ordered for agitation - EKG shows normal QT - Hold Aricept and Namenda         Code Status: DNR Consultants: general surgery Level of Care: Level of care: Med-Surg Total time on patient care: 35 DVT prophylaxis:  enoxaparin (LOVENOX) injection 40 mg Start: 07/04/22 2200     Objective:   Vitals:   07/04/22 1906 07/04/22 2308 07/05/22 0513 07/05/22 1327  BP: (!) 144/79 129/89 (!) 152/82 (!) 155/83  Pulse: 67 63 78 62  Resp: 18 18 17 18  $ Temp: 97.6 F (36.4 C) 97.6 F (36.4 C) 98.2 F (36.8 C) 98.1 F (36.7 C)  TempSrc: Oral Oral Oral Oral  SpO2:  98% 100% 94% 95%  Weight:      Height:       Filed Weights   07/04/22 0654  Weight: 69.4 kg   Exam: General exam: alternated between sleeping and being agitated HEENT:  NG present Respiratory system: Clear to auscultation.  Cardiovascular system: S1 & S2 heard  Gastrointestinal system: Abdomen soft, non-tender, nondistended. No Bowel sounds Extremities: No cyanosis, clubbing or edema Psychiatry:  Mood & affect appropriate.     CBC: Recent Labs  Lab 07/04/22 0709 07/05/22 0548  WBC 12.6* 14.1*  NEUTROABS 11.0*  --   HGB 15.9 15.2  HCT 46.3 44.9  MCV 91.9 92.8  PLT 245 [id number]   Basic Metabolic Panel: Recent Labs  Lab 07/04/22 0709 07/05/22 0548  NA 140 138  K 3.6 3.9  CL 105 107  CO2 24 22  GLUCOSE 106* 130*  BUN 21 18  CREATININE 1.23 1.18  CALCIUM 8.7* 8.7*   GFR: Estimated Creatinine Clearance: 52.3 mL/min (by C-G formula based on SCr of 1.18 mg/dL).  Scheduled Meds:  enoxaparin (LOVENOX) injection  40 mg Subcutaneous Q24H    HYDROmorphone (DILAUDID) injection  1 mg Intravenous Once   QUEtiapine  25 mg Oral QHS   Continuous Infusions:  0.9 % NaCl with KCl 20 mEq / L 100 mL/hr at 07/05/22 1302   Imaging and lab data was personally reviewed DG  Abd 1 View  Result Date: 07/05/2022 CLINICAL DATA:  Check gastric catheter placement EXAM: ABDOMEN - 1 VIEW COMPARISON:  Film from earlier in the same day. FINDINGS: Gastric catheter is been reinserted. Contrast material lies in the stomach. Stable small bowel dilatation is seen. IMPRESSION: Gastric catheter within the stomach. Electronically Signed   By: Inez Catalina M.D.   On: 07/05/2022 01:23   DG Abd Portable 1V-Small Bowel Obstruction Protocol-initial, 8 hr delay  Result Date: 07/05/2022 CLINICAL DATA:  Small-bowel obstruction protocol, 8 hour delay. EXAM: PORTABLE ABDOMEN - 1 VIEW COMPARISON:  None Available. FINDINGS: Multiple loops of distended small bowel are present in the abdomen measuring up to 5.6 cm.  Contrast is present in the stomach. No contrast is seen in the small bowel at this time. Cholecystectomy clips are present in the right upper quadrant. No radio-opaque calculi or other acute radiographic abnormality are seen. Excreted contrast is present in the urinary bladder. IMPRESSION: Persistent dilated loops of small bowel in the abdomen measuring up to 5.6 cm, concerning for small bowel obstruction. Contrast has not progressed beyond the uterine fundus at this time. Electronically Signed   By: Brett Fairy M.D.   On: 07/05/2022 00:25   DG Abd Portable 1V  Result Date: 07/04/2022 CLINICAL DATA:  Confirm NG tube placement after advancement. EXAM: PORTABLE ABDOMEN - 1 VIEW COMPARISON:  Earlier same day radiograph at 1350 hours; CT abdomen pelvis at 0852 hours FINDINGS: Interval advancement of the nasogastric tube with the tip overlying the upper gastric body. Oral contrast has been injected and projects over the gastric fundus. Several loops of dilated bowel measuring up to 5.5 cm in diameter are seen in the mid and lower abdomen. Surgical clips overlie the right upper abdomen. Visualized lower lung fields are clear. IMPRESSION: Interval advancement of the nasogastric tube with the tip overlying the upper gastric body. Electronically Signed   By: Ileana Roup M.D.   On: 07/04/2022 18:15   DG Abd Portable 1V-Small Bowel Protocol-Position Verification  Result Date: 07/04/2022 CLINICAL DATA:  NGT placement. EXAM: PORTABLE ABDOMEN - 1 VIEW COMPARISON:  10/11/2021. FINDINGS: Image of the upper abdomen demonstrates NG tube tip superimposed with the stomach. Side port may be in the distal esophagus and the tube should be advanced about 7 cm. IMPRESSION: Side port may be in the distal esophagus, and the tube should be advanced about 7 cm. Electronically Signed   By: Sammie Bench M.D.   On: 07/04/2022 14:05   CT Abdomen Pelvis W Contrast  Result Date: 07/04/2022 CLINICAL DATA:  Abdominal pain EXAM: CT  ABDOMEN AND PELVIS WITH CONTRAST TECHNIQUE: Multidetector CT imaging of the abdomen and pelvis was performed using the standard protocol following bolus administration of intravenous contrast. RADIATION DOSE REDUCTION: This exam was performed according to the departmental dose-optimization program which includes automated exposure control, adjustment of the mA and/or kV according to patient size and/or use of iterative reconstruction technique. CONTRAST:  118m OMNIPAQUE IOHEXOL 300 MG/ML  SOLN COMPARISON:  10/08/2021 FINDINGS: Lower chest: Atelectasis is identified within the posterior lung bases. No pleural fluid or airspace consolidation. Hepatobiliary: No focal liver lesion identified. Status post cholecystectomy. Mild intrahepatic bile duct dilatation and common bile duct dilatation noted. The CBD measures up to 1.1 cm, image 35/4. Previously 1 cm. Pancreas: Unremarkable. No pancreatic ductal dilatation or surrounding inflammatory changes. Spleen: Normal in size without focal abnormality. Adrenals/Urinary Tract: Normal adrenal glands. No hydronephrosis, nephrolithiasis or suspicious kidney mass. Urinary bladder appears normal. The  Stomach/Bowel: Small hiatal hernia. Beyond the level of the ligament of Treitz there is abnormal small bowel dilatation with multiple air-fluid levels. Small bowel loops measure up to 4.7 cm in diameter, image 19/4. Signs of interval small-bowel resection with entero enteric anastomosis noted in the right upper quadrant of the abdomen. Adjacent to the anastomotic suture line there is a short segment of abnormal small bowel wall thickening with surrounding soft tissue stranding, image 46/2. The distal small bowel loops are nondilated. Imaging findings are concerning for at small bowel obstruction. The exact transition point is difficult to identified with a high degree of certainty. Normal caliber of the colon. Moderate stool burden is noted within the colon up to the level of the  rectum. Sigmoid diverticulosis without signs of acute diverticulitis. Vascular/Lymphatic: Aortic atherosclerosis. No aneurysm. No signs of abdominopelvic adenopathy. Reproductive: Mild prostate gland enlargement. Other: Small volume of ascites identified within the right upper quadrant of the abdomen extending over left for and in the dependent portion of the pelvis. No discrete fluid collections identified. No signs of pneumoperitoneum. Musculoskeletal: Spondylosis identified within the thoracic spine. 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 Anterolisthesis of L5 on S1 measures 8 mm. Multilevel lumbar degenerative disc disease. IMPRESSION: 1. Imaging findings concerning for small bowel obstruction. There is abnormal dilatation of the proximal and mid small bowel loops with decreased caliber distal small bowel. Multiple air-fluid levels are identified. Exact transition point is difficult to identify with a high degree of certainty. 2. Signs of small bowel resection with anastomotic suture chain identified in the right upper quadrant of the abdomen. Bowel loop adjacent to the anastomosis exhibits abnormal wall thickening concerning for focal enteritis. 3. Small volume of ascites. No free air or focal fluid collections identified. 4. Status post cholecystectomy with increase in caliber of the common bile duct and intrahepatic bile ducts. 5. Moderate stool burden noted within the colon up to the level of the rectum. 6. Aortic Atherosclerosis (ICD10-I70.0). Electronically Signed   By: Kerby Moors M.D.   On: 07/04/2022 09:40    LOS: 1 day   Author: Debbe Odea  07/05/2022 5:48 PM  To contact [facility]>   Check the care team in [facility] and look for the attending/consulting [place] provider listed  Log into [email or web address] and use [facility]'s universal password   Go to> "[facility]"  and find provider  If you still have difficulty reaching the provider, please page the [facility] (Director on Call) for the  Hospitalists listed on amion

## 2022-07-05 NOTE — Progress Notes (Signed)
The patient pulled out his NGT with mittens on at 23:00. A safety sitter is at the bedside, and suction is off. On-call provider Sharion Dove. Olena Heckle, NP, has been notified. It is recommended to reinsert another NGT. The charge RN has tried once, but was unsuccessful. An ICU RN has been consulted and is able to insert a 14 Fr NGT. The patient is tolerating well. Both wrist belts and mittens are on. A KUB has been ordered. RN will keep monitoring.

## 2022-07-05 NOTE — Progress Notes (Signed)
Subjective/Chief Complaint: Patient with dementia, nonverbal this morning.  No apparent distress   Objective: Vital signs in last 24 hours: Temp:  [97.5 F (36.4 C)-98.2 F (36.8 C)] 98.2 F (36.8 C) (02/18 0513) Pulse Rate:  [58-78] 78 (02/18 0513) Resp:  [16-18] 17 (02/18 0513) BP: (112-152)/(63-89) 152/82 (02/18 0513) SpO2:  [94 %-100 %] 94 % (02/18 0513) Last BM Date :  (PTA)  Intake/Output from previous day: 02/17 0701 - 02/18 0700 In: 1548.9 [P.O.:30; I.V.:1518.9] Out: 1200 [Urine:400; Emesis/NG output:800] Intake/Output this shift: No intake/output data recorded.  GI: Soft mild distention no rebound or guarding  Lab Results:  Recent Labs    07/04/22 0709 07/05/22 0548  WBC 12.6* 14.1*  HGB 15.9 15.2  HCT 46.3 44.9  PLT 245 238   BMET Recent Labs    07/04/22 0709 07/05/22 0548  NA 140 138  K 3.6 3.9  CL 105 107  CO2 24 22  GLUCOSE 106* 130*  BUN 21 18  CREATININE 1.23 1.18  CALCIUM 8.7* 8.7*   PT/INR No results for input(s): "LABPROT", "INR" in the last 72 hours. ABG No results for input(s): "PHART", "HCO3" in the last 72 hours.  Invalid input(s): "PCO2", "PO2"  Studies/Results: DG Abd 1 View  Result Date: 07/05/2022 CLINICAL DATA:  Check gastric catheter placement EXAM: ABDOMEN - 1 VIEW COMPARISON:  Film from earlier in the same day. FINDINGS: Gastric catheter is been reinserted. Contrast material lies in the stomach. Stable small bowel dilatation is seen. IMPRESSION: Gastric catheter within the stomach. Electronically Signed   By: Inez Catalina M.D.   On: 07/05/2022 01:23   DG Abd Portable 1V-Small Bowel Obstruction Protocol-initial, 8 hr delay  Result Date: 07/05/2022 CLINICAL DATA:  Small-bowel obstruction protocol, 8 hour delay. EXAM: PORTABLE ABDOMEN - 1 VIEW COMPARISON:  None Available. FINDINGS: Multiple loops of distended small bowel are present in the abdomen measuring up to 5.6 cm. Contrast is present in the stomach. No contrast is  seen in the small bowel at this time. Cholecystectomy clips are present in the right upper quadrant. No radio-opaque calculi or other acute radiographic abnormality are seen. Excreted contrast is present in the urinary bladder. IMPRESSION: Persistent dilated loops of small bowel in the abdomen measuring up to 5.6 cm, concerning for small bowel obstruction. Contrast has not progressed beyond the uterine fundus at this time. Electronically Signed   By: Brett Fairy M.D.   On: 07/05/2022 00:25   DG Abd Portable 1V  Result Date: 07/04/2022 CLINICAL DATA:  Confirm NG tube placement after advancement. EXAM: PORTABLE ABDOMEN - 1 VIEW COMPARISON:  Earlier same day radiograph at 1350 hours; CT abdomen pelvis at 0852 hours FINDINGS: Interval advancement of the nasogastric tube with the tip overlying the upper gastric body. Oral contrast has been injected and projects over the gastric fundus. Several loops of dilated bowel measuring up to 5.5 cm in diameter are seen in the mid and lower abdomen. Surgical clips overlie the right upper abdomen. Visualized lower lung fields are clear. IMPRESSION: Interval advancement of the nasogastric tube with the tip overlying the upper gastric body. Electronically Signed   By: Ileana Roup M.D.   On: 07/04/2022 18:15   DG Abd Portable 1V-Small Bowel Protocol-Position Verification  Result Date: 07/04/2022 CLINICAL DATA:  NGT placement. EXAM: PORTABLE ABDOMEN - 1 VIEW COMPARISON:  10/11/2021. FINDINGS: Image of the upper abdomen demonstrates NG tube tip superimposed with the stomach. Side port may be in the distal esophagus and the tube  should be advanced about 7 cm. IMPRESSION: Side port may be in the distal esophagus, and the tube should be advanced about 7 cm. Electronically Signed   By: Sammie Bench M.D.   On: 07/04/2022 14:05   CT Abdomen Pelvis W Contrast  Result Date: 07/04/2022 CLINICAL DATA:  Abdominal pain EXAM: CT ABDOMEN AND PELVIS WITH CONTRAST TECHNIQUE:  Multidetector CT imaging of the abdomen and pelvis was performed using the standard protocol following bolus administration of intravenous contrast. RADIATION DOSE REDUCTION: This exam was performed according to the departmental dose-optimization program which includes automated exposure control, adjustment of the mA and/or kV according to patient size and/or use of iterative reconstruction technique. CONTRAST:  197m OMNIPAQUE IOHEXOL 300 MG/ML  SOLN COMPARISON:  10/08/2021 FINDINGS: Lower chest: Atelectasis is identified within the posterior lung bases. No pleural fluid or airspace consolidation. Hepatobiliary: No focal liver lesion identified. Status post cholecystectomy. Mild intrahepatic bile duct dilatation and common bile duct dilatation noted. The CBD measures up to 1.1 cm, image 35/4. Previously 1 cm. Pancreas: Unremarkable. No pancreatic ductal dilatation or surrounding inflammatory changes. Spleen: Normal in size without focal abnormality. Adrenals/Urinary Tract: Normal adrenal glands. No hydronephrosis, nephrolithiasis or suspicious kidney mass. Urinary bladder appears normal. The Stomach/Bowel: Small hiatal hernia. Beyond the level of the ligament of Treitz there is abnormal small bowel dilatation with multiple air-fluid levels. Small bowel loops measure up to 4.7 cm in diameter, image 19/4. Signs of interval small-bowel resection with entero enteric anastomosis noted in the right upper quadrant of the abdomen. Adjacent to the anastomotic suture line there is a short segment of abnormal small bowel wall thickening with surrounding soft tissue stranding, image 46/2. The distal small bowel loops are nondilated. Imaging findings are concerning for at small bowel obstruction. The exact transition point is difficult to identified with a high degree of certainty. Normal caliber of the colon. Moderate stool burden is noted within the colon up to the level of the rectum. Sigmoid diverticulosis without signs of  acute diverticulitis. Vascular/Lymphatic: Aortic atherosclerosis. No aneurysm. No signs of abdominopelvic adenopathy. Reproductive: Mild prostate gland enlargement. Other: Small volume of ascites identified within the right upper quadrant of the abdomen extending over left for and in the dependent portion of the pelvis. No discrete fluid collections identified. No signs of pneumoperitoneum. Musculoskeletal: Spondylosis identified within the thoracic spine. 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 Anterolisthesis of L5 on S1 measures 8 mm. Multilevel lumbar degenerative disc disease. IMPRESSION: 1. Imaging findings concerning for small bowel obstruction. There is abnormal dilatation of the proximal and mid small bowel loops with decreased caliber distal small bowel. Multiple air-fluid levels are identified. Exact transition point is difficult to identify with a high degree of certainty. 2. Signs of small bowel resection with anastomotic suture chain identified in the right upper quadrant of the abdomen. Bowel loop adjacent to the anastomosis exhibits abnormal wall thickening concerning for focal enteritis. 3. Small volume of ascites. No free air or focal fluid collections identified. 4. Status post cholecystectomy with increase in caliber of the common bile duct and intrahepatic bile ducts. 5. Moderate stool burden noted within the colon up to the level of the rectum. 6. Aortic Atherosclerosis (ICD10-I70.0). Electronically Signed   By: TKerby MoorsM.D.   On: 07/04/2022 09:40    Anti-infectives: Anti-infectives (From admission, onward)    None       Assessment/Plan: Small bowel obstruction-continue NG tube, films, IV fluids, and supportive care for now  Repeat KUB in a.m.  Continue with restraints so he hopefully does not pull out his NG tube  No signs of peritonitis on examination today  I I personally reviewed images of KUB showing small bowel obstruction. I reviewed recent lab values, vitals, and  notes.  This care required straight-forward level of medical decision making.    LOS: 1 day    Turner Daniels MD 07/05/2022

## 2022-07-05 NOTE — Progress Notes (Signed)
  Transition of Care [facility]) Screening Note   Patient Details  Name: Jason Stephenson Date of Birth: [birth year]   Transition of Care [facility]) CM/SW Contact:    Henrietta Dine, RN Phone Number: 07/05/2022, 10:07 AM    Transition of Care Department [facility]) has reviewed patient and no TOC needs have been identified at this time. We will continue to monitor patient advancement through interdisciplinary progression rounds. If new patient transition needs arise, please place a TOC consult.

## 2022-07-05 NOTE — Progress Notes (Signed)
The KUB has been done and verified with Gershon Cull, NP. Intermittent low suction has been turned back on.

## 2022-07-06 ENCOUNTER — Inpatient Hospital Stay (HOSPITAL_COMMUNITY): Payer: Medicare Other

## 2022-07-06 DIAGNOSIS — K56609 Unspecified intestinal obstruction, unspecified as to partial versus complete obstruction: Secondary | ICD-10-CM | POA: Diagnosis not present

## 2022-07-06 NOTE — Progress Notes (Signed)
[facility] Progress Note  Patient: Jason Stephenson     [id number]  DOA: 07/04/2022   PCP: Faustino Congress, NP       Brief hospital course: Jason Stephenson is a 77 y.o. male with medical history of dementia and a perforated diverticulitis in the jejunum on 5/23 who underwent a laparoscopic surgery presents with acute onset abdominal pain starting last night. His dementia prevents him from giving a history and his wife is only able to tell us he was doubled over in pain and appeared to be in more pain than he was the last time he had diverticulitis. In the ED he is found to have small bowel obstruction with out a transition point. The patient has no complaints.   Subjective:  He is confused. No complaints.  Assessment and Plan: Principal Problem:   SBO (small bowel obstruction) - N.p.o., NG tube, IV fluids- has bowel function today- Gen surg dc'ing NG and starting a diet- if he tolerates it, can go home tomorrow    Active Problems:   Dementia with behavioral disturbance (HCC) - severe dementia - continue Seroquel to prevent agitation overnight but clamp NG for 30 min after giving - sitter and mittens ordered to help prevent him from pulling NG however, he still pulled it out- IV Haldol PRN ordered for agitation - EKG shows normal QT - Hold Aricept and Namenda         Code Status: DNR Consultants: general surgery Level of Care: Level of care: Med-Surg Total time on patient care: 35 DVT prophylaxis:  enoxaparin (LOVENOX) injection 40 mg Start: 07/04/22 2200     Objective:   Vitals:   07/05/22 0513 07/05/22 1327 07/05/22 1948 07/06/22 0625  BP: (!) 152/82 (!) 155/83 (!) 160/101 135/67  Pulse: 78 62 61 (!) 52  Resp: 17 18 18 18  $ Temp: 98.2 F (36.8 C) 98.1 F (36.7 C) 98.7 F (37.1 C) 99.1 F (37.3 C)  TempSrc: Oral Oral Oral Oral  SpO2: 94% 95% 100% 100%  Weight:      Height:       Filed Weights   07/04/22 0654  Weight: 69.4 kg    Exam: General exam: Appears comfortable  HEENT: oral mucosa moist Respiratory system: Clear to auscultation.  Cardiovascular system: S1 & S2 heard  Gastrointestinal system: Abdomen soft, non-tender, nondistended. Normal bowel sounds   Extremities: No cyanosis, clubbing or edema Psychiatry:  Mood & affect appropriate.     CBC: Recent Labs  Lab 07/04/22 0709 07/05/22 0548  WBC 12.6* 14.1*  NEUTROABS 11.0*  --   HGB 15.9 15.2  HCT 46.3 44.9  MCV 91.9 92.8  PLT 245 [id number]    Basic Metabolic Panel: Recent Labs  Lab 07/04/22 0709 07/05/22 0548  NA 140 138  K 3.6 3.9  CL 105 107  CO2 24 22  GLUCOSE 106* 130*  BUN 21 18  CREATININE 1.23 1.18  CALCIUM 8.7* 8.7*    GFR: Estimated Creatinine Clearance: 52.3 mL/min (by C-G formula based on SCr of 1.18 mg/dL).  Scheduled Meds:  enoxaparin (LOVENOX) injection  40 mg Subcutaneous Q24H    HYDROmorphone (DILAUDID) injection  1 mg Intravenous Once   QUEtiapine  25 mg Oral QHS   Continuous Infusions:  0.9 % NaCl with KCl 20 mEq / L 100 mL/hr at 07/06/22 0920   Imaging and lab data was personally reviewed DG Abd Portable 1V  Result Date: 07/06/2022 CLINICAL DATA:  Small bowel obstruction. EXAM: PORTABLE ABDOMEN -  1 VIEW COMPARISON:  07/05/2022 FINDINGS: Nasogastric tube is stable in left upper quadrant of the abdomen and appears to be in the stomach body. The oral contrast has moved from the stomach to the colon. There is oral contrast throughout the colon. Decreased small bowel gaseous distension. IMPRESSION: 1. Decreased small bowel gaseous distension and there is now oral contrast throughout the colon. No evidence for bowel obstruction at this time. Electronically Signed   By: Markus Daft M.D.   On: 07/06/2022 09:17   DG Abd 1 View  Result Date: 07/05/2022 CLINICAL DATA:  Check gastric catheter placement EXAM: ABDOMEN - 1 VIEW COMPARISON:  Film from earlier in the same day. FINDINGS: Gastric catheter is been reinserted.  Contrast material lies in the stomach. Stable small bowel dilatation is seen. IMPRESSION: Gastric catheter within the stomach. Electronically Signed   By: Inez Catalina M.D.   On: 07/05/2022 01:23   DG Abd Portable 1V-Small Bowel Obstruction Protocol-initial, 8 hr delay  Result Date: 07/05/2022 CLINICAL DATA:  Small-bowel obstruction protocol, 8 hour delay. EXAM: PORTABLE ABDOMEN - 1 VIEW COMPARISON:  None Available. FINDINGS: Multiple loops of distended small bowel are present in the abdomen measuring up to 5.6 cm. Contrast is present in the stomach. No contrast is seen in the small bowel at this time. Cholecystectomy clips are present in the right upper quadrant. No radio-opaque calculi or other acute radiographic abnormality are seen. Excreted contrast is present in the urinary bladder. IMPRESSION: Persistent dilated loops of small bowel in the abdomen measuring up to 5.6 cm, concerning for small bowel obstruction. Contrast has not progressed beyond the uterine fundus at this time. Electronically Signed   By: Brett Fairy M.D.   On: 07/05/2022 00:25   DG Abd Portable 1V  Result Date: 07/04/2022 CLINICAL DATA:  Confirm NG tube placement after advancement. EXAM: PORTABLE ABDOMEN - 1 VIEW COMPARISON:  Earlier same day radiograph at 1350 hours; CT abdomen pelvis at 0852 hours FINDINGS: Interval advancement of the nasogastric tube with the tip overlying the upper gastric body. Oral contrast has been injected and projects over the gastric fundus. Several loops of dilated bowel measuring up to 5.5 cm in diameter are seen in the mid and lower abdomen. Surgical clips overlie the right upper abdomen. Visualized lower lung fields are clear. IMPRESSION: Interval advancement of the nasogastric tube with the tip overlying the upper gastric body. Electronically Signed   By: Ileana Roup M.D.   On: 07/04/2022 18:15   DG Abd Portable 1V-Small Bowel Protocol-Position Verification  Result Date: 07/04/2022 CLINICAL DATA:   NGT placement. EXAM: PORTABLE ABDOMEN - 1 VIEW COMPARISON:  10/11/2021. FINDINGS: Image of the upper abdomen demonstrates NG tube tip superimposed with the stomach. Side port may be in the distal esophagus and the tube should be advanced about 7 cm. IMPRESSION: Side port may be in the distal esophagus, and the tube should be advanced about 7 cm. Electronically Signed   By: Sammie Bench M.D.   On: 07/04/2022 14:05    LOS: 2 days   Author: Debbe Odea  07/06/2022 11:43 AM  To contact [facility]>   Check the care team in [facility] and look for the attending/consulting [place] provider listed  Log into [email or web address] and use [place]'s universal password   Go to> "[facility]"  and find provider  If you still have difficulty reaching the provider, please page the [facility] (Director on Call) for the Hospitalists listed on amion

## 2022-07-06 NOTE — Progress Notes (Signed)
Subjective: CC: Pleasantly demented. Haldol at 0215 this am. No pain medication yesterday or today. Last dose of prn zofran was [id number] yesterday. He denies any abdominal pain right now. NT at bedside reports he is passing flatus. 4 liquid stools over the last 24 hours. NGT cannister with ~600cc bilious output; 400cc/24 hours per I/O. No labs done today. Xray pending.   Afebrile. No tachycardia or hypotension. No labs done today.   Objective: Vital signs in last 24 hours: Temp:  [98.1 F (36.7 C)-99.1 F (37.3 C)] 99.1 F (37.3 C) (02/19 0625) Pulse Rate:  [52-62] 52 (02/19 0625) Resp:  [18] 18 (02/19 0625) BP: (135-160)/(67-101) 135/67 (02/19 0625) SpO2:  [95 %-100 %] 100 % (02/19 0625) Last BM Date : 07/05/22  Intake/Output from previous day: 02/18 0701 - 02/19 0700 In: 2636 [P.O.:120; I.V.:2516] Out: 1500 [Urine:1100; Emesis/NG output:400] Intake/Output this shift: No intake/output data recorded.  PE: Gen:  Alert, NAD, pleasant Abd: Soft, NT, does not appear distended, +BS  Lab Results:  Recent Labs    07/04/22 0709 07/05/22 0548  WBC 12.6* 14.1*  HGB 15.9 15.2  HCT 46.3 44.9  PLT 245 238   BMET Recent Labs    07/04/22 0709 07/05/22 0548  NA 140 138  K 3.6 3.9  CL 105 107  CO2 24 22  GLUCOSE 106* 130*  BUN 21 18  CREATININE 1.23 1.18  CALCIUM 8.7* 8.7*   PT/INR No results for input(s): "LABPROT", "INR" in the last 72 hours. CMP     Component Value Date/Time   NA 138 07/05/2022 0548   K 3.9 07/05/2022 0548   CL 107 07/05/2022 0548   CO2 22 07/05/2022 0548   GLUCOSE 130 (H) 07/05/2022 0548   BUN 18 07/05/2022 0548   CREATININE 1.18 07/05/2022 0548   CALCIUM 8.7 (L) 07/05/2022 0548   PROT 5.9 (L) 07/04/2022 0709   ALBUMIN 3.5 07/04/2022 0709   AST 20 07/04/2022 0709   ALT 13 07/04/2022 0709   ALKPHOS 65 07/04/2022 0709   BILITOT 0.9 07/04/2022 0709   GFRNONAA >60 07/05/2022 0548   Lipase     Component Value Date/Time   LIPASE 41  07/04/2022 0709    Studies/Results: DG Abd 1 View  Result Date: 07/05/2022 CLINICAL DATA:  Check gastric catheter placement EXAM: ABDOMEN - 1 VIEW COMPARISON:  Film from earlier in the same day. FINDINGS: Gastric catheter is been reinserted. Contrast material lies in the stomach. Stable small bowel dilatation is seen. IMPRESSION: Gastric catheter within the stomach. Electronically Signed   By: Inez Catalina M.D.   On: 07/05/2022 01:23   DG Abd Portable 1V-Small Bowel Obstruction Protocol-initial, 8 hr delay  Result Date: 07/05/2022 CLINICAL DATA:  Small-bowel obstruction protocol, 8 hour delay. EXAM: PORTABLE ABDOMEN - 1 VIEW COMPARISON:  None Available. FINDINGS: Multiple loops of distended small bowel are present in the abdomen measuring up to 5.6 cm. Contrast is present in the stomach. No contrast is seen in the small bowel at this time. Cholecystectomy clips are present in the right upper quadrant. No radio-opaque calculi or other acute radiographic abnormality are seen. Excreted contrast is present in the urinary bladder. IMPRESSION: Persistent dilated loops of small bowel in the abdomen measuring up to 5.6 cm, concerning for small bowel obstruction. Contrast has not progressed beyond the uterine fundus at this time. Electronically Signed   By: Brett Fairy M.D.   On: 07/05/2022 00:25   DG Abd Portable 1V  Result  Date: 07/04/2022 CLINICAL DATA:  Confirm NG tube placement after advancement. EXAM: PORTABLE ABDOMEN - 1 VIEW COMPARISON:  Earlier same day radiograph at 1350 hours; CT abdomen pelvis at 0852 hours FINDINGS: Interval advancement of the nasogastric tube with the tip overlying the upper gastric body. Oral contrast has been injected and projects over the gastric fundus. Several loops of dilated bowel measuring up to 5.5 cm in diameter are seen in the mid and lower abdomen. Surgical clips overlie the right upper abdomen. Visualized lower lung fields are clear. IMPRESSION: Interval advancement  of the nasogastric tube with the tip overlying the upper gastric body. Electronically Signed   By: Ileana Roup M.D.   On: 07/04/2022 18:15   DG Abd Portable 1V-Small Bowel Protocol-Position Verification  Result Date: 07/04/2022 CLINICAL DATA:  NGT placement. EXAM: PORTABLE ABDOMEN - 1 VIEW COMPARISON:  10/11/2021. FINDINGS: Image of the upper abdomen demonstrates NG tube tip superimposed with the stomach. Side port may be in the distal esophagus and the tube should be advanced about 7 cm. IMPRESSION: Side port may be in the distal esophagus, and the tube should be advanced about 7 cm. Electronically Signed   By: Sammie Bench M.D.   On: 07/04/2022 14:05   CT Abdomen Pelvis W Contrast  Result Date: 07/04/2022 CLINICAL DATA:  Abdominal pain EXAM: CT ABDOMEN AND PELVIS WITH CONTRAST TECHNIQUE: Multidetector CT imaging of the abdomen and pelvis was performed using the standard protocol following bolus administration of intravenous contrast. RADIATION DOSE REDUCTION: This exam was performed according to the departmental dose-optimization program which includes automated exposure control, adjustment of the mA and/or kV according to patient size and/or use of iterative reconstruction technique. CONTRAST:  138m OMNIPAQUE IOHEXOL 300 MG/ML  SOLN COMPARISON:  10/08/2021 FINDINGS: Lower chest: Atelectasis is identified within the posterior lung bases. No pleural fluid or airspace consolidation. Hepatobiliary: No focal liver lesion identified. Status post cholecystectomy. Mild intrahepatic bile duct dilatation and common bile duct dilatation noted. The CBD measures up to 1.1 cm, image 35/4. Previously 1 cm. Pancreas: Unremarkable. No pancreatic ductal dilatation or surrounding inflammatory changes. Spleen: Normal in size without focal abnormality. Adrenals/Urinary Tract: Normal adrenal glands. No hydronephrosis, nephrolithiasis or suspicious kidney mass. Urinary bladder appears normal. The Stomach/Bowel: Small  hiatal hernia. Beyond the level of the ligament of Treitz there is abnormal small bowel dilatation with multiple air-fluid levels. Small bowel loops measure up to 4.7 cm in diameter, image 19/4. Signs of interval small-bowel resection with entero enteric anastomosis noted in the right upper quadrant of the abdomen. Adjacent to the anastomotic suture line there is a short segment of abnormal small bowel wall thickening with surrounding soft tissue stranding, image 46/2. The distal small bowel loops are nondilated. Imaging findings are concerning for at small bowel obstruction. The exact transition point is difficult to identified with a high degree of certainty. Normal caliber of the colon. Moderate stool burden is noted within the colon up to the level of the rectum. Sigmoid diverticulosis without signs of acute diverticulitis. Vascular/Lymphatic: Aortic atherosclerosis. No aneurysm. No signs of abdominopelvic adenopathy. Reproductive: Mild prostate gland enlargement. Other: Small volume of ascites identified within the right upper quadrant of the abdomen extending over left for and in the dependent portion of the pelvis. No discrete fluid collections identified. No signs of pneumoperitoneum. Musculoskeletal: Spondylosis identified within the thoracic spine. 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 Anterolisthesis of L5 on S1 measures 8 mm. Multilevel lumbar degenerative disc disease. IMPRESSION: 1. Imaging findings concerning for  small bowel obstruction. There is abnormal dilatation of the proximal and mid small bowel loops with decreased caliber distal small bowel. Multiple air-fluid levels are identified. Exact transition point is difficult to identify with a high degree of certainty. 2. Signs of small bowel resection with anastomotic suture chain identified in the right upper quadrant of the abdomen. Bowel loop adjacent to the anastomosis exhibits abnormal wall thickening concerning for focal enteritis. 3. Small  volume of ascites. No free air or focal fluid collections identified. 4. Status post cholecystectomy with increase in caliber of the common bile duct and intrahepatic bile ducts. 5. Moderate stool burden noted within the colon up to the level of the rectum. 6. Aortic Atherosclerosis (ICD10-I70.0). Electronically Signed   By: Kerby Moors M.D.   On: 07/04/2022 09:40    Anti-infectives: Anti-infectives (From admission, onward)    None        Assessment/Plan SBO - CT 2/17 w/ sbo without exact transition point identified. 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 CT on admission has bowel loop adjacent to the anastomosis exhibiting abnormal wall thickening concerning for focal enteritis. - AFVSS. No peritonitis on exam. No current indication for emergency surgery.  - Patient with return of bowel function and contrast in colon on xray. D/c NGT and start CLD.   FEN - D/c NGT, CLD, IVF per [facility] VTE - SCDs, Lovenox ID - None Plan - Adv diet.   I reviewed nursing notes, hospitalist notes, last 24 h vitals and pain scores, last 48 h intake and output, last 24 h labs and trends, and last 24 h imaging results.   LOS: 2 days    Jillyn Ledger , [facility] 07/06/2022, 7:35 AM Please see Amion for pager number during day hours 7:00am-4:30pm

## 2022-07-06 NOTE — Progress Notes (Signed)
Mobility Specialist - Progress Note   07/06/22 1516  Mobility  Activity Ambulated with assistance in hallway  Level of Assistance Contact guard assist, steadying assist  Assistive Device Front wheel walker  Distance Ambulated (ft) 500 ft  Range of Motion/Exercises Active  Activity Response Tolerated well  Mobility Referral Yes  $Mobility charge 1 Mobility   Pt was found in bed and agreeable to ambulate. Pt confused and requiring verbal cues for safety and sometimes physical assistance during ambulation. No complaints and at EOS returned to bed with all necessities in reach. Bed alarm on.  Ferd Hibbs Mobility Specialist

## 2022-07-07 DIAGNOSIS — K56609 Unspecified intestinal obstruction, unspecified as to partial versus complete obstruction: Secondary | ICD-10-CM | POA: Diagnosis not present

## 2022-07-07 NOTE — Discharge Summary (Signed)
Physician Discharge Summary  Jason Stephenson [id number] DOB: 01-Jul-1945 DOA: 07/04/2022  PCP: Faustino Congress, NP  Admit date: 07/04/2022 Discharge date: 07/07/2022 Discharging to: home  Consults:  General surgery Procedures:  NG tube   Discharge Diagnoses:   Principal Problem:   SBO (small bowel obstruction) ([place]) Active Problems:   Dementia with behavioral disturbance [facility])     Hospital Course:  Jason Stephenson is a 77 y.o. male with medical history of dementia and a perforated diverticulitis in the jejunum on 5/23 who underwent a laparoscopic surgery presents with acute onset abdominal pain starting last night. His dementia prevents him from giving a history and his wife is only able to tell us he was doubled over in pain and appeared to be in more pain than he was the last time he had diverticulitis. In the ED he is found to have small bowel obstruction with out a transition point. The patient has no complaints.   Principal Problem:   SBO (small bowel obstruction) - N.p.o., NG tube, IV fluids-has improved with bowel rest - tolerating solids     Active Problems:   Dementia with behavioral disturbance (HCC) - severe dementia - EKG shows normal QT - resume Aricept and Namenda         Discharge Instructions  Discharge Instructions     Increase activity slowly   Complete by: As directed       Allergies as of 07/07/2022   No Active Allergies      Medication List     TAKE these medications    finasteride 5 MG tablet Commonly known as: PROSCAR Take 5 mg by mouth daily.   memantine 10 MG tablet Commonly known as: NAMENDA Take 1 tablet by mouth twice daily   SEROquel 25 MG tablet Generic drug: QUEtiapine Take 25 mg by mouth at bedtime.   tadalafil 5 MG tablet Commonly known as: CIALIS Take 5 mg by mouth daily.   Vitamin D3 25 MCG (1000 UT) capsule Take 1,000 Units by mouth daily.            The results of significant  diagnostics from this hospitalization (including imaging, microbiology, ancillary and laboratory) are listed below for reference.    DG Abd Portable 1V  Result Date: 07/06/2022 CLINICAL DATA:  Small bowel obstruction. EXAM: PORTABLE ABDOMEN - 1 VIEW COMPARISON:  07/05/2022 FINDINGS: Nasogastric tube is stable in left upper quadrant of the abdomen and appears to be in the stomach body. The oral contrast has moved from the stomach to the colon. There is oral contrast throughout the colon. Decreased small bowel gaseous distension. IMPRESSION: 1. Decreased small bowel gaseous distension and there is now oral contrast throughout the colon. No evidence for bowel obstruction at this time. Electronically Signed   By: Markus Daft M.D.   On: 07/06/2022 09:17   DG Abd 1 View  Result Date: 07/05/2022 CLINICAL DATA:  Check gastric catheter placement EXAM: ABDOMEN - 1 VIEW COMPARISON:  Film from earlier in the same day. FINDINGS: Gastric catheter is been reinserted. Contrast material lies in the stomach. Stable small bowel dilatation is seen. IMPRESSION: Gastric catheter within the stomach. Electronically Signed   By: Inez Catalina M.D.   On: 07/05/2022 01:23   DG Abd Portable 1V-Small Bowel Obstruction Protocol-initial, 8 hr delay  Result Date: 07/05/2022 CLINICAL DATA:  Small-bowel obstruction protocol, 8 hour delay. EXAM: PORTABLE ABDOMEN - 1 VIEW COMPARISON:  None Available. FINDINGS: Multiple loops of distended small bowel are present in  the abdomen measuring up to 5.6 cm. Contrast is present in the stomach. No contrast is seen in the small bowel at this time. Cholecystectomy clips are present in the right upper quadrant. No radio-opaque calculi or other acute radiographic abnormality are seen. Excreted contrast is present in the urinary bladder. IMPRESSION: Persistent dilated loops of small bowel in the abdomen measuring up to 5.6 cm, concerning for small bowel obstruction. Contrast has not progressed beyond the  uterine fundus at this time. Electronically Signed   By: Brett Fairy M.D.   On: 07/05/2022 00:25   DG Abd Portable 1V  Result Date: 07/04/2022 CLINICAL DATA:  Confirm NG tube placement after advancement. EXAM: PORTABLE ABDOMEN - 1 VIEW COMPARISON:  Earlier same day radiograph at 1350 hours; CT abdomen pelvis at 0852 hours FINDINGS: Interval advancement of the nasogastric tube with the tip overlying the upper gastric body. Oral contrast has been injected and projects over the gastric fundus. Several loops of dilated bowel measuring up to 5.5 cm in diameter are seen in the mid and lower abdomen. Surgical clips overlie the right upper abdomen. Visualized lower lung fields are clear. IMPRESSION: Interval advancement of the nasogastric tube with the tip overlying the upper gastric body. Electronically Signed   By: Ileana Roup M.D.   On: 07/04/2022 18:15   DG Abd Portable 1V-Small Bowel Protocol-Position Verification  Result Date: 07/04/2022 CLINICAL DATA:  NGT placement. EXAM: PORTABLE ABDOMEN - 1 VIEW COMPARISON:  10/11/2021. FINDINGS: Image of the upper abdomen demonstrates NG tube tip superimposed with the stomach. Side port may be in the distal esophagus and the tube should be advanced about 7 cm. IMPRESSION: Side port may be in the distal esophagus, and the tube should be advanced about 7 cm. Electronically Signed   By: Sammie Bench M.D.   On: 07/04/2022 14:05   CT Abdomen Pelvis W Contrast  Result Date: 07/04/2022 CLINICAL DATA:  Abdominal pain EXAM: CT ABDOMEN AND PELVIS WITH CONTRAST TECHNIQUE: Multidetector CT imaging of the abdomen and pelvis was performed using the standard protocol following bolus administration of intravenous contrast. RADIATION DOSE REDUCTION: This exam was performed according to the departmental dose-optimization program which includes automated exposure control, adjustment of the mA and/or kV according to patient size and/or use of iterative reconstruction technique.  CONTRAST:  123m OMNIPAQUE IOHEXOL 300 MG/ML  SOLN COMPARISON:  10/08/2021 FINDINGS: Lower chest: Atelectasis is identified within the posterior lung bases. No pleural fluid or airspace consolidation. Hepatobiliary: No focal liver lesion identified. Status post cholecystectomy. Mild intrahepatic bile duct dilatation and common bile duct dilatation noted. The CBD measures up to 1.1 cm, image 35/4. Previously 1 cm. Pancreas: Unremarkable. No pancreatic ductal dilatation or surrounding inflammatory changes. Spleen: Normal in size without focal abnormality. Adrenals/Urinary Tract: Normal adrenal glands. No hydronephrosis, nephrolithiasis or suspicious kidney mass. Urinary bladder appears normal. The Stomach/Bowel: Small hiatal hernia. Beyond the level of the ligament of Treitz there is abnormal small bowel dilatation with multiple air-fluid levels. Small bowel loops measure up to 4.7 cm in diameter, image 19/4. Signs of interval small-bowel resection with entero enteric anastomosis noted in the right upper quadrant of the abdomen. Adjacent to the anastomotic suture line there is a short segment of abnormal small bowel wall thickening with surrounding soft tissue stranding, image 46/2. The distal small bowel loops are nondilated. Imaging findings are concerning for at small bowel obstruction. The exact transition point is difficult to identified with a high degree of certainty. Normal caliber of the colon.  Moderate stool burden is noted within the colon up to the level of the rectum. Sigmoid diverticulosis without signs of acute diverticulitis. Vascular/Lymphatic: Aortic atherosclerosis. No aneurysm. No signs of abdominopelvic adenopathy. Reproductive: Mild prostate gland enlargement. Other: Small volume of ascites identified within the right upper quadrant of the abdomen extending over left for and in the dependent portion of the pelvis. No discrete fluid collections identified. No signs of pneumoperitoneum.  Musculoskeletal: Spondylosis identified within the thoracic spine. 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 Anterolisthesis of L5 on S1 measures 8 mm. Multilevel lumbar degenerative disc disease. IMPRESSION: 1. Imaging findings concerning for small bowel obstruction. There is abnormal dilatation of the proximal and mid small bowel loops with decreased caliber distal small bowel. Multiple air-fluid levels are identified. Exact transition point is difficult to identify with a high degree of certainty. 2. Signs of small bowel resection with anastomotic suture chain identified in the right upper quadrant of the abdomen. Bowel loop adjacent to the anastomosis exhibits abnormal wall thickening concerning for focal enteritis. 3. Small volume of ascites. No free air or focal fluid collections identified. 4. Status post cholecystectomy with increase in caliber of the common bile duct and intrahepatic bile ducts. 5. Moderate stool burden noted within the colon up to the level of the rectum. 6. Aortic Atherosclerosis (ICD10-I70.0). Electronically Signed   By: Kerby Moors M.D.   On: 07/04/2022 09:40   Labs:   Basic Metabolic Panel: Recent Labs  Lab 07/04/22 0709 07/05/22 0548  NA 140 138  K 3.6 3.9  CL 105 107  CO2 24 22  GLUCOSE 106* 130*  BUN 21 18  CREATININE 1.23 1.18  CALCIUM 8.7* 8.7*     CBC: Recent Labs  Lab 07/04/22 0709 07/05/22 0548  WBC 12.6* 14.1*  NEUTROABS 11.0*  --   HGB 15.9 15.2  HCT 46.3 44.9  MCV 91.9 92.8  PLT 245 238         SIGNED:   Debbe Odea, MD  [facility] 07/07/2022, 2:31 PM

## 2022-07-07 NOTE — Progress Notes (Signed)
Subjective: CC: Pleasantly demented. Haldol x3 yesterday and x1 this am. No prn nausea or pain medication yesterday or today. He denies any abdominal pain right now. RN reports he is tolerating cld without complaints of nausea. No vomiting. He is unsure of flatus. No bm yesterday.   Afebrile. No tachycardia or hypotension. No labs done today.   Objective: Vital signs in last 24 hours: Temp:  [97.3 F (36.3 C)-98.6 F (37 C)] 97.3 F (36.3 C) (02/20 0714) Pulse Rate:  [49-52] 49 (02/20 0714) Resp:  [17-18] 18 (02/20 0714) BP: (139-153)/(66-87) 153/80 (02/20 0714) SpO2:  [96 %-100 %] 97 % (02/20 0714) Last BM Date : 07/06/22  Intake/Output from previous day: 02/19 0701 - 02/20 0700 In: -  Out: 1600 [Urine:1600] Intake/Output this shift: No intake/output data recorded.  PE: Gen:  Alert, NAD, pleasant Abd: Soft, NT, does not appear distended, +BS  Lab Results:  Recent Labs    07/05/22 0548  WBC 14.1*  HGB 15.2  HCT 44.9  PLT 238    BMET Recent Labs    07/05/22 0548  NA 138  K 3.9  CL 107  CO2 22  GLUCOSE 130*  BUN 18  CREATININE 1.18  CALCIUM 8.7*    PT/INR No results for input(s): "LABPROT", "INR" in the last 72 hours. CMP     Component Value Date/Time   NA 138 07/05/2022 0548   K 3.9 07/05/2022 0548   CL 107 07/05/2022 0548   CO2 22 07/05/2022 0548   GLUCOSE 130 (H) 07/05/2022 0548   BUN 18 07/05/2022 0548   CREATININE 1.18 07/05/2022 0548   CALCIUM 8.7 (L) 07/05/2022 0548   PROT 5.9 (L) 07/04/2022 0709   ALBUMIN 3.5 07/04/2022 0709   AST 20 07/04/2022 0709   ALT 13 07/04/2022 0709   ALKPHOS 65 07/04/2022 0709   BILITOT 0.9 07/04/2022 0709   GFRNONAA >60 07/05/2022 0548   Lipase     Component Value Date/Time   LIPASE 41 07/04/2022 0709    Studies/Results: DG Abd Portable 1V  Result Date: 07/06/2022 CLINICAL DATA:  Small bowel obstruction. EXAM: PORTABLE ABDOMEN - 1 VIEW COMPARISON:  07/05/2022 FINDINGS: Nasogastric tube is  stable in left upper quadrant of the abdomen and appears to be in the stomach body. The oral contrast has moved from the stomach to the colon. There is oral contrast throughout the colon. Decreased small bowel gaseous distension. IMPRESSION: 1. Decreased small bowel gaseous distension and there is now oral contrast throughout the colon. No evidence for bowel obstruction at this time. Electronically Signed   By: Markus Daft M.D.   On: 07/06/2022 09:17    Anti-infectives: Anti-infectives (From admission, onward)    None        Assessment/Plan SBO - CT 2/17 w/ sbo without exact transition point identified. 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 CT on admission has bowel loop adjacent to the anastomosis exhibiting abnormal wall thickening concerning for focal enteritis. - AFVSS. No peritonitis on exam. No current indication for emergency surgery.  - Contrast in colon on xray 2/19. Patient with ROBF since admission and tolerating CLD. Exam reassuring as above. Will advance diet as tolerated. If tolerates diet advancement, okay for d/c from our standpoint.   FEN - FLD, ADAT to mechanical soft diet, IVF per [facility] VTE - SCDs, Lovenox ID - None Plan - Adv diet.   I reviewed nursing  notes, hospitalist notes, last 24 h vitals and pain scores, last 48 h intake and output, last 24 h labs and trends, and last 24 h imaging results.   LOS: 3 days    Jillyn Ledger , [facility] 07/07/2022, 8:50 AM Please see Amion for pager number during day hours 7:00am-4:30pm

## 2022-07-07 NOTE — Progress Notes (Signed)
Mobility Specialist - Progress Note   07/07/22 1347  Mobility  Activity Ambulated with assistance in hallway  Level of Assistance Contact guard assist, steadying assist  Assistive Device Front wheel walker  Distance Ambulated (ft) 700 ft  Range of Motion/Exercises Active  Activity Response Tolerated well  Mobility Referral Yes  $Mobility charge 1 Mobility   Pt was found in bed with wife in room and agreeable to ambulate. Was contact assist to get up from bed due to being unsteady. During ambulation requires safety cues and at times physical assistance due to not following safety cues given. Pt returned to recliner chair with NT and wife in room at [place].  Ferd Hibbs Mobility Specialist

## 2022-07-14 DIAGNOSIS — F03B0 Unspecified dementia, moderate, without behavioral disturbance, psychotic disturbance, mood disturbance, and anxiety: Secondary | ICD-10-CM | POA: Diagnosis not present

## 2022-07-14 DIAGNOSIS — R4701 Aphasia: Secondary | ICD-10-CM | POA: Diagnosis not present

## 2022-07-14 DIAGNOSIS — R32 Unspecified urinary incontinence: Secondary | ICD-10-CM | POA: Diagnosis not present

## 2022-07-14 DIAGNOSIS — R197 Diarrhea, unspecified: Secondary | ICD-10-CM | POA: Diagnosis not present

## 2022-07-15 ENCOUNTER — Other Ambulatory Visit: Payer: Self-pay | Admitting: Physician Assistant

## 2022-07-16 ENCOUNTER — Emergency Department (HOSPITAL_COMMUNITY): Payer: Medicare Other

## 2022-07-16 ENCOUNTER — Other Ambulatory Visit: Payer: Self-pay

## 2022-07-16 ENCOUNTER — Encounter (HOSPITAL_COMMUNITY): Payer: Self-pay

## 2022-07-16 ENCOUNTER — Inpatient Hospital Stay (HOSPITAL_COMMUNITY)
Admission: EM | Admit: 2022-07-16 | Discharge: 2022-07-18 | DRG: 390 | Disposition: A | Discharge status: Home / Self Care | Payer: Medicare Other | Source: Ambulatory Visit | Attending: Family Medicine | Admitting: Family Medicine

## 2022-07-16 DIAGNOSIS — K5730 Diverticulosis of large intestine without perforation or abscess without bleeding: Secondary | ICD-10-CM | POA: Diagnosis present

## 2022-07-16 DIAGNOSIS — R001 Bradycardia, unspecified: Secondary | ICD-10-CM | POA: Diagnosis not present

## 2022-07-16 DIAGNOSIS — F03C0 Unspecified dementia, severe, without behavioral disturbance, psychotic disturbance, mood disturbance, and anxiety: Secondary | ICD-10-CM | POA: Diagnosis not present

## 2022-07-16 DIAGNOSIS — R109 Unspecified abdominal pain: Secondary | ICD-10-CM | POA: Diagnosis not present

## 2022-07-16 DIAGNOSIS — R1032 Left lower quadrant pain: Secondary | ICD-10-CM | POA: Diagnosis not present

## 2022-07-16 DIAGNOSIS — N400 Enlarged prostate without lower urinary tract symptoms: Secondary | ICD-10-CM | POA: Diagnosis present

## 2022-07-16 DIAGNOSIS — R140 Abdominal distension (gaseous): Secondary | ICD-10-CM | POA: Diagnosis not present

## 2022-07-16 DIAGNOSIS — Z823 Family history of stroke: Secondary | ICD-10-CM

## 2022-07-16 DIAGNOSIS — K529 Noninfective gastroenteritis and colitis, unspecified: Secondary | ICD-10-CM | POA: Diagnosis not present

## 2022-07-16 DIAGNOSIS — Z87891 Personal history of nicotine dependence: Secondary | ICD-10-CM

## 2022-07-16 DIAGNOSIS — R197 Diarrhea, unspecified: Secondary | ICD-10-CM | POA: Diagnosis not present

## 2022-07-16 DIAGNOSIS — Z79899 Other long term (current) drug therapy: Secondary | ICD-10-CM | POA: Diagnosis not present

## 2022-07-16 DIAGNOSIS — R946 Abnormal results of thyroid function studies: Secondary | ICD-10-CM | POA: Diagnosis present

## 2022-07-16 DIAGNOSIS — K567 Ileus, unspecified: Secondary | ICD-10-CM | POA: Diagnosis not present

## 2022-07-16 DIAGNOSIS — K56600 Partial intestinal obstruction, unspecified as to cause: Principal | ICD-10-CM

## 2022-07-16 DIAGNOSIS — K5989 Other specified functional intestinal disorders: Secondary | ICD-10-CM | POA: Diagnosis not present

## 2022-07-16 DIAGNOSIS — R32 Unspecified urinary incontinence: Secondary | ICD-10-CM | POA: Diagnosis not present

## 2022-07-16 LAB — CBC WITH DIFFERENTIAL/PLATELET
Abs Immature Granulocytes: 0.07 10*3/uL (ref 0.00–0.07)
Basophils Absolute: 0.1 10*3/uL (ref 0.0–0.1)
Basophils Relative: 0 %
Eosinophils Absolute: 0.3 10*3/uL (ref 0.0–0.5)
Eosinophils Relative: 2 %
HCT: 47 % (ref 39.0–52.0)
Hemoglobin: 15.9 g/dL (ref 13.0–17.0)
Immature Granulocytes: 0 %
Lymphocytes Relative: 10 %
Lymphs Abs: 1.7 10*3/uL (ref 0.7–4.0)
MCH: 31.8 pg (ref 26.0–34.0)
MCHC: 33.8 g/dL (ref 30.0–36.0)
MCV: 94 fL (ref 80.0–100.0)
Monocytes Absolute: 0.9 10*3/uL (ref 0.1–1.0)
Monocytes Relative: 5 %
Neutro Abs: 14.8 10*3/uL — ABNORMAL HIGH (ref 1.7–7.7)
Neutrophils Relative %: 83 %
Platelets: 348 10*3/uL (ref 150–400)
RBC: 5 MIL/uL (ref 4.22–5.81)
RDW: 11.9 % (ref 11.5–15.5)
WBC: 17.8 10*3/uL — ABNORMAL HIGH (ref 4.0–10.5)
nRBC: 0 % (ref 0.0–0.2)

## 2022-07-16 LAB — URINALYSIS, ROUTINE W REFLEX MICROSCOPIC
Bilirubin Urine: NEGATIVE
Glucose, UA: NEGATIVE mg/dL
Hgb urine dipstick: NEGATIVE
Ketones, ur: NEGATIVE mg/dL
Leukocytes,Ua: NEGATIVE
Nitrite: NEGATIVE
Protein, ur: NEGATIVE mg/dL
Specific Gravity, Urine: 1.03 (ref 1.005–1.030)
pH: 7 (ref 5.0–8.0)

## 2022-07-16 LAB — COMPREHENSIVE METABOLIC PANEL
ALT: 15 U/L (ref 0–44)
AST: 15 U/L (ref 15–41)
Albumin: 4.1 g/dL (ref 3.5–5.0)
Alkaline Phosphatase: 71 U/L (ref 38–126)
Anion gap: 7 (ref 5–15)
BUN: 22 mg/dL (ref 8–23)
CO2: 30 mmol/L (ref 22–32)
Calcium: 8.8 mg/dL — ABNORMAL LOW (ref 8.9–10.3)
Chloride: 105 mmol/L (ref 98–111)
Creatinine, Ser: 1.36 mg/dL — ABNORMAL HIGH (ref 0.61–1.24)
GFR, Estimated: 54 mL/min — ABNORMAL LOW (ref >60)
Glucose, Bld: 96 mg/dL (ref 70–99)
Potassium: 3.7 mmol/L (ref 3.5–5.1)
Sodium: 142 mmol/L (ref 135–145)
Total Bilirubin: 0.7 mg/dL (ref 0.3–1.2)
Total Protein: 7.3 g/dL (ref 6.5–8.1)

## 2022-07-16 LAB — LACTIC ACID, PLASMA: Lactic Acid, Venous: 1 mmol/L (ref 0.5–1.9)

## 2022-07-16 MED ORDER — ONDANSETRON HCL 4 MG PO TABS: 4.0000 mg | ORAL_TABLET | Freq: Four times a day (QID) | ORAL | Status: DC | PRN

## 2022-07-16 MED ORDER — ONDANSETRON HCL 4 MG/2ML IJ SOLN
4.0000 mg | Freq: Once | INTRAMUSCULAR | Status: AC
  Administered 2022-07-16: 4 mg via INTRAVENOUS
  Filled 2022-07-16: qty 2

## 2022-07-16 MED ORDER — FENTANYL CITRATE PF 50 MCG/ML IJ SOSY: 12.5000 ug | PREFILLED_SYRINGE | INTRAMUSCULAR | Status: DC | PRN

## 2022-07-16 MED ORDER — HEPARIN SODIUM (PORCINE) 5000 UNIT/ML IJ SOLN
5000.0000 [IU] | Freq: Three times a day (TID) | INTRAMUSCULAR | Status: DC
  Administered 2022-07-16 – 2022-07-18 (×5): 5000 [IU] via SUBCUTANEOUS
  Filled 2022-07-16 (×5): qty 1

## 2022-07-16 MED ORDER — SODIUM CHLORIDE 0.9 % IV SOLN
2.0000 g | INTRAVENOUS | Status: DC
  Administered 2022-07-17 – 2022-07-18 (×2): 2 g via INTRAVENOUS
  Filled 2022-07-16 (×2): qty 20

## 2022-07-16 MED ORDER — LACTATED RINGERS IV SOLN: INTRAVENOUS | Status: DC

## 2022-07-16 MED ORDER — ACETAMINOPHEN 10 MG/ML IV SOLN: 1000.0000 mg | Freq: Three times a day (TID) | INTRAVENOUS | Status: AC | PRN

## 2022-07-16 MED ORDER — ACETAMINOPHEN 650 MG RE SUPP: 650.0000 mg | Freq: Four times a day (QID) | RECTAL | Status: DC | PRN

## 2022-07-16 MED ORDER — SODIUM CHLORIDE 0.9 % IV SOLN: INTRAVENOUS | Status: DC

## 2022-07-16 MED ORDER — ALBUTEROL SULFATE (2.5 MG/3ML) 0.083% IN NEBU: 2.5000 mg | INHALATION_SOLUTION | RESPIRATORY_TRACT | Status: DC | PRN

## 2022-07-16 MED ORDER — ONDANSETRON HCL 4 MG/2ML IJ SOLN: 4.0000 mg | Freq: Four times a day (QID) | INTRAMUSCULAR | Status: DC | PRN

## 2022-07-16 MED ORDER — METRONIDAZOLE 500 MG/100ML IV SOLN
500.0000 mg | Freq: Two times a day (BID) | INTRAVENOUS | Status: DC
  Administered 2022-07-17 – 2022-07-18 (×3): 500 mg via INTRAVENOUS
  Filled 2022-07-16 (×3): qty 100

## 2022-07-16 MED ORDER — FENTANYL CITRATE PF 50 MCG/ML IJ SOSY
25.0000 ug | PREFILLED_SYRINGE | Freq: Once | INTRAMUSCULAR | Status: AC
  Administered 2022-07-16: 25 ug via INTRAVENOUS
  Filled 2022-07-16: qty 1

## 2022-07-16 MED ORDER — IOHEXOL 300 MG/ML  SOLN
80.0000 mL | Freq: Once | INTRAMUSCULAR | Status: AC | PRN
  Administered 2022-07-16: 80 mL via INTRAVENOUS

## 2022-07-16 MED ORDER — LACTATED RINGERS IV BOLUS
1000.0000 mL | Freq: Once | INTRAVENOUS | Status: AC
  Administered 2022-07-16: 1000 mL via INTRAVENOUS

## 2022-07-16 MED ORDER — ACETAMINOPHEN 325 MG PO TABS
650.0000 mg | ORAL_TABLET | Freq: Four times a day (QID) | ORAL | Status: DC | PRN
  Filled 2022-07-16: qty 2

## 2022-07-16 NOTE — ED Notes (Signed)
ED TO INPATIENT HANDOFF REPORT  ED Nurse Name and Phone #: Jason Stephenson Name/Age/Gender Jason Stephenson 77 y.o. male Room/Bed: WA13/WA13  Code Status   Code Status: Prior  Home/SNF/Other Home Patient oriented to: self Is this baseline? Yes   Triage Complete: Triage complete  Chief Complaint Ileus [facility]) [K56.7]  Triage Note Patient believes he has a bowel obstruction due to left lower quadrant pain. He had surgery last May for a small bowel obstruction. No vomiting. Pure liquid when he has a bowel movement. No blood in stool.    Allergies No Known Allergies  Level of Care/Admitting Diagnosis ED Disposition     ED Disposition  Admit   Condition  --   Comment  Hospital Area: [facility] [[id number]]  Level of Care: Progressive [102]  Admit to Progressive based on following criteria: GI, ENDOCRINE disease patients with GI bleeding, acute liver failure or pancreatitis, stable with diabetic ketoacidosis or thyrotoxicosis (hypothyroid) state.  May admit patient to [facility] or [facility] if equivalent level of care is available:: No  Covid Evaluation: Symptomatic Person Under Investigation (PUI) or recent exposure (last 10 days) *Testing Required*  Diagnosis: Ileus [facility]) [id number]  Admitting Physician: Clance Boll [id number]  Attending Physician: Clance Boll [id number]  Certification:: I certify this patient will need inpatient services for at least 2 midnights  Estimated Length of Stay: 3          B Medical/Surgery History Past Medical History:  Diagnosis Date   Dementia ([place]) 10/08/2021   Past Surgical History:  Procedure Laterality Date   COLONOSCOPY     LAPAROSCOPIC SMALL BOWEL RESECTION N/A 10/10/2021   Procedure: LAPAROSCOPIC SMALL BOWEL RESECTION;  Surgeon: Michael Boston, MD;  Location: [facility];  Service: General;  Laterality: N/A;  bilateral tap blocks    LAPAROSCOPY  10/10/2021   Procedure: LAPAROSCOPY DIAGNOSTIC;   Surgeon: Michael Boston, MD;  Location: [facility];  Service: General;;   parathyroid surgery  04/21/2017   POLYPECTOMY       A IV Location/Drains/Wounds Patient Lines/Drains/Airways Status     Active Line/Drains/Airways     Name Placement date Placement time Site Days   Peripheral IV 07/16/22 20 G 1" Left Antecubital 07/16/22  1507  Antecubital  less than 1   External Urinary Catheter 07/16/22  1632  --  less than 1   Incision - 3 Ports Abdomen 1: Right;Lateral;Upper 2: Right;Medial;Lateral 3: Right;Lateral;Lower 10/10/21  --  -- 279            Intake/Output Last 24 hours  Intake/Output Summary (Last 24 hours) at 07/16/2022 1937 Last data filed at 07/16/2022 1726 Gross per 24 hour  Intake 999 ml  Output --  Net 999 ml    Labs/Imaging Results for orders placed or performed during the hospital encounter of 07/16/22 (from the past 48 hour(s))  CBC with Differential/Platelet     Status: Abnormal   Collection Time: 07/16/22  3:10 PM  Result Value Ref Range   WBC 17.8 (H) 4.0 - 10.5 K/uL   RBC 5.00 4.22 - 5.81 MIL/uL   Hemoglobin 15.9 13.0 - 17.0 g/dL   HCT 47.0 39.0 - 52.0 %   MCV 94.0 80.0 - 100.0 fL   MCH 31.8 26.0 - 34.0 pg   MCHC 33.8 30.0 - 36.0 g/dL   RDW 11.9 11.5 - 15.5 %   Platelets 348 150 - 400 K/uL   nRBC 0.0 0.0 - 0.2 %   Neutrophils  Relative % 83 %   Neutro Abs 14.8 (H) 1.7 - 7.7 K/uL   Lymphocytes Relative 10 %   Lymphs Abs 1.7 0.7 - 4.0 K/uL   Monocytes Relative 5 %   Monocytes Absolute 0.9 0.1 - 1.0 K/uL   Eosinophils Relative 2 %   Eosinophils Absolute 0.3 0.0 - 0.5 K/uL   Basophils Relative 0 %   Basophils Absolute 0.1 0.0 - 0.1 K/uL   Immature Granulocytes 0 %   Abs Immature Granulocytes 0.07 0.00 - 0.07 K/uL    Comment: Performed at [facility], [place] [address], [place], [place] [address]  Comprehensive metabolic panel     Status: Abnormal   Collection Time: 07/16/22  3:10 PM  Result Value Ref Range   Sodium 142 135 - 145  mmol/L   Potassium 3.7 3.5 - 5.1 mmol/L   Chloride 105 98 - 111 mmol/L   CO2 30 22 - 32 mmol/L   Glucose, Bld 96 70 - 99 mg/dL    Comment: Glucose reference range applies only to samples taken after fasting for at least 8 hours.   BUN 22 8 - 23 mg/dL   Creatinine, Ser 1.36 (H) 0.61 - 1.24 mg/dL   Calcium 8.8 (L) 8.9 - 10.3 mg/dL   Total Protein 7.3 6.5 - 8.1 g/dL   Albumin 4.1 3.5 - 5.0 g/dL   AST 15 15 - 41 U/L   ALT 15 0 - 44 U/L   Alkaline Phosphatase 71 38 - 126 U/L   Total Bilirubin 0.7 0.3 - 1.2 mg/dL   GFR, Estimated 54 (L) >60 mL/min    Comment: (NOTE) Calculated using the CKD-EPI Creatinine Equation (2021)    Anion gap 7 5 - 15    Comment: Performed at [facility] [place] [address] [place], [place] [address]  Urinalysis, Routine w reflex microscopic -Urine, Clean Catch     Status: Abnormal   Collection Time: 07/16/22  6:55 PM  Result Value Ref Range   Color, Urine YELLOW YELLOW   APPearance HAZY (A) CLEAR   Specific Gravity, Urine 1.030 1.005 - 1.030   pH 7.0 5.0 - 8.0   Glucose, UA NEGATIVE NEGATIVE mg/dL   Hgb urine dipstick NEGATIVE NEGATIVE   Bilirubin Urine NEGATIVE NEGATIVE   Ketones, ur NEGATIVE NEGATIVE mg/dL   Protein, ur NEGATIVE NEGATIVE mg/dL   Nitrite NEGATIVE NEGATIVE   Leukocytes,Ua NEGATIVE NEGATIVE    Comment: Performed at [place] [address], [place], [place] [address]   CT ABDOMEN PELVIS W CONTRAST  Result Date: 07/16/2022 CLINICAL DATA:  Left lower quadrant abdominal pain EXAM: CT ABDOMEN AND PELVIS WITH CONTRAST TECHNIQUE: Multidetector CT imaging of the abdomen and pelvis was performed using the standard protocol following bolus administration of intravenous contrast. RADIATION DOSE REDUCTION: This exam was performed according to the departmental dose-optimization program which includes automated exposure control, adjustment of the mA and/or kV according to patient size and/or use of iterative  reconstruction technique. CONTRAST:  39m OMNIPAQUE IOHEXOL 300 MG/ML  SOLN COMPARISON:  CT abdomen and pelvis 07/04/2022 FINDINGS: Lower chest: There is minimal atelectasis in the lung bases. Hepatobiliary: No focal liver abnormality is seen. Status post cholecystectomy. No biliary dilatation. Pancreas: Unremarkable. No pancreatic ductal dilatation or surrounding inflammatory changes. Spleen: Normal in size without focal abnormality. Adrenals/Urinary Tract: There are rounded cortical hypodensities in both kidneys favored as cysts. Otherwise, the kidneys, adrenal glands and bladder are within normal limits. Stomach/Bowel: Small bowel anastomosis noted  in the mid abdomen. There are few dilated. Small bowel loops with air-fluid levels in the central abdomen. No definitive transition point visualized. Stomach and colon are decompressed. There is sigmoid colon diverticulosis. The appendix is not seen. There is no inflammation or pneumatosis. Vascular/Lymphatic: Aortic atherosclerosis. No enlarged abdominal or pelvic lymph nodes. Reproductive: Prostate is unremarkable. Other: No abdominal wall hernia or abnormality. No abdominopelvic ascites. Musculoskeletal: Degenerative changes affect the spine IMPRESSION: 1. Mildly dilated small bowel loops with air-fluid levels in the central abdomen. No definitive transition point visualized. Findings may represent ileus or partial small bowel obstruction. 2. Sigmoid colon diverticulosis without evidence for diverticulitis. 3. Small renal cysts.  No follow-up imaging recommended. Aortic Atherosclerosis (ICD10-I70.0). Electronically Signed   By: Ronney Asters M.D.   On: 07/16/2022 18:16    Pending Labs Unresulted Labs (From admission, onward)     Start     Ordered   07/16/22 1915  Lactic acid, plasma  Once,   R        07/16/22 1915   07/16/22 1910  Gastrointestinal Panel by PCR , Stool  (Gastrointestinal Panel by PCR, Stool                                                                                                                                                      **Does Not include CLOSTRIDIUM DIFFICILE testing. **If CDIFF testing is needed, place order from the "C Difficile Testing" order set.**)  Once,   URGENT        07/16/22 1909   07/16/22 1910  C Difficile Quick Screen w PCR reflex  (C Difficile quick screen w PCR reflex panel )  Once, for 24 hours,   URGENT       References:    CDiff Information Tool   07/16/22 1909   07/16/22 1909  Lactic acid, plasma  STAT Now then every 3 hours,   R (with STAT occurrences)      07/16/22 1908            Vitals/Pain Today's Vitals   07/16/22 1505 07/16/22 1756 07/16/22 1800 07/16/22 1900  BP: (!) 156/75  (!) 169/92   Pulse: (!) 55  (!) 52   Resp: 19  19   Temp: 97.8 F (36.6 C)   97.8 F (36.6 C)  TempSrc: Oral     SpO2: 99%  100%   Weight:      Height:      PainSc:  4       Isolation Precautions Enteric precautions (UV disinfection)  Medications Medications  lactated ringers infusion ( Intravenous New Bag/Given 07/16/22 1923)  lactated ringers bolus 1,000 mL (0 mLs Intravenous Stopped 07/16/22 1726)  fentaNYL (SUBLIMAZE) injection 25 mcg (25 mcg Intravenous Given 07/16/22 1723)  ondansetron (ZOFRAN) injection 4 mg (4 mg Intravenous Given 07/16/22 1724)  iohexol (OMNIPAQUE) 300 MG/ML solution 80 mL (80 mLs Intravenous Contrast Given 07/16/22 1744)    Mobility walks with person assist     Focused Assessments Neuro Assessment Handoff:  Swallow screen pass? Yes          Neuro Assessment:   Neuro Checks:      Has TPA been given? No If patient is a Neuro Trauma and patient is going to OR before floor call report to [facility] nurse: [phone] or [phone]   R Recommendations: See Admitting Provider Note  Report given to:   Additional Notes:

## 2022-07-16 NOTE — H&P (Addendum)
History and Physical    Jason Stephenson [id number] DOB: [birth year] DOA: 07/16/2022  PCP: Faustino Congress, NP  Patient coming from:   I have personally briefly reviewed patient's old medical records in [place]  Chief Complaint: abdominal pain   HPI: Jason Stephenson is a 77 y.o. 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 Patient stable was discharged. Patient course further complicated by sbo for he was admitted 07/04/22. Patient was treated with conservative measures ivfs ng decompression and discharged home on 07/07/22.  Patient per wife on returning home was noted to have loose watery stools. Initially he did not have any pain however today pain started acutely and pain left lower abdomen, he was brought to ED for evaluation.   ED Course:  Afeb, bp 156/75, Hr 55, rr 19  sat 99%  Labs  Wbc 17 , hgb 15.9 , plt 348  N a 142, K 3.7, CL 105, cr1.36  (1.18) Tx LR 1 L , fentanyl   UA neg Lactic 1  IMPRESSION: 1. Mildly dilated small bowel loops with air-fluid levels in the central abdomen. No definitive transition point visualized. Findings may represent ileus or partial small bowel obstruction. 2. Sigmoid colon diverticulosis without evidence for diverticulitis. 3. Small renal cysts.  No follow-up imaging recommended.  Review of Systems: As per HPI otherwise 10 point review of systems negative.   Past Medical History:  Diagnosis Date   Dementia ([place]) 10/08/2021    Past Surgical History:  Procedure Laterality Date   COLONOSCOPY     LAPAROSCOPIC SMALL BOWEL RESECTION N/A 10/10/2021   Procedure: LAPAROSCOPIC SMALL BOWEL RESECTION;  Surgeon: Michael Boston, MD;  Location: [facility];  Service: General;  Laterality: N/A;  bilateral tap blocks    LAPAROSCOPY  10/10/2021   Procedure: LAPAROSCOPY DIAGNOSTIC;  Surgeon: Michael Boston, MD;  Location: [facility];   Service: General;;   parathyroid surgery  04/21/2017   POLYPECTOMY       reports that he has quit smoking. He has never used smokeless tobacco. He reports current alcohol use. He reports that he does not use drugs.  No Known Allergies  Family History  Problem Relation Age of Onset   Stroke Father    Colon cancer Neg Hx    Colon polyps Neg Hx    Esophageal cancer Neg Hx    Rectal cancer Neg Hx    Stomach cancer Neg Hx     Prior to Admission medications   Medication Sig Start Date End Date Taking? Authorizing Provider  Cholecalciferol (VITAMIN D3) 50 MCG (2000 UT) TABS Take 2,000 Units by mouth daily with breakfast.   Yes [provider]  donepezil (ARICEPT) 10 MG tablet Take 1 tablet (10 mg total) by mouth daily. Patient taking differently: Take 10 mg by mouth in the morning. 07/15/22  Yes Shawn Route, Coralee Pesa, PA-C  finasteride (PROSCAR) 5 MG tablet Take 5 mg by mouth daily.   Yes [provider]  memantine (NAMENDA) 10 MG tablet Take 1 tablet by mouth twice daily Patient taking differently: Take 10 mg by mouth in the morning and at bedtime. Take 1 tablet by mouth twice daily 06/02/22  Yes Wertman, Coralee Pesa, PA-C  SEROQUEL 25 MG tablet Take 25 mg by mouth at bedtime. 06/08/22  Yes [provider]  tadalafil (CIALIS) 5 MG tablet Take 5 mg by mouth daily.   Yes [provider]  Physical Exam: Vitals:   07/16/22 1800 07/16/22 1900 07/16/22 1921 07/16/22 2014  BP: (!) 169/92  (!) 143/86 138/74  Pulse: (!) 52  61 (!) 56  Resp: '19  14 20  '$ Temp:  97.8 F (36.6 C)  98 F (36.7 C)  TempSrc:    Oral  SpO2: 100%  100% 100%  Weight:      Height:        Constitutional: NAD, calm, comfortable Vitals:   07/16/22 1800 07/16/22 1900 07/16/22 1921 07/16/22 2014  BP: (!) 169/92  (!) 143/86 138/74  Pulse: (!) 52  61 (!) 56  Resp: '19  14 20  '$ Temp:  97.8 F (36.6 C)  98 F (36.7 C)  TempSrc:    Oral  SpO2: 100%  100% 100%  Weight:      Height:        Eyes: PERRL, lids and conjunctivae normal ENMT: Mucous membranes are moist. Posterior pharynx clear of any exudate or lesions.Normal dentition.  Neck: normal, supple, no masses, no thyromegaly Respiratory: clear to auscultation bilaterally, no wheezing, no crackles. Normal respiratory effort. No accessory muscle use.  Cardiovascular: Regular rate and rhythm, no murmurs / rubs / gallops. No extremity edema. 2+ pedal pulses. Abdomen: + LLQ tenderness, no masses palpated. No hepatosplenomegaly. Bowel sounds positive. Voluntary gaurding,  Musculoskeletal: no clubbing / cyanosis. No joint deformity upper and lower extremities. Good ROM, no contractures. Normal muscle tone.  Skin: no rashes, lesions, ulcers. No induration Neurologic: CN 2-12 grossly intact. Sensation intact,  Strength 5/5 in all 4.  Psychiatric: Normal judgment and insight. Alert and oriented x 3. Normal mood.    Labs on Admission: I have personally reviewed following labs and imaging studies  CBC: Recent Labs  Lab 07/16/22 1510  WBC 17.8*  NEUTROABS 14.8*  HGB 15.9  HCT 47.0  MCV 94.0  PLT 0000000   Basic Metabolic Panel: Recent Labs  Lab 07/16/22 1510  NA 142  K 3.7  CL 105  CO2 30  GLUCOSE 96  BUN 22  CREATININE 1.36*  CALCIUM 8.8*   GFR: Estimated Creatinine Clearance: 45.8 mL/min (A) (by C-G formula based on SCr of 1.36 mg/dL (H)). Liver Function Tests: Recent Labs  Lab 07/16/22 1510  AST 15  ALT 15  ALKPHOS 71  BILITOT 0.7  PROT 7.3  ALBUMIN 4.1   No results for input(s): "LIPASE", "AMYLASE" in the last 168 hours. No results for input(s): "AMMONIA" in the last 168 hours. Coagulation Profile: No results for input(s): "INR", "PROTIME" in the last 168 hours. Cardiac Enzymes: No results for input(s): "CKTOTAL", "CKMB", "CKMBINDEX", "TROPONINI" in the last 168 hours. BNP (last 3 results) No results for input(s): "PROBNP" in the last 8760 hours. HbA1C: No results for input(s): "HGBA1C" in the  last 72 hours. CBG: No results for input(s): "GLUCAP" in the last 168 hours. Lipid Profile: No results for input(s): "CHOL", "HDL", "LDLCALC", "TRIG", "CHOLHDL", "LDLDIRECT" in the last 72 hours. Thyroid Function Tests: No results for input(s): "TSH", "T4TOTAL", "FREET4", "T3FREE", "THYROIDAB" in the last 72 hours. Anemia Panel: No results for input(s): "VITAMINB12", "FOLATE", "FERRITIN", "TIBC", "IRON", "RETICCTPCT" in the last 72 hours. Urine analysis:    Component Value Date/Time   COLORURINE YELLOW 07/16/2022 1855   APPEARANCEUR HAZY (A) 07/16/2022 1855   LABSPEC 1.030 07/16/2022 1855   PHURINE 7.0 07/16/2022 1855   GLUCOSEU NEGATIVE 07/16/2022 1855   HGBUR NEGATIVE 07/16/2022 1855   BILIRUBINUR NEGATIVE 07/16/2022 [place] NEGATIVE 07/16/2022 1855  PROTEINUR NEGATIVE 07/16/2022 1855   NITRITE NEGATIVE 07/16/2022 1855   LEUKOCYTESUR NEGATIVE 07/16/2022 1855    Radiological Exams on Admission: CT ABDOMEN PELVIS W CONTRAST  Result Date: 07/16/2022 CLINICAL DATA:  Left lower quadrant abdominal pain EXAM: CT ABDOMEN AND PELVIS WITH CONTRAST TECHNIQUE: Multidetector CT imaging of the abdomen and pelvis was performed using the standard protocol following bolus administration of intravenous contrast. RADIATION DOSE REDUCTION: This exam was performed according to the departmental dose-optimization program which includes automated exposure control, adjustment of the mA and/or kV according to patient size and/or use of iterative reconstruction technique. CONTRAST:  51m OMNIPAQUE IOHEXOL 300 MG/ML  SOLN COMPARISON:  CT abdomen and pelvis 07/04/2022 FINDINGS: Lower chest: There is minimal atelectasis in the lung bases. Hepatobiliary: No focal liver abnormality is seen. Status post cholecystectomy. No biliary dilatation. Pancreas: Unremarkable. No pancreatic ductal dilatation or surrounding inflammatory changes. Spleen: Normal in size without focal abnormality. Adrenals/Urinary Tract:  There are rounded cortical hypodensities in both kidneys favored as cysts. Otherwise, the kidneys, adrenal glands and bladder are within normal limits. Stomach/Bowel: Small bowel anastomosis noted in the mid abdomen. There are few dilated. Small bowel loops with air-fluid levels in the central abdomen. No definitive transition point visualized. Stomach and colon are decompressed. There is sigmoid colon diverticulosis. The appendix is not seen. There is no inflammation or pneumatosis. Vascular/Lymphatic: Aortic atherosclerosis. No enlarged abdominal or pelvic lymph nodes. Reproductive: Prostate is unremarkable. Other: No abdominal wall hernia or abnormality. No abdominopelvic ascites. Musculoskeletal: Degenerative changes affect the spine IMPRESSION: 1. Mildly dilated small bowel loops with air-fluid levels in the central abdomen. No definitive transition point visualized. Findings may represent ileus or partial small bowel obstruction. 2. Sigmoid colon diverticulosis without evidence for diverticulitis. 3. Small renal cysts.  No follow-up imaging recommended. Aortic Atherosclerosis (ICD10-I70.0). Electronically Signed   By: ARonney AstersM.D.   On: 07/16/2022 18:16    EKG: Independently reviewed.   Assessment/Plan  Ileus  - in back ground of recent admission for sbo 2/17-2/20 that resolved with conservative treatment -currently with diarrhea at home concern for possible infection with wbc 18 - admit to tele  - no n/v , + bowel sound  - npo / gut rest, no need for NG tube currently  -iv abx due to concern for infection with wbc of 18 with increase pmns -de-escalate as able  - supportive care pain medications ivfs  -surgery consult f/u in am   Gastroenteritis nos  - noted loose stools at home -gi panel pending   Dementia Severe  -resume aricept , namenda once patient taking po   DVT prophylaxis: heparin Code Status: DNR on prior admission, full code default as unable to reach family for  confirmation Family Communication: none at bedside  Disposition Plan: patient  expected to be admitted greater than 2 midnights  Consults called: surgery  Admission status: tele    SClance BollMD [facility]   If 7PM-7AM, please contact night-coverage [email or web address] Password [facility] 07/16/2022, 8:15 PM

## 2022-07-16 NOTE — ED Provider Notes (Signed)
EMERGENCY DEPARTMENT AT [facility] Provider Note   CSN: [id number] Arrival date & time: 07/16/22  1456     History  Chief Complaint  Patient presents with   Abdominal Pain    Jason Stephenson is a 77 y.o. male.  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  Patient's wife gives the history due to patient's aphasia and dementia.  She reports that when he left the hospital on the 20th he was having no abdominal pain but was having watery stools.  Those have continued but the pain started today.  She reports that he does not even realize he is having a bowel movement or urinating until after he is already gone.  They did see their doctor today and had stool sample sent but she sent him here because he was having abdominal pain that was new.  His wife reports he has not had fever, behavioral changes and he has still been eating.  He has not had any vomiting.  There is been no blood in the stool.  Patient does report that he has been having pain in the left lower quadrant.  The history is provided by the patient.  Abdominal Pain      Home Medications Prior to Admission medications   Medication Sig Start Date End Date Taking? Authorizing Provider  Cholecalciferol (VITAMIN D3) 25 MCG (1000 UT) CAPS Take 1,000 Units by mouth daily.    [provider]  donepezil (ARICEPT) 10 MG tablet Take 1 tablet (10 mg total) by mouth daily. 07/15/22   Rondel Jumbo, PA-C  finasteride (PROSCAR) 5 MG tablet Take 5 mg by mouth daily.    [provider]  memantine (NAMENDA) 10 MG tablet Take 1 tablet by mouth twice daily 06/02/22   Shawn Route, Coralee Pesa, PA-C  SEROQUEL 25 MG tablet Take 25 mg by mouth at bedtime. 06/08/22    [provider]  tadalafil (CIALIS) 5 MG tablet Take 5 mg by mouth daily.    [provider]      Allergies    Patient has no active allergies.    Review of Systems   Review of Systems  Gastrointestinal:  Positive for abdominal pain.    Physical Exam Updated Vital Signs BP (!) 169/92   Pulse (!) 52   Temp 97.8 F (36.6 C) (Oral)   Resp 19   Ht '5\' 10"'$  (1.778 m)   Wt 70 kg   SpO2 100%   BMI 22.14 kg/m  Physical Exam Vitals and nursing note reviewed.  Constitutional:      General: He is not in acute distress.    Appearance: He is well-developed.  HENT:     Head: Normocephalic and atraumatic.  Eyes:     Conjunctiva/sclera: Conjunctivae normal.     Pupils: Pupils are equal, round, and reactive to light.  Cardiovascular:     Rate and Rhythm: Regular rhythm. Bradycardia present.     Heart sounds: No murmur heard. Pulmonary:     Effort: Pulmonary effort is normal. No respiratory distress.     Breath sounds: Normal breath sounds. No wheezing or rales.  Abdominal:     General: There is no distension.     Palpations: Abdomen is  soft.     Tenderness: There is abdominal tenderness in the left lower quadrant. There is guarding. There is no rebound.  Musculoskeletal:        General: No tenderness. Normal range of motion.     Cervical back: Normal range of motion and neck supple.  Skin:    General: Skin is warm and dry.     Findings: No erythema or rash.  Neurological:     Mental Status: He is alert. Mental status is at baseline.  Psychiatric:        Mood and Affect: Mood normal.        Behavior: Behavior normal.     ED Results / Procedures / Treatments   Labs (all labs ordered are listed, but only abnormal results are displayed) Labs Reviewed  CBC WITH DIFFERENTIAL/PLATELET - Abnormal; Notable for the following components:      Result Value   WBC 17.8 (*)    Neutro Abs 14.8 (*)    All other components within normal limits  COMPREHENSIVE METABOLIC  PANEL - Abnormal; Notable for the following components:   Creatinine, Ser 1.36 (*)    Calcium 8.8 (*)    GFR, Estimated 54 (*)    All other components within normal limits  URINALYSIS, ROUTINE W REFLEX MICROSCOPIC    EKG None  Radiology CT ABDOMEN PELVIS W CONTRAST  Result Date: 07/16/2022 CLINICAL DATA:  Left lower quadrant abdominal pain EXAM: CT ABDOMEN AND PELVIS WITH CONTRAST TECHNIQUE: Multidetector CT imaging of the abdomen and pelvis was performed using the standard protocol following bolus administration of intravenous contrast. RADIATION DOSE REDUCTION: This exam was performed according to the departmental dose-optimization program which includes automated exposure control, adjustment of the mA and/or kV according to patient size and/or use of iterative reconstruction technique. CONTRAST:  50m OMNIPAQUE IOHEXOL 300 MG/ML  SOLN COMPARISON:  CT abdomen and pelvis 07/04/2022 FINDINGS: Lower chest: There is minimal atelectasis in the lung bases. Hepatobiliary: No focal liver abnormality is seen. Status post cholecystectomy. No biliary dilatation. Pancreas: Unremarkable. No pancreatic ductal dilatation or surrounding inflammatory changes. Spleen: Normal in size without focal abnormality. Adrenals/Urinary Tract: There are rounded cortical hypodensities in both kidneys favored as cysts. Otherwise, the kidneys, adrenal glands and bladder are within normal limits. Stomach/Bowel: Small bowel anastomosis noted in the mid abdomen. There are few dilated. Small bowel loops with air-fluid levels in the central abdomen. No definitive transition point visualized. Stomach and colon are decompressed. There is sigmoid colon diverticulosis. The appendix is not seen. There is no inflammation or pneumatosis. Vascular/Lymphatic: Aortic atherosclerosis. No enlarged abdominal or pelvic lymph nodes. Reproductive: Prostate is unremarkable. Other: No abdominal wall hernia or abnormality. No abdominopelvic ascites.  Musculoskeletal: Degenerative changes affect the spine IMPRESSION: 1. Mildly dilated small bowel loops with air-fluid levels in the central abdomen. No definitive transition point visualized. Findings may represent ileus or partial small bowel obstruction. 2. Sigmoid colon diverticulosis without evidence for diverticulitis. 3. Small renal cysts.  No follow-up imaging recommended. Aortic Atherosclerosis (ICD10-I70.0). Electronically Signed   By: ARonney AstersM.D.   On: 07/16/2022 18:16    Procedures Procedures    Medications Ordered in ED Medications  lactated ringers infusion (has no administration in time range)  lactated ringers bolus 1,000 mL (0 mLs Intravenous Stopped 07/16/22 1726)  fentaNYL (SUBLIMAZE) injection 25 mcg (25 mcg Intravenous Given 07/16/22 1723)  ondansetron (ZOFRAN) injection 4 mg (4 mg Intravenous Given 07/16/22 1724)  iohexol (OMNIPAQUE) 300 MG/ML solution 80 mL (80 mLs  Intravenous Contrast Given 07/16/22 1744)    ED Course/ Medical Decision Making/ A&P                             Medical Decision Making Amount and/or Complexity of Data Reviewed Independent Historian: spouse External Data Reviewed: notes.    Details: Recent hospitalization Labs: ordered. Decision-making details documented in ED Course. Radiology: ordered and independent interpretation performed. Decision-making details documented in ED Course.  Risk Prescription drug management. Decision regarding hospitalization.   Pt with multiple medical problems and comorbidities and presenting today with a complaint that caries a high risk for morbidity and mortality.  Here today with abdominal pain and consistent watery stools.  Patient was recently hospitalized and discharged 9 days ago after having a small bowel obstruction without a transition point.  Since leaving the hospital he has had issues with watery stool and incontinence of stool and urine but today developed abdominal pain.  Patient is still  having pain in the left lower quadrant concern for diverticulitis especially given his prior history versus colitis.  He does have active bowel sounds and has not had vomiting consistent with a bowel obstruction at this time.  Lower suspicion for UTI or pyelonephritis.  Patient seems comfortable while lying in bed.  Labs and imaging are pending.  6:57 PM I independently interpreted patient's labs today, CBC with leukocytosis of 17 with stable hemoglobin, CMP with normal electrolytes and no significant change in renal function.  I have independently visualized and interpreted pt's images today.  CT abdomen pelvis shows no evidence of diverticulitis or renal stones or pyelonephritis.  Radiology reports mildly dilated small bowel loops with air-fluid levels in the central abdomen with no definitive transition point concerning for ileus or partial small bowel obstruction.  Given patient's recurrent pain, persistent symptoms concern for recurring partial small bowel obstruction.  Patient already had stool sample sent today by his wife will have to follow those up to ensure no evidence of infectious diarrhea.  At this time feel that patient needs admission for bowel rest and ongoing fluids to ensure symptoms do not worsen.  Do not feel that he needs an NG tube at this time.  He was started on maintenance fluids.  Findings discussed with the patient and his wife.         Final Clinical Impression(s) / ED Diagnoses Final diagnoses:  Partial small bowel obstruction [facility])    Rx / DC Orders ED Discharge Orders     None         Blanchie Dessert, MD 07/16/22 1857

## 2022-07-16 NOTE — Progress Notes (Deleted)
Pt drank half bottle of ensure.

## 2022-07-16 NOTE — ED Triage Notes (Addendum)
Patient believes he has a bowel obstruction due to left lower quadrant pain. He had surgery last May for a small bowel obstruction. No vomiting. Pure liquid when he has a bowel movement. No blood in stool.

## 2022-07-17 ENCOUNTER — Inpatient Hospital Stay (HOSPITAL_COMMUNITY): Payer: Medicare Other

## 2022-07-17 DIAGNOSIS — K567 Ileus, unspecified: Secondary | ICD-10-CM | POA: Diagnosis not present

## 2022-07-17 LAB — COMPREHENSIVE METABOLIC PANEL
ALT: 13 U/L (ref 0–44)
AST: 15 U/L (ref 15–41)
Albumin: 3.6 g/dL (ref 3.5–5.0)
Alkaline Phosphatase: 72 U/L (ref 38–126)
Anion gap: 6 (ref 5–15)
BUN: 17 mg/dL (ref 8–23)
CO2: 27 mmol/L (ref 22–32)
Calcium: 8.5 mg/dL — ABNORMAL LOW (ref 8.9–10.3)
Chloride: 104 mmol/L (ref 98–111)
Creatinine, Ser: 1.15 mg/dL (ref 0.61–1.24)
GFR, Estimated: >60 mL/min (ref >60)
Glucose, Bld: 102 mg/dL — ABNORMAL HIGH (ref 70–99)
Potassium: 3.6 mmol/L (ref 3.5–5.1)
Sodium: 137 mmol/L (ref 135–145)
Total Bilirubin: 0.9 mg/dL (ref 0.3–1.2)
Total Protein: 6.5 g/dL (ref 6.5–8.1)

## 2022-07-17 LAB — CBC
HCT: 43.4 % (ref 39.0–52.0)
Hemoglobin: 14.7 g/dL (ref 13.0–17.0)
MCH: 31.3 pg (ref 26.0–34.0)
MCHC: 33.9 g/dL (ref 30.0–36.0)
MCV: 92.5 fL (ref 80.0–100.0)
Platelets: 316 10*3/uL (ref 150–400)
RBC: 4.69 MIL/uL (ref 4.22–5.81)
RDW: 11.8 % (ref 11.5–15.5)
WBC: 10.3 10*3/uL (ref 4.0–10.5)
nRBC: 0 % (ref 0.0–0.2)

## 2022-07-17 LAB — TSH: TSH: 4.63 u[IU]/mL — ABNORMAL HIGH (ref 0.350–4.500)

## 2022-07-17 LAB — LACTIC ACID, PLASMA: Lactic Acid, Venous: 0.7 mmol/L (ref 0.5–1.9)

## 2022-07-17 MED ORDER — QUETIAPINE FUMARATE 25 MG PO TABS
25.0000 mg | ORAL_TABLET | Freq: Every day | ORAL | Status: DC
  Administered 2022-07-17: 25 mg via ORAL
  Filled 2022-07-17: qty 1

## 2022-07-17 MED ORDER — FINASTERIDE 5 MG PO TABS
5.0000 mg | ORAL_TABLET | Freq: Every day | ORAL | Status: DC
  Administered 2022-07-17 – 2022-07-18 (×2): 5 mg via ORAL
  Filled 2022-07-17 (×2): qty 1

## 2022-07-17 NOTE — Progress Notes (Signed)
Pt is jump out the bed two time, removed tele twice and remove the external catheter once within four hours after he arrived the unit.

## 2022-07-17 NOTE — Progress Notes (Signed)
[facility] PROGRESS NOTE  Jason Stephenson (DOB: 27-Dec-1945) [id number] PCP: Jason Congress, NP Outpatient Specialists: [facility], Dr. Ninfa Stephenson  Brief Narrative: Jason Stephenson is a 77 y.o. 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 He had been having loose stools with incontinence since recent discharge, but has had no stools since this acute abdominal pain returned. In the ED he was afebrile, with leukocytosis (17.8k, up from 14.1k at recent discharge). Lactic acid normal, UA negative. CT abd/pelvis showed mildly dilated small bowel loops with air-fluid levels in the central abdomen without transition point. Sigmoid diverticulosis was noted with no colonic inflammation. IV fluids, antibiotics and pain medications were started on admission.   Subjective: Pt very confused, denies pain, has not had BM yet. Could take fluids if offered. Spouse and dog at/in bedside.   Objective: BP (!) 164/86 (BP Location: Right Arm)   Pulse (!) 55   Temp 97.8 F (36.6 C) (Oral)   Resp 18   Ht '5\' 10"'$  (1.778 m)   Wt 68.2 kg   SpO2 98%   BMI 21.57 kg/m   Gen: No distress Pulm: Clear, nonlabored  CV: RRR, no MRG or edema GI: Soft, not tender, not taut, +BS Neuro: Alert and interactive and not oriented other than person. No new focal deficits. Ext: Warm, no deformities. Skin: No rashes, lesions or ulcers on visualized skin   Abd XR: mildly dilated loops of small bowel, no TP or free air (personal interpretation)  Telemetry (personal review): Sinus bradycardia into high 40's with mild PR prolongation consistent with 1st deg AVB. BP as elevated at this time and patient sleeping.   Assessment & Plan: Ileus vs. partial SBO: CT showing air-fluid  levels in small bowel without transition point. This is recurrent symptoms. Abd exam currently reassuring. WBC normalized since admission with abx and IVF.  - Start clear liquid diet, monitor abd exam. No N/V to suggest decompression needed.  - Given leukocytosis, loose stools, recent hospitalization, we will continue empiric abx, though no significant colonic inflammation noted on CT, pending GI pathogen panel and C. diff assay result.  - Given complexity of prior surgical history, general surgery was consulted by admitting MD.  Dementia:  - Delirium precautions. Will DC telemetry to minimize restraints/interventions that could worsen delirium.  - For now, hold namenda and aricept with some mild bradycardia pending tolerance of diet.  - If tolerating po this evening, give seroquel home med.  Elevated TSH: TSH is 4.630 (ULN is 4.5) - Check free T4. Unless markedly abnormal, would defer management to PCP after discharge/convalescence.   BPH:  - Restart finasteride  Jason Pour, MD [facility] [email or web address] 07/17/2022, 12:32 PM

## 2022-07-17 NOTE — Progress Notes (Signed)
  Transition of Care [facility]) Screening Note   Patient Details  Name: Jason Stephenson Date of Birth: 12-Jan-1946   Transition of Care [facility]) CM/SW Contact:    Henrietta Dine, RN Phone Number: 07/17/2022, 9:15 AM    Transition of Care Department [facility]) has reviewed patient and no TOC needs have been identified at this time. We will continue to monitor patient advancement through interdisciplinary progression rounds. If new patient transition needs arise, please place a TOC consult.

## 2022-07-17 NOTE — Plan of Care (Signed)
  Problem: Education: Goal: Knowledge of General Education information will improve Description Including pain rating scale, medication(s)/side effects and non-pharmacologic comfort measures Outcome: Progressing   Problem: Health Behavior/Discharge Planning: Goal: Ability to manage health-related needs will improve Outcome: Progressing   

## 2022-07-18 ENCOUNTER — Inpatient Hospital Stay (HOSPITAL_COMMUNITY): Payer: Medicare Other

## 2022-07-18 DIAGNOSIS — K567 Ileus, unspecified: Secondary | ICD-10-CM | POA: Diagnosis not present

## 2022-07-18 LAB — T4, FREE: Free T4: 1.02 ng/dL (ref 0.61–1.12)

## 2022-07-18 LAB — BASIC METABOLIC PANEL
Anion gap: 10 (ref 5–15)
BUN: 14 mg/dL (ref 8–23)
CO2: 24 mmol/L (ref 22–32)
Calcium: 8.6 mg/dL — ABNORMAL LOW (ref 8.9–10.3)
Chloride: 105 mmol/L (ref 98–111)
Creatinine, Ser: 1.03 mg/dL (ref 0.61–1.24)
GFR, Estimated: >60 mL/min (ref >60)
Glucose, Bld: 82 mg/dL (ref 70–99)
Potassium: 3.7 mmol/L (ref 3.5–5.1)
Sodium: 139 mmol/L (ref 135–145)

## 2022-07-18 LAB — C DIFFICILE QUICK SCREEN W PCR REFLEX
C Diff antigen: NEGATIVE
C Diff interpretation: NOT DETECTED
C Diff toxin: NEGATIVE

## 2022-07-18 LAB — HEMOGLOBIN A1C
Hgb A1c MFr Bld: 5.2 % (ref 4.8–5.6)
Mean Plasma Glucose: 103 mg/dL

## 2022-07-18 LAB — GLUCOSE, CAPILLARY: Glucose-Capillary: 94 mg/dL (ref 70–99)

## 2022-07-18 MED ORDER — HYDRALAZINE HCL 20 MG/ML IJ SOLN: 10.0000 mg | INTRAMUSCULAR | Status: DC | PRN

## 2022-07-18 NOTE — Discharge Instructions (Signed)
If your symptoms return, you develop a fever, inability to tolerate food/fluids, or bleeding seek medical attention right away. Otherwise, follow up with your PCP and/or [facility] [place] surgery for ongoing care.

## 2022-07-18 NOTE — Discharge Summary (Signed)
Physician Discharge Summary   Patient: Jason Stephenson MRN: [id number] DOB: 05/30/1945  Admit date:     07/16/2022  Discharge date: 07/18/22  Discharge Physician: Patrecia Pour   PCP: Faustino Congress, NP   Recommendations at discharge:  Follow up with PCP in 1-2 weeks, consider recheck TSH, free T4 at follow up. Follow up with [facility] as per routine or earlier as needed  Discharge Diagnoses: Principal Problem:   Ileus [facility])  Hospital Course: Jason Stephenson is a 77 y.o. 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 He had been having loose stools with incontinence since recent discharge, but has had no stools since this acute abdominal pain returned. In the ED he was afebrile, with leukocytosis (17.8k, up from 14.1k at recent discharge). Lactic acid normal, UA negative. CT abd/pelvis showed mildly dilated small bowel loops with air-fluid levels in the central abdomen without transition point. Sigmoid diverticulosis was noted with no colonic inflammation. IV fluids, antibiotics and pain medications were started on admission.   Hospitalization complicated by agitated delirium requiring a sitter overnight.   The patient has had a bowel movement which tested negative for C. diff and has tolerated and advanced diet, having eaten all of breakfast including french toast. Reports no pain, has no tenderness on exam.  Assessment and Plan: Ileus vs. partial SBO: CT showing air-fluid levels in small bowel without transition point. Abd exam remains reassuring, tolerating po and having BMs, ileus clinically resolved. WBC normalized since admission. C. diff negative.    Dementia:  - Restart home medications, abbreviate hospitalization as much as possible.    Elevated TSH: TSH is 4.630 (ULN is 4.5), free T4 normal at 1.02.  - Suggest recheck in 4-6 weeks, defer management to PCP after discharge/convalescence.    BPH:  - Restart finasteride  Consultants: Surgery Procedures performed: None  Disposition: Home Diet recommendation: Regular/soft as tolerated DISCHARGE MEDICATION: Allergies as of 07/18/2022   No Known Allergies      Medication List     TAKE these medications    donepezil 10 MG tablet Commonly known as: ARICEPT Take 1 tablet (10 mg total) by mouth daily. What changed: when to take this   finasteride 5 MG tablet Commonly known as: PROSCAR Take 5 mg by mouth daily.   memantine 10 MG tablet Commonly known as: NAMENDA Take 1 tablet by mouth twice daily What changed:  when to take this additional instructions   SEROquel 25 MG tablet Generic drug: QUEtiapine Take 25 mg by mouth at bedtime.   tadalafil 5 MG tablet Commonly known as: CIALIS Take 5 mg by mouth daily.   Vitamin D3 50 MCG (2000 UT) Tabs Generic drug: Cholecalciferol Take 2,000 Units by mouth daily with breakfast.        Follow-up Information     Faustino Congress, NP Follow up.   Specialty: Family Medicine Contact information: [place] [address] [phone]         Michael Boston, MD Follow up.   Specialties: General Surgery, Colon and Rectal Surgery               Discharge Exam: Filed Weights   07/16/22 1503 07/16/22 2014 07/17/22 0536  Weight: 70 kg 68.4 kg 68.2 kg  BP (!) 171/90 (BP Location: Right Arm)  Pulse (!) 45   Temp 97.7 F (36.5 C) (Axillary)   Resp 20   Ht '5\' 10"'$  (1.778 m)   Wt 68.2 kg   SpO2 96%   BMI 21.57 kg/m   No distress, alert and interactive not oriented MAEW RRR, no MRG Clear, nonlabored +BS, soft and nontender throughout. No distention or guarding.  Condition at discharge: stable  The results of significant diagnostics from this hospitalization (including imaging,  microbiology, ancillary and laboratory) are listed below for reference.   Imaging Studies: DG Abd 1 View  Result Date: 07/17/2022 CLINICAL DATA:  Ileus EXAM: ABDOMEN - 1 VIEW COMPARISON:  CT 07/16/2022 FINDINGS: There is mild gaseous distension of bowel with focal mild small bowel dilation of the left upper quadrant, similar to recent CT. No radiopaque calculi overlie the kidneys. Right upper quadrant surgical clips. Degenerative changes of the spine. No acute osseous abnormality. Bone island in the left upper iliac bone. IMPRESSION: Mild gaseous distension of bowel with focal mild small bowel dilation in the left upper quadrant, similar to recent CT. Electronically Signed   By: Maurine Simmering M.D.   On: 07/17/2022 11:36   CT ABDOMEN PELVIS W CONTRAST  Result Date: 07/16/2022 CLINICAL DATA:  Left lower quadrant abdominal pain EXAM: CT ABDOMEN AND PELVIS WITH CONTRAST TECHNIQUE: Multidetector CT imaging of the abdomen and pelvis was performed using the standard protocol following bolus administration of intravenous contrast. RADIATION DOSE REDUCTION: This exam was performed according to the departmental dose-optimization program which includes automated exposure control, adjustment of the mA and/or kV according to patient size and/or use of iterative reconstruction technique. CONTRAST:  20m OMNIPAQUE IOHEXOL 300 MG/ML  SOLN COMPARISON:  CT abdomen and pelvis 07/04/2022 FINDINGS: Lower chest: There is minimal atelectasis in the lung bases. Hepatobiliary: No focal liver abnormality is seen. Status post cholecystectomy. No biliary dilatation. Pancreas: Unremarkable. No pancreatic ductal dilatation or surrounding inflammatory changes. Spleen: Normal in size without focal abnormality. Adrenals/Urinary Tract: There are rounded cortical hypodensities in both kidneys favored as cysts. Otherwise, the kidneys, adrenal glands and bladder are within normal limits. Stomach/Bowel: Small bowel anastomosis noted in the mid  abdomen. There are few dilated. Small bowel loops with air-fluid levels in the central abdomen. No definitive transition point visualized. Stomach and colon are decompressed. There is sigmoid colon diverticulosis. The appendix is not seen. There is no inflammation or pneumatosis. Vascular/Lymphatic: Aortic atherosclerosis. No enlarged abdominal or pelvic lymph nodes. Reproductive: Prostate is unremarkable. Other: No abdominal wall hernia or abnormality. No abdominopelvic ascites. Musculoskeletal: Degenerative changes affect the spine IMPRESSION: 1. Mildly dilated small bowel loops with air-fluid levels in the central abdomen. No definitive transition point visualized. Findings may represent ileus or partial small bowel obstruction. 2. Sigmoid colon diverticulosis without evidence for diverticulitis. 3. Small renal cysts.  No follow-up imaging recommended. Aortic Atherosclerosis (ICD10-I70.0). Electronically Signed   By: ARonney AstersM.D.   On: 07/16/2022 18:16   DG Abd Portable 1V  Result Date: 07/06/2022 CLINICAL DATA:  Small bowel obstruction. EXAM: PORTABLE ABDOMEN - 1 VIEW COMPARISON:  07/05/2022 FINDINGS: Nasogastric tube is stable in left upper quadrant of the abdomen and appears to be in the stomach body. The oral contrast has moved from the stomach to the colon. There is oral contrast throughout the colon. Decreased small bowel gaseous distension. IMPRESSION: 1. Decreased small bowel gaseous distension and there is now oral contrast throughout the colon. No evidence for bowel obstruction at this time. Electronically Signed   By: AQuita Skye  Anselm Pancoast M.D.   On: 07/06/2022 09:17   DG Abd 1 View  Result Date: 07/05/2022 CLINICAL DATA:  Check gastric catheter placement EXAM: ABDOMEN - 1 VIEW COMPARISON:  Film from earlier in the same day. FINDINGS: Gastric catheter is been reinserted. Contrast material lies in the stomach. Stable small bowel dilatation is seen. IMPRESSION: Gastric catheter within the stomach.  Electronically Signed   By: Inez Catalina M.D.   On: 07/05/2022 01:23   DG Abd Portable 1V-Small Bowel Obstruction Protocol-initial, 8 hr delay  Result Date: 07/05/2022 CLINICAL DATA:  Small-bowel obstruction protocol, 8 hour delay. EXAM: PORTABLE ABDOMEN - 1 VIEW COMPARISON:  None Available. FINDINGS: Multiple loops of distended small bowel are present in the abdomen measuring up to 5.6 cm. Contrast is present in the stomach. No contrast is seen in the small bowel at this time. Cholecystectomy clips are present in the right upper quadrant. No radio-opaque calculi or other acute radiographic abnormality are seen. Excreted contrast is present in the urinary bladder. IMPRESSION: Persistent dilated loops of small bowel in the abdomen measuring up to 5.6 cm, concerning for small bowel obstruction. Contrast has not progressed beyond the uterine fundus at this time. Electronically Signed   By: Brett Fairy M.D.   On: 07/05/2022 00:25   DG Abd Portable 1V  Result Date: 07/04/2022 CLINICAL DATA:  Confirm NG tube placement after advancement. EXAM: PORTABLE ABDOMEN - 1 VIEW COMPARISON:  Earlier same day radiograph at 1350 hours; CT abdomen pelvis at 0852 hours FINDINGS: Interval advancement of the nasogastric tube with the tip overlying the upper gastric body. Oral contrast has been injected and projects over the gastric fundus. Several loops of dilated bowel measuring up to 5.5 cm in diameter are seen in the mid and lower abdomen. Surgical clips overlie the right upper abdomen. Visualized lower lung fields are clear. IMPRESSION: Interval advancement of the nasogastric tube with the tip overlying the upper gastric body. Electronically Signed   By: Ileana Roup M.D.   On: 07/04/2022 18:15   DG Abd Portable 1V-Small Bowel Protocol-Position Verification  Result Date: 07/04/2022 CLINICAL DATA:  NGT placement. EXAM: PORTABLE ABDOMEN - 1 VIEW COMPARISON:  10/11/2021. FINDINGS: Image of the upper abdomen demonstrates NG  tube tip superimposed with the stomach. Side port may be in the distal esophagus and the tube should be advanced about 7 cm. IMPRESSION: Side port may be in the distal esophagus, and the tube should be advanced about 7 cm. Electronically Signed   By: Sammie Bench M.D.   On: 07/04/2022 14:05   CT Abdomen Pelvis W Contrast  Result Date: 07/04/2022 CLINICAL DATA:  Abdominal pain EXAM: CT ABDOMEN AND PELVIS WITH CONTRAST TECHNIQUE: Multidetector CT imaging of the abdomen and pelvis was performed using the standard protocol following bolus administration of intravenous contrast. RADIATION DOSE REDUCTION: This exam was performed according to the departmental dose-optimization program which includes automated exposure control, adjustment of the mA and/or kV according to patient size and/or use of iterative reconstruction technique. CONTRAST:  168m OMNIPAQUE IOHEXOL 300 MG/ML  SOLN COMPARISON:  10/08/2021 FINDINGS: Lower chest: Atelectasis is identified within the posterior lung bases. No pleural fluid or airspace consolidation. Hepatobiliary: No focal liver lesion identified. Status post cholecystectomy. Mild intrahepatic bile duct dilatation and common bile duct dilatation noted. The CBD measures up to 1.1 cm, image 35/4. Previously 1 cm. Pancreas: Unremarkable. No pancreatic ductal dilatation or surrounding inflammatory changes. Spleen: Normal in size without focal abnormality. Adrenals/Urinary Tract: Normal adrenal glands. No hydronephrosis,  nephrolithiasis or suspicious kidney mass. Urinary bladder appears normal. The Stomach/Bowel: Small hiatal hernia. Beyond the level of the ligament of Treitz there is abnormal small bowel dilatation with multiple air-fluid levels. Small bowel loops measure up to 4.7 cm in diameter, image 19/4. Signs of interval small-bowel resection with entero enteric anastomosis noted in the right upper quadrant of the abdomen. Adjacent to the anastomotic suture line there is a short  segment of abnormal small bowel wall thickening with surrounding soft tissue stranding, image 46/2. The distal small bowel loops are nondilated. Imaging findings are concerning for at small bowel obstruction. The exact transition point is difficult to identified with a high degree of certainty. Normal caliber of the colon. Moderate stool burden is noted within the colon up to the level of the rectum. Sigmoid diverticulosis without signs of acute diverticulitis. Vascular/Lymphatic: Aortic atherosclerosis. No aneurysm. No signs of abdominopelvic adenopathy. Reproductive: Mild prostate gland enlargement. Other: Small volume of ascites identified within the right upper quadrant of the abdomen extending over left for and in the dependent portion of the pelvis. No discrete fluid collections identified. No signs of pneumoperitoneum. Musculoskeletal: Spondylosis identified within the thoracic spine. 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 Anterolisthesis of L5 on S1 measures 8 mm. Multilevel lumbar degenerative disc disease. IMPRESSION: 1. Imaging findings concerning for small bowel obstruction. There is abnormal dilatation of the proximal and mid small bowel loops with decreased caliber distal small bowel. Multiple air-fluid levels are identified. Exact transition point is difficult to identify with a high degree of certainty. 2. Signs of small bowel resection with anastomotic suture chain identified in the right upper quadrant of the abdomen. Bowel loop adjacent to the anastomosis exhibits abnormal wall thickening concerning for focal enteritis. 3. Small volume of ascites. No free air or focal fluid collections identified. 4. Status post cholecystectomy with increase in caliber of the common bile duct and intrahepatic bile ducts. 5. Moderate stool burden noted within the colon up to the level of the rectum. 6. Aortic Atherosclerosis (ICD10-I70.0). Electronically Signed   By: Kerby Moors M.D.   On: 07/04/2022 09:40     Microbiology: Results for orders placed or performed during the hospital encounter of 07/16/22  C Difficile Quick Screen w PCR reflex     Status: None   Collection Time: 07/17/22 10:52 PM   Specimen: STOOL  Result Value Ref Range Status   C Diff antigen NEGATIVE NEGATIVE Final   C Diff toxin NEGATIVE NEGATIVE Final   C Diff interpretation No C. difficile detected.  Final    Comment: Performed at [facility], [place] [address] [place], [place] [address]    Labs: CBC: Recent Labs  Lab 07/16/22 1510 07/17/22 0000  WBC 17.8* 10.3  NEUTROABS 14.8*  --   HGB 15.9 14.7  HCT 47.0 43.4  MCV 94.0 92.5  PLT 348 [id number]   Basic Metabolic Panel: Recent Labs  Lab 07/16/22 1510 07/17/22 0000 07/18/22 0348  NA 142 137 139  K 3.7 3.6 3.7  CL 105 104 105  CO2 '30 27 24  '$ GLUCOSE 96 102* 82  BUN '22 17 14  '$ CREATININE 1.36* 1.15 1.03  CALCIUM 8.8* 8.5* 8.6*   Liver Function Tests: Recent Labs  Lab 07/16/22 1510 07/17/22 0000  AST 15 15  ALT 15 13  ALKPHOS 71 72  BILITOT 0.7 0.9  PROT 7.3 6.5  ALBUMIN 4.1 3.6   CBG: Recent Labs  Lab 07/18/22 0748  GLUCAP 94    Discharge  time spent: greater than 30 minutes.  Signed: Patrecia Pour, MD [facility] 07/18/2022

## 2022-07-21 DIAGNOSIS — R197 Diarrhea, unspecified: Secondary | ICD-10-CM | POA: Diagnosis not present

## 2022-07-21 DIAGNOSIS — F5104 Psychophysiologic insomnia: Secondary | ICD-10-CM | POA: Diagnosis not present

## 2022-07-21 DIAGNOSIS — R159 Full incontinence of feces: Secondary | ICD-10-CM | POA: Diagnosis not present

## 2022-07-21 DIAGNOSIS — G308 Other Alzheimer's disease: Secondary | ICD-10-CM | POA: Diagnosis not present

## 2022-07-29 DIAGNOSIS — K080 Exfoliation of teeth due to systemic causes: Secondary | ICD-10-CM | POA: Diagnosis not present

## 2022-08-20 DIAGNOSIS — N3946 Mixed incontinence: Secondary | ICD-10-CM | POA: Diagnosis not present

## 2022-08-20 DIAGNOSIS — Z6822 Body mass index (BMI) 22.0-22.9, adult: Secondary | ICD-10-CM | POA: Diagnosis not present

## 2022-08-20 DIAGNOSIS — R4689 Other symptoms and signs involving appearance and behavior: Secondary | ICD-10-CM | POA: Diagnosis not present

## 2022-09-01 DIAGNOSIS — Z6822 Body mass index (BMI) 22.0-22.9, adult: Secondary | ICD-10-CM | POA: Diagnosis not present

## 2022-09-01 DIAGNOSIS — F5104 Psychophysiologic insomnia: Secondary | ICD-10-CM | POA: Diagnosis not present

## 2022-09-24 DIAGNOSIS — R32 Unspecified urinary incontinence: Secondary | ICD-10-CM | POA: Diagnosis not present

## 2022-09-24 DIAGNOSIS — A045 Campylobacter enteritis: Secondary | ICD-10-CM | POA: Diagnosis not present

## 2022-09-24 DIAGNOSIS — R159 Full incontinence of feces: Secondary | ICD-10-CM | POA: Diagnosis not present

## 2022-10-30 ENCOUNTER — Other Ambulatory Visit: Payer: Self-pay | Admitting: Physician Assistant

## 2022-11-11 ENCOUNTER — Encounter: Payer: Self-pay | Admitting: Physician Assistant

## 2022-11-11 ENCOUNTER — Ambulatory Visit: Payer: Medicare Other | Admitting: Physician Assistant

## 2022-11-11 VITALS — BP 128/63 | HR 60 | Resp 18 | Ht 70.0 in | Wt 159.0 lb

## 2022-11-11 DIAGNOSIS — F0280 Dementia in other diseases classified elsewhere without behavioral disturbance: Secondary | ICD-10-CM | POA: Diagnosis not present

## 2022-11-11 DIAGNOSIS — G309 Alzheimer's disease, unspecified: Secondary | ICD-10-CM

## 2022-11-11 DIAGNOSIS — R4701 Aphasia: Secondary | ICD-10-CM | POA: Diagnosis not present

## 2022-11-11 NOTE — Patient Instructions (Signed)
It was a pleasure to see you today at our office.   Recommendations:  Follow up in 6 months Continue donepezil 10 mg daily. Side effects were discussed  Continue Memantine 10 mg twice daily.Side effects were discussed   Whom to call:  Memory  decline, memory medications: Call our office [phone]   For psychiatric meds, mood meds: Please have your primary care physician manage these medications.      For assessment of decision of mental capacity and competency:  Call Dr. Michelle Haber, geriatric psychiatrist at [phone]  For guidance in geriatric dementia issues please call [facility] [phone]     If you have any severe symptoms of a stroke, or other severe issues such as confusion,severe chills or fever, etc call 911 or go to the ER as you may need to be evaluated further       RECOMMENDATIONS FOR ALL PATIENTS WITH MEMORY PROBLEMS: 1. Continue to exercise (Recommend 30 minutes of walking everyday, or 3 hours every week) 2. Increase social interactions - continue going to Church and enjoy social gatherings with friends and family 3. Eat healthy, avoid fried foods and eat more fruits and vegetables 4. Maintain adequate blood pressure, blood sugar, and blood cholesterol level. Reducing the risk of stroke and cardiovascular disease also helps promoting better memory. 5. Avoid stressful situations. Live a simple life and avoid aggravations. Organize your time and prepare for the next day in anticipation. 6. Sleep well, avoid any interruptions of sleep and avoid any distractions in the bedroom that may interfere with adequate sleep quality 7. Avoid sugar, avoid sweets as there is a strong link between excessive sugar intake, diabetes, and cognitive impairment We discussed the Mediterranean diet, which has been shown to help patients reduce the risk of progressive memory disorders and reduces cardiovascular risk. This includes eating fish, eat fruits and green  leafy vegetables, nuts like almonds and hazelnuts, walnuts, and also use olive oil. Avoid fast foods and fried foods as much as possible. Avoid sweets and sugar as sugar use has been linked to worsening of memory function.  There is always a concern of gradual progression of memory problems. If this is the case, then we may need to adjust level of care according to patient needs. Support, both to the patient and caregiver, should then be put into place.    The [employer] Association is here all day, every day for people facing Alzheimer's disease through our free 24/7 Helpline: [phone]. The Helpline provides reliable information and support to all those who need assistance, such as individuals living with memory loss, Alzheimer's or other dementia, caregivers, health care professionals and the public.  Our highly trained and knowledgeable staff can help you with: Understanding memory loss, dementia and Alzheimer's  Medications and other treatment options  General information about aging and brain health  Skills to provide quality care and to find the best care from professionals  Legal, financial and living-arrangement decisions Our Helpline also features: Confidential care consultation provided by master's level clinicians who can help with decision-making support, crisis assistance and education on issues families face every day  Help in a caller's preferred language using our translation service that features more than 200 languages and dialects  Referrals to local community programs, services and ongoing support     FALL PRECAUTIONS: Be cautious when walking. Scan the area for obstacles that may increase the risk of trips and falls. When getting up in the mornings, sit up at the edge   of the bed for a few minutes before getting out of bed. Consider elevating the bed at the head end to avoid drop of blood pressure when getting up. Walk always in a well-lit room (use night lights in the  walls). Avoid area rugs or power cords from appliances in the middle of the walkways. Use a walker or a cane if necessary and consider physical therapy for balance exercise. Get your eyesight checked regularly.  FINANCIAL OVERSIGHT: Supervision, especially oversight when making financial decisions or transactions is also recommended.  HOME SAFETY: Consider the safety of the kitchen when operating appliances like stoves, microwave oven, and blender. Consider having supervision and share cooking responsibilities until no longer able to participate in those. Accidents with firearms and other hazards in the house should be identified and addressed as well.   ABILITY TO BE LEFT ALONE: If patient is unable to contact 911 operator, consider using LifeLine, or when the need is there, arrange for someone to stay with patients. Smoking is a fire hazard, consider supervision or cessation. Risk of wandering should be assessed by caregiver and if detected at any point, supervision and safe proof recommendations should be instituted.  MEDICATION SUPERVISION: Inability to self-administer medication needs to be constantly addressed. Implement a mechanism to ensure safe administration of the medications.   DRIVING: Regarding driving, in patients with progressive memory problems, driving will be impaired. We advise to have someone else do the driving if trouble finding directions or if minor accidents are reported. Independent driving assessment is available to determine safety of driving.   If you are interested in the driving assessment, you can contact the following:  [employer] in [place] [phone]  [facility] [phone]  [facility] [phone]  [facility] [phone] or [phone]      Mediterranean Diet A Mediterranean diet refers to food and lifestyle choices that are based on the traditions of countries located on the [place]  Sea. This way of eating has been shown to help prevent certain conditions and improve outcomes for people who have chronic diseases, like kidney disease and heart disease. What are tips for following this plan? Lifestyle  Cook and eat meals together with your family, when possible. Drink enough fluid to keep your urine clear or pale yellow. Be physically active every day. This includes: Aerobic exercise like running or swimming. Leisure activities like gardening, walking, or housework. Get 7-8 hours of sleep each night. If recommended by your health care provider, drink red wine in moderation. This means 1 glass a day for nonpregnant women and 2 glasses a day for men. A glass of wine equals 5 oz (150 mL). Reading food labels  Check the serving size of packaged foods. For foods such as rice and pasta, the serving size refers to the amount of cooked product, not dry. Check the total fat in packaged foods. Avoid foods that have saturated fat or trans fats. Check the ingredients list for added sugars, such as corn syrup. Shopping  At the grocery store, buy most of your food from the areas near the walls of the store. This includes: Fresh fruits and vegetables (produce). Grains, beans, nuts, and seeds. Some of these may be available in unpackaged forms or large amounts (in bulk). Fresh seafood. Poultry and eggs. Low-fat dairy products. Buy whole ingredients instead of prepackaged foods. Buy fresh fruits and vegetables in-season from local farmers markets. Buy frozen fruits and vegetables in resealable bags. If you do not have access   to quality fresh seafood, buy precooked frozen shrimp or canned fish, such as tuna, salmon, or sardines. Buy small amounts of raw or cooked vegetables, salads, or olives from the deli or salad bar at your store. Stock your pantry so you always have certain foods on hand, such as olive oil, canned tuna, canned tomatoes, rice, pasta, and beans. Cooking  Cook foods  with extra-virgin olive oil instead of using butter or other vegetable oils. Have meat as a side dish, and have vegetables or grains as your main dish. This means having meat in small portions or adding small amounts of meat to foods like pasta or stew. Use beans or vegetables instead of meat in common dishes like chili or lasagna. Experiment with different cooking methods. Try roasting or broiling vegetables instead of steaming or sauteing them. Add frozen vegetables to soups, stews, pasta, or rice. Add nuts or seeds for added healthy fat at each meal. You can add these to yogurt, salads, or vegetable dishes. Marinate fish or vegetables using olive oil, lemon juice, garlic, and fresh herbs. Meal planning  Plan to eat 1 vegetarian meal one day each week. Try to work up to 2 vegetarian meals, if possible. Eat seafood 2 or more times a week. Have healthy snacks readily available, such as: Vegetable sticks with hummus. Greek yogurt. Fruit and nut trail mix. Eat balanced meals throughout the week. This includes: Fruit: 2-3 servings a day Vegetables: 4-5 servings a day Low-fat dairy: 2 servings a day Fish, poultry, or lean meat: 1 serving a day Beans and legumes: 2 or more servings a week Nuts and seeds: 1-2 servings a day Whole grains: 6-8 servings a day Extra-virgin olive oil: 3-4 servings a day Limit red meat and sweets to only a few servings a month What are my food choices? Mediterranean diet Recommended Grains: Whole-grain pasta. Brown rice. Bulgar wheat. Polenta. Couscous. Whole-wheat bread. Oatmeal. Quinoa. Vegetables: Artichokes. Beets. Broccoli. Cabbage. Carrots. Eggplant. Green beans. Chard. Kale. Spinach. Onions. Leeks. Peas. Squash. Tomatoes. Peppers. Radishes. Fruits: Apples. Apricots. Avocado. Berries. Bananas. Cherries. Dates. Figs. Grapes. Lemons. Melon. Oranges. Peaches. Plums. Pomegranate. Meats and other protein foods: Beans. Almonds. Sunflower seeds. Pine nuts. Peanuts.  Cod. Salmon. Scallops. Shrimp. Tuna. Tilapia. Clams. Oysters. Eggs. Dairy: Low-fat milk. Cheese. Greek yogurt. Beverages: Water. Red wine. Herbal tea. Fats and oils: Extra virgin olive oil. Avocado oil. Grape seed oil. Sweets and desserts: Greek yogurt with honey. Baked apples. Poached pears. Trail mix. Seasoning and other foods: Basil. Cilantro. Coriander. Cumin. Mint. Parsley. Sage. Rosemary. Tarragon. Garlic. Oregano. Thyme. Pepper. Balsalmic vinegar. Tahini. Hummus. Tomato sauce. Olives. Mushrooms. Limit these Grains: Prepackaged pasta or rice dishes. Prepackaged cereal with added sugar. Vegetables: Deep fried potatoes (french fries). Fruits: Fruit canned in syrup. Meats and other protein foods: Beef. Pork. Lamb. Poultry with skin. Hot dogs. Bacon. Dairy: Ice cream. Sour cream. Whole milk. Beverages: Juice. Sugar-sweetened soft drinks. Beer. Liquor and spirits. Fats and oils: Butter. Canola oil. Vegetable oil. Beef fat (tallow). Lard. Sweets and desserts: Cookies. Cakes. Pies. Candy. Seasoning and other foods: Mayonnaise. Premade sauces and marinades. The items listed may not be a complete list. Talk with your dietitian about what dietary choices are right for you. Summary The Mediterranean diet includes both food and lifestyle choices. Eat a variety of fresh fruits and vegetables, beans, nuts, seeds, and whole grains. Limit the amount of red meat and sweets that you eat. Talk with your health care provider about whether it is safe for you to drink   red wine in moderation. This means 1 glass a day for nonpregnant women and 2 glasses a day for men. A glass of wine equals 5 oz (150 mL). This information is not intended to replace advice given to you by your health care provider. Make sure you discuss any questions you have with your health care provider. Document Released: 12/26/2015 Document Revised: 01/28/2016 Document Reviewed: 12/26/2015 Elsevier Interactive Patient Education  2017  Elsevier Inc.     

## 2022-11-11 NOTE — Progress Notes (Signed)
Assessment/Plan:   Dementia likely due to Alzheimer's disease  Nonfluent aphasia  Jason Stephenson is a very pleasant 77 y.o. RH male with a history of hyperparathyroidism, peripheral neuropathy, moderate dementia likely due to Alzheimer's disease, moderate nonfluent aphasia seen today in follow up for memory loss. Patient is currently on memantine 10 mg twice daily and donepezil 10 mg daily.  Overall, the patient's memory and aphasia are stable.  The patient is able to participate in his ADLs to his ability, continues to exercise.  His wife is always with him, monitoring 24/7 for safety.  He no longer drives.  His mood is good.    Follow up in 6 months. Continue memantine 10 mg twice daily and donepezil 10 mg daily Recommend good control of cardiovascular risk factors Continue to control mood as per PCP    Subjective:    This patient is accompanied in the office by his wife who supplements the history.  Previous records as well as any outside records available were reviewed prior to todays visit. Patient was last seen on 05/15/2022   Any changes in memory since last visit? " About the same" He has difficulty with both short-term and long-term memory ""some days are better than others ".  He has some difficulty with recent conversations and names of people.  His aphasia is stable. repeats oneself?  Endorsed Disoriented when walking into a room?  Patient denies    Leaving objects in unusual places?  May misplace things but not in unusual places   Wandering behavior?  denies   Any personality changes since last visit?  denies   Any worsening depression?:  denies   Hallucinations or paranoia?  denies   Seizures? denies    Any sleep changes? "Sleeps great . Denies vivid dreams, REM behavior or sleepwalking   Sleep apnea?   denies   Any hygiene concerns? denies  Independent of bathing and dressing?  Endorsed  Does the patient needs help with medications?  Wife is in charge  Who  is in charge of the finances?  Wife is in charge    Any changes in appetite?  denies    Patient have trouble swallowing? denies   Does the patient cook? No Any headaches?   denies   Chronic back pain  denies   Ambulates with difficulty? denies   Recent falls or head injuries? denies     Unilateral weakness, numbness or tingling? denies   Any tremors?  denies   Any anosmia?  Patient denies   Any incontinence of urine?  Endorsed. Wears diapers Any bowel dysfunction?   He was seen in the hospital for SBO in February 2024, now no control of stools.   Patient lives with his wife.  Does the patient drive? No longer drives      "INITIAL HISTORY OF PRESENT ILLNESS: This is a 77 year old right-handed man with a history of hyperparathyroidism s/p parathyroidectomy, neuropathy, presenting for evaluation of dementia. He is accompanied by his friend of 60 years, Jason Stephenson, who helps provide additional information. He feels his memory is pretty good. He has been living with his girlfriend for the past 1.5 years. He denies getting lost driving, missing medications or bill payments. He reports his girlfriend does not need to remind him of medications. She does most of the cooking. Jason Stephenson got back in touch with him when he moved back to [place] 3 years ago and noticed memory changes at that time. Over the past 2  years, the memory lapses have progressed a little more to where he gets a little confused with issues and has trouble getting his sentences out. Jason Stephenson has noticed he has difficulty composing a sentence, needing to stop and think. Jason Stephenson has noticed he accumulates a lot of papers that they have tried to clean up, Jason Stephenson thinks this is because he would forget and needs to look back on paperwork. Jason Stephenson has helped him get plugged in with a PCP and arrange estate planning and HCPOA. Jason Stephenson expressed concern about his driving with recognizing stoplights and time. He repeatedly says he does well with driving. No  hygiene concerns. He has been on Donepezil 10mg  daily for a year, no side effects. Jason Stephenson has not noticed any personality changes, paranoia or hallucinations. He states his mood is okay. He exercises regularly in the [employer]. There is no family history of dementia, no history of significant head injuries. He drinks alcohol on occasion.    He denies any headaches, dizziness, diplopia, dysarthria, dysphagia, neck/back pain, focal numbness/tingling/weakness, bowel/bladder dysfunction. No anosmia, tremors, no falls. Sleep is good.    Personally reviewed MRI brain with and without contrast done January 2020 no acute changes, there is moderate diffuse atrophy, minimal chronic microvascular disease   PREVIOUS MEDICATIONS:   CURRENT MEDICATIONS:  Outpatient Encounter Medications as of 11/11/2022  Medication Sig   Cholecalciferol (VITAMIN D3) 50 MCG (2000 UT) TABS Take 2,000 Units by mouth daily with breakfast.   donepezil (ARICEPT) 10 MG tablet Take 1 tablet (10 mg total) by mouth daily. (Patient taking differently: Take 10 mg by mouth in the morning.)   finasteride (PROSCAR) 5 MG tablet Take 5 mg by mouth daily.   memantine (NAMENDA) 10 MG tablet Take 1 tablet by mouth twice daily   SEROQUEL 25 MG tablet Take 25 mg by mouth at bedtime.   tadalafil (CIALIS) 5 MG tablet Take 5 mg by mouth daily.   No facility-administered encounter medications on file as of 11/11/2022.       03/15/2020    9:00 AM  MMSE - Mini Mental State Exam  Orientation to time 0  Orientation to Place 1  Registration 3  Attention/ Calculation 0  Recall 3  Language- name 2 objects 2  Language- repeat 0  Language- follow 3 step command 0  Language- read & follow direction 0  Write a sentence 0  Copy design 0  Total score 9       No data to display          Objective:     PHYSICAL EXAMINATION:    VITALS:   Vitals:   11/11/22 1047  BP: 128/63  Pulse: 60  Resp: 18  SpO2: 97%  Weight: 159 lb (72.1  kg)  Height: 5\' 10"  (1.778 m)    GEN:  The patient appears stated age and is in NAD. HEENT:  Normocephalic, atraumatic.   Neurological examination:  General: NAD, well-groomed, appears stated age. Orientation: The patient is alert. Oriented to person, place and date Cranial nerves: There is good facial symmetry.The speech is not fluent but clear.  Mild aphasia without dysarthria. Fund of knowledge reduced. Recent and remote memory are impaired. Attention and concentration are reduced.  Able to name objects and repeat phrases.  Hearing is intact to conversational tone.   Sensation: Sensation is intact to light touch throughout Motor: Strength is at least antigravity x4. DTR's 2/4 in UE/LE     Movement examination: Tone: There is normal tone  in the UE/LE Abnormal movements: Chronic very mild left-handed tremor, no cogwheeling.  No myoclonus.  No asterixis.   Coordination:  There is some decremation with RAM's.  Abnormal finger to nose  Gait and Station: The patient has no difficulty arising out of a deep-seated chair without the use of the hands. The patient's stride length is good.  Gait is cautious and narrow.    Thank you for allowing [place] the opportunity to participate in the care of this nice patient. Please do not hesitate to contact us for any questions or concerns.   Total time spent on today's visit was 20 minutes dedicated to this patient today, preparing to see patient, examining the patient, ordering tests and/or medications and counseling the patient, documenting clinical information in the EHR or other health record, independently interpreting results and communicating results to the patient/family, discussing treatment and goals, answering patient's questions and coordinating care.  Cc:  Moshe Cipro, NP  Marlowe Kays 11/11/2022 12:19 PM

## 2022-11-16 DIAGNOSIS — H5213 Myopia, bilateral: Secondary | ICD-10-CM | POA: Diagnosis not present

## 2022-11-30 DIAGNOSIS — N5201 Erectile dysfunction due to arterial insufficiency: Secondary | ICD-10-CM | POA: Diagnosis not present

## 2022-11-30 DIAGNOSIS — R3915 Urgency of urination: Secondary | ICD-10-CM | POA: Diagnosis not present

## 2022-12-01 DIAGNOSIS — Z13220 Encounter for screening for lipoid disorders: Secondary | ICD-10-CM | POA: Diagnosis not present

## 2022-12-01 DIAGNOSIS — N1831 Chronic kidney disease, stage 3a: Secondary | ICD-10-CM | POA: Diagnosis not present

## 2022-12-01 DIAGNOSIS — Z0000 Encounter for general adult medical examination without abnormal findings: Secondary | ICD-10-CM | POA: Diagnosis not present

## 2022-12-01 DIAGNOSIS — Z136 Encounter for screening for cardiovascular disorders: Secondary | ICD-10-CM | POA: Diagnosis not present

## 2022-12-02 ENCOUNTER — Other Ambulatory Visit: Payer: Self-pay | Admitting: Family Medicine

## 2022-12-02 DIAGNOSIS — M8588 Other specified disorders of bone density and structure, other site: Secondary | ICD-10-CM

## 2022-12-04 ENCOUNTER — Other Ambulatory Visit: Payer: Self-pay | Admitting: Physician Assistant

## 2022-12-15 DIAGNOSIS — R350 Frequency of micturition: Secondary | ICD-10-CM | POA: Diagnosis not present

## 2022-12-15 DIAGNOSIS — N3281 Overactive bladder: Secondary | ICD-10-CM | POA: Diagnosis not present

## 2022-12-15 DIAGNOSIS — F03918 Unspecified dementia, unspecified severity, with other behavioral disturbance: Secondary | ICD-10-CM | POA: Diagnosis not present

## 2023-01-02 ENCOUNTER — Other Ambulatory Visit: Payer: Self-pay | Admitting: Physician Assistant

## 2023-01-27 DIAGNOSIS — F03B0 Unspecified dementia, moderate, without behavioral disturbance, psychotic disturbance, mood disturbance, and anxiety: Secondary | ICD-10-CM | POA: Diagnosis not present

## 2023-01-27 DIAGNOSIS — F5104 Psychophysiologic insomnia: Secondary | ICD-10-CM | POA: Diagnosis not present

## 2023-01-28 DIAGNOSIS — N3000 Acute cystitis without hematuria: Secondary | ICD-10-CM | POA: Diagnosis not present

## 2023-02-03 DIAGNOSIS — N3941 Urge incontinence: Secondary | ICD-10-CM | POA: Diagnosis not present

## 2023-02-03 DIAGNOSIS — M6281 Muscle weakness (generalized): Secondary | ICD-10-CM | POA: Diagnosis not present

## 2023-02-08 DIAGNOSIS — Z9049 Acquired absence of other specified parts of digestive tract: Secondary | ICD-10-CM | POA: Diagnosis not present

## 2023-02-08 DIAGNOSIS — J189 Pneumonia, unspecified organism: Secondary | ICD-10-CM | POA: Diagnosis not present

## 2023-02-08 DIAGNOSIS — R5381 Other malaise: Secondary | ICD-10-CM | POA: Diagnosis not present

## 2023-02-08 DIAGNOSIS — R112 Nausea with vomiting, unspecified: Secondary | ICD-10-CM | POA: Diagnosis not present

## 2023-02-08 DIAGNOSIS — R042 Hemoptysis: Secondary | ICD-10-CM | POA: Diagnosis not present

## 2023-02-09 DIAGNOSIS — J189 Pneumonia, unspecified organism: Secondary | ICD-10-CM | POA: Diagnosis not present

## 2023-02-25 DIAGNOSIS — R109 Unspecified abdominal pain: Secondary | ICD-10-CM | POA: Diagnosis not present

## 2023-02-26 DIAGNOSIS — N390 Urinary tract infection, site not specified: Secondary | ICD-10-CM | POA: Diagnosis not present

## 2023-02-26 DIAGNOSIS — R102 Pelvic and perineal pain: Secondary | ICD-10-CM | POA: Diagnosis not present

## 2023-02-26 DIAGNOSIS — N5089 Other specified disorders of the male genital organs: Secondary | ICD-10-CM | POA: Diagnosis not present

## 2023-03-01 DIAGNOSIS — R109 Unspecified abdominal pain: Secondary | ICD-10-CM | POA: Diagnosis not present

## 2023-03-01 DIAGNOSIS — F03B0 Unspecified dementia, moderate, without behavioral disturbance, psychotic disturbance, mood disturbance, and anxiety: Secondary | ICD-10-CM | POA: Diagnosis not present

## 2023-03-01 DIAGNOSIS — Z6822 Body mass index (BMI) 22.0-22.9, adult: Secondary | ICD-10-CM | POA: Diagnosis not present

## 2023-03-02 ENCOUNTER — Other Ambulatory Visit: Payer: Self-pay | Admitting: Family Medicine

## 2023-03-02 DIAGNOSIS — R109 Unspecified abdominal pain: Secondary | ICD-10-CM

## 2023-03-04 DIAGNOSIS — R109 Unspecified abdominal pain: Secondary | ICD-10-CM | POA: Diagnosis not present

## 2023-03-08 ENCOUNTER — Encounter: Payer: Self-pay | Admitting: Family Medicine

## 2023-03-10 ENCOUNTER — Ambulatory Visit
Admission: RE | Admit: 2023-03-10 | Discharge: 2023-03-10 | Disposition: A | Discharge status: Home / Self Care | Payer: Medicare Other | Source: Ambulatory Visit | Attending: Family Medicine | Admitting: Family Medicine

## 2023-03-10 DIAGNOSIS — R109 Unspecified abdominal pain: Secondary | ICD-10-CM

## 2023-03-10 DIAGNOSIS — K639 Disease of intestine, unspecified: Secondary | ICD-10-CM | POA: Diagnosis not present

## 2023-03-10 MED ORDER — IOPAMIDOL (ISOVUE-300) INJECTION 61%
100.0000 mL | Freq: Once | INTRAVENOUS | Status: AC | PRN
  Administered 2023-03-10: 100 mL via INTRAVENOUS

## 2023-03-12 ENCOUNTER — Other Ambulatory Visit: Payer: Self-pay | Admitting: Gastroenterology

## 2023-03-12 DIAGNOSIS — Z8719 Personal history of other diseases of the digestive system: Secondary | ICD-10-CM

## 2023-03-12 DIAGNOSIS — R935 Abnormal findings on diagnostic imaging of other abdominal regions, including retroperitoneum: Secondary | ICD-10-CM

## 2023-03-15 ENCOUNTER — Other Ambulatory Visit: Payer: Medicare Other

## 2023-03-18 DIAGNOSIS — K561 Intussusception: Secondary | ICD-10-CM | POA: Diagnosis not present

## 2023-03-22 DIAGNOSIS — Z9049 Acquired absence of other specified parts of digestive tract: Secondary | ICD-10-CM | POA: Diagnosis not present

## 2023-03-22 DIAGNOSIS — R9389 Abnormal findings on diagnostic imaging of other specified body structures: Secondary | ICD-10-CM | POA: Diagnosis not present

## 2023-03-22 DIAGNOSIS — R152 Fecal urgency: Secondary | ICD-10-CM | POA: Diagnosis not present

## 2023-03-22 DIAGNOSIS — Z8719 Personal history of other diseases of the digestive system: Secondary | ICD-10-CM | POA: Diagnosis not present

## 2023-03-29 DIAGNOSIS — Z111 Encounter for screening for respiratory tuberculosis: Secondary | ICD-10-CM | POA: Diagnosis not present

## 2023-03-30 DIAGNOSIS — R9389 Abnormal findings on diagnostic imaging of other specified body structures: Secondary | ICD-10-CM | POA: Diagnosis not present

## 2023-04-14 DIAGNOSIS — K219 Gastro-esophageal reflux disease without esophagitis: Secondary | ICD-10-CM | POA: Diagnosis not present

## 2023-04-14 DIAGNOSIS — F0390 Unspecified dementia without behavioral disturbance: Secondary | ICD-10-CM | POA: Diagnosis not present

## 2023-04-14 DIAGNOSIS — N183 Chronic kidney disease, stage 3 unspecified: Secondary | ICD-10-CM | POA: Diagnosis not present

## 2023-04-14 DIAGNOSIS — N401 Enlarged prostate with lower urinary tract symptoms: Secondary | ICD-10-CM | POA: Diagnosis not present

## 2023-04-20 ENCOUNTER — Other Ambulatory Visit: Payer: Medicare Other

## 2023-04-27 ENCOUNTER — Ambulatory Visit: Payer: Medicare Other | Admitting: Physician Assistant

## 2023-04-27 ENCOUNTER — Encounter: Payer: Self-pay | Admitting: Physician Assistant

## 2023-04-27 VITALS — BP 166/76 | HR 65 | Resp 96 | Ht 70.0 in | Wt 168.0 lb

## 2023-04-27 DIAGNOSIS — R4701 Aphasia: Secondary | ICD-10-CM

## 2023-04-27 DIAGNOSIS — F0280 Dementia in other diseases classified elsewhere without behavioral disturbance: Secondary | ICD-10-CM

## 2023-04-27 DIAGNOSIS — G309 Alzheimer's disease, unspecified: Secondary | ICD-10-CM

## 2023-04-27 NOTE — Progress Notes (Signed)
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 RH male with a history of hyperparathyroidism, peripheral neuropathy,dementia likely due to Alzheimer's disease, moderate nonfluent aphasia seen today in follow up for memory loss. Patient is currently on memantine 10 mg twice daily and donepezil 10 mg daily.  Patient's memory and aphasia are showing significant decline. He is now on memory care, and needs help with his ADLs  He is on 24/7 for safety.  Discussed discontinuing the memory meds, but wife favors to keep him on those. Suspect caregiver distress and recommend caregiver support group.     Follow up in 6  months. Continue memantine 10 mg twice daily and donepezil 10 mg daily,although may no longer be therapeutic, wife prefers to keep him on these medicines for coverage. Recommend good control of her cardiovascular risk factors Continue to control mood as per PCP     Subjective:    This patient is accompanied in the office by his wife who supplements the history.  Previous records as well as any outside records available were reviewed prior to todays visit. Patient was last seen on 11/11/2022   Any changes in memory since last visit? " It has been a nightmare lately, it was doing well till his son Jason Stephenson) placed him on a memory care"-she says.  He has difficulty with both short-term and long-term memory, some days are better than others. Aphasia is worse, and comprehension very reduced repeats oneself?  Endorsed Disoriented when walking into a room?  Patient denies   Leaving objects?  May misplace things but not in unusual places  Wandering behavior?  denies   Any personality changes since last visit?  denies   Any worsening depression?:  "He used to be happy and now he fights with everything" wife says  Hallucinations or paranoia?  Denies.   Seizures? denies    Any sleep changes?  "At home he was  great but at the memory care he is up all night" she says.   Sleep apnea?   Denies.   Any hygiene concerns? Wife showers him at home frequently  Independent of bathing and dressing? Needs assistance Does the patient needs help with medications?  Wife is in charge   Who is in charge of the finances?   Wife is in charge     Any changes in appetite?  denies     Patient have trouble swallowing? Denies.   Does the patient cook? No Any headaches?   denies   Chronic back pain  denies   Ambulates with difficulty? Denies.   Recent falls or head injuries? denies     Unilateral weakness, numbness or tingling? denies   Any tremors?  Denies   Any anosmia?  Denies   Any incontinence of urine?  Endorsed, wears diapers Any bowel dysfunction?  He has no control of the stools wears diapers      Patient lives in a memory care for the last 3 months  [facility]  Does the patient drive? No longer drives    "INITIAL HISTORY OF PRESENT ILLNESS: This is a 77 year old right-handed man with a history of hyperparathyroidism s/p parathyroidectomy, neuropathy, presenting for evaluation of dementia. He is accompanied by his friend of 60 years, Jason Stephenson, who helps provide additional information. He feels his memory is pretty good. He has been living with his girlfriend for the past 1.5 years. He denies getting  lost driving, missing medications or bill payments. He reports his girlfriend does not need to remind him of medications. She does most of the cooking. Jason Stephenson got back in touch with him when he moved back to [place] 3 years ago and noticed memory changes at that time. Over the past 2 years, the memory lapses have progressed a little more to where he gets a little confused with issues and has trouble getting his sentences out. Jason Stephenson has noticed he has difficulty composing a sentence, needing to stop and think. Jason Stephenson has noticed he accumulates a lot of papers that they have tried to clean up, Jason Stephenson thinks this  is because he would forget and needs to look back on paperwork. Jason Stephenson has helped him get plugged in with a PCP and arrange estate planning and HCPOA. Jason Stephenson expressed concern about his driving with recognizing stoplights and time. He repeatedly says he does well with driving. No hygiene concerns. He has been on Donepezil 10mg  daily for a year, no side effects. Jason Stephenson has not noticed any personality changes, paranoia or hallucinations. He states his mood is okay. He exercises regularly in the [employer]. There is no family history of dementia, no history of significant head injuries. He drinks alcohol on occasion.    He denies any headaches, dizziness, diplopia, dysarthria, dysphagia, neck/back pain, focal numbness/tingling/weakness, bowel/bladder dysfunction. No anosmia, tremors, no falls. Sleep is good.    Personally reviewed MRI brain with and without contrast done January 2020 no acute changes, there is moderate diffuse atrophy, minimal chronic microvascular disease  PREVIOUS MEDICATIONS:   CURRENT MEDICATIONS:  Outpatient Encounter Medications as of 04/27/2023  Medication Sig   Cholecalciferol (VITAMIN D3) 50 MCG (2000 UT) TABS Take 2,000 Units by mouth daily with breakfast.   donepezil (ARICEPT) 10 MG tablet Take 1 tablet (10 mg total) by mouth daily. (Patient taking differently: Take 10 mg by mouth in the morning.)   finasteride (PROSCAR) 5 MG tablet Take 5 mg by mouth daily.   memantine (NAMENDA) 10 MG tablet Take 1 tablet by mouth twice daily   SEROQUEL 25 MG tablet Take 25 mg by mouth at bedtime.   tadalafil (CIALIS) 5 MG tablet Take 5 mg by mouth daily.   No facility-administered encounter medications on file as of 04/27/2023.       03/15/2020    9:00 AM  MMSE - Mini Mental State Exam  Orientation to time 0  Orientation to Place 1  Registration 3  Attention/ Calculation 0  Recall 3  Language- name 2 objects 2  Language- repeat 0  Language- follow 3 step command 0   Language- read & follow direction 0  Write a sentence 0  Copy design 0  Total score 9       No data to display          Objective:     PHYSICAL EXAMINATION:    VITALS:   Vitals:   04/27/23 1451 04/27/23 1527  BP: (!) 163/82 (!) 166/76  Pulse: 65   Resp: (!) 96   Weight: 168 lb (76.2 kg)   Height: 5\' 10"  (1.778 m)     GEN:  The patient appears stated age and is in NAD. HEENT:  Normocephalic, atraumatic.   Neurological examination:  General: NAD, well-groomed, appears stated age. Orientation: The patient is alert. Oriented to person, place and date Cranial nerves: There is good facial symmetry.The speech is not fluent but clear. Noted pronounced aphasia, no dysarthria. Fund of knowledge is  reduced. Recent and remote memory are impaired. Attention and concentration are reduced. Unable to name objects and repeat phrases.  Hearing is intact to conversational tone.   Sensation: Sensation is intact to light touch throughout Motor: Strength is at least antigravity x4. DTR's 2/4 in UE/LE     Movement examination: Tone: There is normal tone in the UE/LE Abnormal movements:  no tremor.  No myoclonus.  No asterixis.   Coordination:  There is B decremation with RAM's. Abnormal finger to nose. Unable to follow instructions   Gait and Station: The patient has no difficulty arising out of a deep-seated chair without the use of the hands. The patient's stride length is shorter than prior. Gait is cautious and narrow.    Thank you for allowing [place] the opportunity to participate in the care of this nice patient. Please do not hesitate to contact us for any questions or concerns.   Total time spent on today's visit was 41 minutes dedicated to this patient today, preparing to see patient, examining the patient, ordering tests and/or medications and counseling the patient, documenting clinical information in the EHR or other health record, independently interpreting results and  communicating results to the patient/family, discussing treatment and goals, answering patient's questions and coordinating care.  Cc:  No primary care provider on file.  Marlowe Kays 04/27/2023 6:04 PM

## 2023-04-27 NOTE — Patient Instructions (Signed)
It was a pleasure to see you today at our office.   Recommendations:  Follow up in 6 months Continue donepezil 10 mg daily. Side effects were discussed  Continue Memantine 10 mg twice daily.Side effects were discussed   Whom to call:  Memory  decline, memory medications: Call our office [phone]   For psychiatric meds, mood meds: Please have your primary care physician manage these medications.      For assessment of decision of mental capacity and competency:  Call Dr. Michelle Haber, geriatric psychiatrist at [phone]  For guidance in geriatric dementia issues please call [facility] [phone]     If you have any severe symptoms of a stroke, or other severe issues such as confusion,severe chills or fever, etc call 911 or go to the ER as you may need to be evaluated further       RECOMMENDATIONS FOR ALL PATIENTS WITH MEMORY PROBLEMS: 1. Continue to exercise (Recommend 30 minutes of walking everyday, or 3 hours every week) 2. Increase social interactions - continue going to Church and enjoy social gatherings with friends and family 3. Eat healthy, avoid fried foods and eat more fruits and vegetables 4. Maintain adequate blood pressure, blood sugar, and blood cholesterol level. Reducing the risk of stroke and cardiovascular disease also helps promoting better memory. 5. Avoid stressful situations. Live a simple life and avoid aggravations. Organize your time and prepare for the next day in anticipation. 6. Sleep well, avoid any interruptions of sleep and avoid any distractions in the bedroom that may interfere with adequate sleep quality 7. Avoid sugar, avoid sweets as there is a strong link between excessive sugar intake, diabetes, and cognitive impairment We discussed the Mediterranean diet, which has been shown to help patients reduce the risk of progressive memory disorders and reduces cardiovascular risk. This includes eating fish, eat fruits and green  leafy vegetables, nuts like almonds and hazelnuts, walnuts, and also use olive oil. Avoid fast foods and fried foods as much as possible. Avoid sweets and sugar as sugar use has been linked to worsening of memory function.  There is always a concern of gradual progression of memory problems. If this is the case, then we may need to adjust level of care according to patient needs. Support, both to the patient and caregiver, should then be put into place.    The [employer] Association is here all day, every day for people facing Alzheimer's disease through our free 24/7 Helpline: [phone]. The Helpline provides reliable information and support to all those who need assistance, such as individuals living with memory loss, Alzheimer's or other dementia, caregivers, health care professionals and the public.  Our highly trained and knowledgeable staff can help you with: Understanding memory loss, dementia and Alzheimer's  Medications and other treatment options  General information about aging and brain health  Skills to provide quality care and to find the best care from professionals  Legal, financial and living-arrangement decisions Our Helpline also features: Confidential care consultation provided by master's level clinicians who can help with decision-making support, crisis assistance and education on issues families face every day  Help in a caller's preferred language using our translation service that features more than 200 languages and dialects  Referrals to local community programs, services and ongoing support     FALL PRECAUTIONS: Be cautious when walking. Scan the area for obstacles that may increase the risk of trips and falls. When getting up in the mornings, sit up at the edge   of the bed for a few minutes before getting out of bed. Consider elevating the bed at the head end to avoid drop of blood pressure when getting up. Walk always in a well-lit room (use night lights in the  walls). Avoid area rugs or power cords from appliances in the middle of the walkways. Use a walker or a cane if necessary and consider physical therapy for balance exercise. Get your eyesight checked regularly.  FINANCIAL OVERSIGHT: Supervision, especially oversight when making financial decisions or transactions is also recommended.  HOME SAFETY: Consider the safety of the kitchen when operating appliances like stoves, microwave oven, and blender. Consider having supervision and share cooking responsibilities until no longer able to participate in those. Accidents with firearms and other hazards in the house should be identified and addressed as well.   ABILITY TO BE LEFT ALONE: If patient is unable to contact 911 operator, consider using LifeLine, or when the need is there, arrange for someone to stay with patients. Smoking is a fire hazard, consider supervision or cessation. Risk of wandering should be assessed by caregiver and if detected at any point, supervision and safe proof recommendations should be instituted.  MEDICATION SUPERVISION: Inability to self-administer medication needs to be constantly addressed. Implement a mechanism to ensure safe administration of the medications.   DRIVING: Regarding driving, in patients with progressive memory problems, driving will be impaired. We advise to have someone else do the driving if trouble finding directions or if minor accidents are reported. Independent driving assessment is available to determine safety of driving.   If you are interested in the driving assessment, you can contact the following:  [employer] in [place] [phone]  [facility] [phone]  [facility] [phone]  [facility] [phone] or [phone]      Mediterranean Diet A Mediterranean diet refers to food and lifestyle choices that are based on the traditions of countries located on the [place]  Sea. This way of eating has been shown to help prevent certain conditions and improve outcomes for people who have chronic diseases, like kidney disease and heart disease. What are tips for following this plan? Lifestyle  Cook and eat meals together with your family, when possible. Drink enough fluid to keep your urine clear or pale yellow. Be physically active every day. This includes: Aerobic exercise like running or swimming. Leisure activities like gardening, walking, or housework. Get 7-8 hours of sleep each night. If recommended by your health care provider, drink red wine in moderation. This means 1 glass a day for nonpregnant women and 2 glasses a day for men. A glass of wine equals 5 oz (150 mL). Reading food labels  Check the serving size of packaged foods. For foods such as rice and pasta, the serving size refers to the amount of cooked product, not dry. Check the total fat in packaged foods. Avoid foods that have saturated fat or trans fats. Check the ingredients list for added sugars, such as corn syrup. Shopping  At the grocery store, buy most of your food from the areas near the walls of the store. This includes: Fresh fruits and vegetables (produce). Grains, beans, nuts, and seeds. Some of these may be available in unpackaged forms or large amounts (in bulk). Fresh seafood. Poultry and eggs. Low-fat dairy products. Buy whole ingredients instead of prepackaged foods. Buy fresh fruits and vegetables in-season from local farmers markets. Buy frozen fruits and vegetables in resealable bags. If you do not have access   to quality fresh seafood, buy precooked frozen shrimp or canned fish, such as tuna, salmon, or sardines. Buy small amounts of raw or cooked vegetables, salads, or olives from the deli or salad bar at your store. Stock your pantry so you always have certain foods on hand, such as olive oil, canned tuna, canned tomatoes, rice, pasta, and beans. Cooking  Cook foods  with extra-virgin olive oil instead of using butter or other vegetable oils. Have meat as a side dish, and have vegetables or grains as your main dish. This means having meat in small portions or adding small amounts of meat to foods like pasta or stew. Use beans or vegetables instead of meat in common dishes like chili or lasagna. Experiment with different cooking methods. Try roasting or broiling vegetables instead of steaming or sauteing them. Add frozen vegetables to soups, stews, pasta, or rice. Add nuts or seeds for added healthy fat at each meal. You can add these to yogurt, salads, or vegetable dishes. Marinate fish or vegetables using olive oil, lemon juice, garlic, and fresh herbs. Meal planning  Plan to eat 1 vegetarian meal one day each week. Try to work up to 2 vegetarian meals, if possible. Eat seafood 2 or more times a week. Have healthy snacks readily available, such as: Vegetable sticks with hummus. Greek yogurt. Fruit and nut trail mix. Eat balanced meals throughout the week. This includes: Fruit: 2-3 servings a day Vegetables: 4-5 servings a day Low-fat dairy: 2 servings a day Fish, poultry, or lean meat: 1 serving a day Beans and legumes: 2 or more servings a week Nuts and seeds: 1-2 servings a day Whole grains: 6-8 servings a day Extra-virgin olive oil: 3-4 servings a day Limit red meat and sweets to only a few servings a month What are my food choices? Mediterranean diet Recommended Grains: Whole-grain pasta. Brown rice. Bulgar wheat. Polenta. Couscous. Whole-wheat bread. Oatmeal. Quinoa. Vegetables: Artichokes. Beets. Broccoli. Cabbage. Carrots. Eggplant. Green beans. Chard. Kale. Spinach. Onions. Leeks. Peas. Squash. Tomatoes. Peppers. Radishes. Fruits: Apples. Apricots. Avocado. Berries. Bananas. Cherries. Dates. Figs. Grapes. Lemons. Melon. Oranges. Peaches. Plums. Pomegranate. Meats and other protein foods: Beans. Almonds. Sunflower seeds. Pine nuts. Peanuts.  Cod. Salmon. Scallops. Shrimp. Tuna. Tilapia. Clams. Oysters. Eggs. Dairy: Low-fat milk. Cheese. Greek yogurt. Beverages: Water. Red wine. Herbal tea. Fats and oils: Extra virgin olive oil. Avocado oil. Grape seed oil. Sweets and desserts: Greek yogurt with honey. Baked apples. Poached pears. Trail mix. Seasoning and other foods: Basil. Cilantro. Coriander. Cumin. Mint. Parsley. Sage. Rosemary. Tarragon. Garlic. Oregano. Thyme. Pepper. Balsalmic vinegar. Tahini. Hummus. Tomato sauce. Olives. Mushrooms. Limit these Grains: Prepackaged pasta or rice dishes. Prepackaged cereal with added sugar. Vegetables: Deep fried potatoes (french fries). Fruits: Fruit canned in syrup. Meats and other protein foods: Beef. Pork. Lamb. Poultry with skin. Hot dogs. Bacon. Dairy: Ice cream. Sour cream. Whole milk. Beverages: Juice. Sugar-sweetened soft drinks. Beer. Liquor and spirits. Fats and oils: Butter. Canola oil. Vegetable oil. Beef fat (tallow). Lard. Sweets and desserts: Cookies. Cakes. Pies. Candy. Seasoning and other foods: Mayonnaise. Premade sauces and marinades. The items listed may not be a complete list. Talk with your dietitian about what dietary choices are right for you. Summary The Mediterranean diet includes both food and lifestyle choices. Eat a variety of fresh fruits and vegetables, beans, nuts, seeds, and whole grains. Limit the amount of red meat and sweets that you eat. Talk with your health care provider about whether it is safe for you to drink   red wine in moderation. This means 1 glass a day for nonpregnant women and 2 glasses a day for men. A glass of wine equals 5 oz (150 mL). This information is not intended to replace advice given to you by your health care provider. Make sure you discuss any questions you have with your health care provider. Document Released: 12/26/2015 Document Revised: 01/28/2016 Document Reviewed: 12/26/2015 Elsevier Interactive Patient Education  2017  Elsevier Inc.     

## 2023-04-28 DIAGNOSIS — N401 Enlarged prostate with lower urinary tract symptoms: Secondary | ICD-10-CM | POA: Diagnosis not present

## 2023-04-28 DIAGNOSIS — F0390 Unspecified dementia without behavioral disturbance: Secondary | ICD-10-CM | POA: Diagnosis not present

## 2023-04-28 DIAGNOSIS — N183 Chronic kidney disease, stage 3 unspecified: Secondary | ICD-10-CM | POA: Diagnosis not present

## 2023-04-28 DIAGNOSIS — K219 Gastro-esophageal reflux disease without esophagitis: Secondary | ICD-10-CM | POA: Diagnosis not present

## 2023-05-03 DIAGNOSIS — R488 Other symbolic dysfunctions: Secondary | ICD-10-CM | POA: Diagnosis not present

## 2023-05-04 DIAGNOSIS — K219 Gastro-esophageal reflux disease without esophagitis: Secondary | ICD-10-CM | POA: Diagnosis not present

## 2023-05-04 DIAGNOSIS — F0394 Unspecified dementia, unspecified severity, with anxiety: Secondary | ICD-10-CM | POA: Diagnosis not present

## 2023-05-04 DIAGNOSIS — N183 Chronic kidney disease, stage 3 unspecified: Secondary | ICD-10-CM | POA: Diagnosis not present

## 2023-05-04 DIAGNOSIS — N401 Enlarged prostate with lower urinary tract symptoms: Secondary | ICD-10-CM | POA: Diagnosis not present

## 2023-05-04 DIAGNOSIS — I10 Essential (primary) hypertension: Secondary | ICD-10-CM | POA: Diagnosis not present

## 2023-05-05 DIAGNOSIS — R488 Other symbolic dysfunctions: Secondary | ICD-10-CM | POA: Diagnosis not present

## 2023-05-06 DIAGNOSIS — R488 Other symbolic dysfunctions: Secondary | ICD-10-CM | POA: Diagnosis not present

## 2023-05-09 DIAGNOSIS — R488 Other symbolic dysfunctions: Secondary | ICD-10-CM | POA: Diagnosis not present

## 2023-05-10 DIAGNOSIS — R488 Other symbolic dysfunctions: Secondary | ICD-10-CM | POA: Diagnosis not present

## 2023-05-11 DIAGNOSIS — R488 Other symbolic dysfunctions: Secondary | ICD-10-CM | POA: Diagnosis not present

## 2023-05-14 DIAGNOSIS — R488 Other symbolic dysfunctions: Secondary | ICD-10-CM | POA: Diagnosis not present

## 2023-05-14 DIAGNOSIS — R4185 Anosognosia: Secondary | ICD-10-CM | POA: Diagnosis not present

## 2023-05-14 DIAGNOSIS — R4789 Other speech disturbances: Secondary | ICD-10-CM | POA: Diagnosis not present

## 2023-05-18 DIAGNOSIS — R488 Other symbolic dysfunctions: Secondary | ICD-10-CM | POA: Diagnosis not present

## 2023-05-20 DIAGNOSIS — R488 Other symbolic dysfunctions: Secondary | ICD-10-CM | POA: Diagnosis not present

## 2023-05-21 DIAGNOSIS — R488 Other symbolic dysfunctions: Secondary | ICD-10-CM | POA: Diagnosis not present

## 2023-05-21 DIAGNOSIS — R4789 Other speech disturbances: Secondary | ICD-10-CM | POA: Diagnosis not present

## 2023-05-21 DIAGNOSIS — R4185 Anosognosia: Secondary | ICD-10-CM | POA: Diagnosis not present

## 2023-05-23 DIAGNOSIS — R488 Other symbolic dysfunctions: Secondary | ICD-10-CM | POA: Diagnosis not present

## 2023-05-24 DIAGNOSIS — N401 Enlarged prostate with lower urinary tract symptoms: Secondary | ICD-10-CM | POA: Diagnosis not present

## 2023-05-24 DIAGNOSIS — N183 Chronic kidney disease, stage 3 unspecified: Secondary | ICD-10-CM | POA: Diagnosis not present

## 2023-05-24 DIAGNOSIS — F411 Generalized anxiety disorder: Secondary | ICD-10-CM | POA: Diagnosis not present

## 2023-05-24 DIAGNOSIS — E559 Vitamin D deficiency, unspecified: Secondary | ICD-10-CM | POA: Diagnosis not present

## 2023-05-24 DIAGNOSIS — F5105 Insomnia due to other mental disorder: Secondary | ICD-10-CM | POA: Diagnosis not present

## 2023-05-24 DIAGNOSIS — F331 Major depressive disorder, recurrent, moderate: Secondary | ICD-10-CM | POA: Diagnosis not present

## 2023-05-24 DIAGNOSIS — F03918 Unspecified dementia, unspecified severity, with other behavioral disturbance: Secondary | ICD-10-CM | POA: Diagnosis not present

## 2023-05-24 DIAGNOSIS — E785 Hyperlipidemia, unspecified: Secondary | ICD-10-CM | POA: Diagnosis not present

## 2023-05-25 DIAGNOSIS — R488 Other symbolic dysfunctions: Secondary | ICD-10-CM | POA: Diagnosis not present

## 2023-05-25 DIAGNOSIS — R4185 Anosognosia: Secondary | ICD-10-CM | POA: Diagnosis not present

## 2023-05-25 DIAGNOSIS — R4789 Other speech disturbances: Secondary | ICD-10-CM | POA: Diagnosis not present

## 2023-05-27 DIAGNOSIS — R488 Other symbolic dysfunctions: Secondary | ICD-10-CM | POA: Diagnosis not present

## 2023-05-27 DIAGNOSIS — M62552 Muscle wasting and atrophy, not elsewhere classified, left thigh: Secondary | ICD-10-CM | POA: Diagnosis not present

## 2023-05-27 DIAGNOSIS — M62562 Muscle wasting and atrophy, not elsewhere classified, left lower leg: Secondary | ICD-10-CM | POA: Diagnosis not present

## 2023-05-27 DIAGNOSIS — R4185 Anosognosia: Secondary | ICD-10-CM | POA: Diagnosis not present

## 2023-05-27 DIAGNOSIS — R4789 Other speech disturbances: Secondary | ICD-10-CM | POA: Diagnosis not present

## 2023-05-27 DIAGNOSIS — M62561 Muscle wasting and atrophy, not elsewhere classified, right lower leg: Secondary | ICD-10-CM | POA: Diagnosis not present

## 2023-05-27 DIAGNOSIS — M62551 Muscle wasting and atrophy, not elsewhere classified, right thigh: Secondary | ICD-10-CM | POA: Diagnosis not present

## 2023-05-31 DIAGNOSIS — R4185 Anosognosia: Secondary | ICD-10-CM | POA: Diagnosis not present

## 2023-05-31 DIAGNOSIS — R488 Other symbolic dysfunctions: Secondary | ICD-10-CM | POA: Diagnosis not present

## 2023-05-31 DIAGNOSIS — R4789 Other speech disturbances: Secondary | ICD-10-CM | POA: Diagnosis not present

## 2023-06-04 DIAGNOSIS — R4789 Other speech disturbances: Secondary | ICD-10-CM | POA: Diagnosis not present

## 2023-06-04 DIAGNOSIS — R4185 Anosognosia: Secondary | ICD-10-CM | POA: Diagnosis not present

## 2023-06-04 DIAGNOSIS — R488 Other symbolic dysfunctions: Secondary | ICD-10-CM | POA: Diagnosis not present

## 2023-06-09 DIAGNOSIS — R4789 Other speech disturbances: Secondary | ICD-10-CM | POA: Diagnosis not present

## 2023-06-09 DIAGNOSIS — R488 Other symbolic dysfunctions: Secondary | ICD-10-CM | POA: Diagnosis not present

## 2023-06-09 DIAGNOSIS — R4185 Anosognosia: Secondary | ICD-10-CM | POA: Diagnosis not present

## 2023-06-11 DIAGNOSIS — R4185 Anosognosia: Secondary | ICD-10-CM | POA: Diagnosis not present

## 2023-06-11 DIAGNOSIS — R488 Other symbolic dysfunctions: Secondary | ICD-10-CM | POA: Diagnosis not present

## 2023-06-11 DIAGNOSIS — R4789 Other speech disturbances: Secondary | ICD-10-CM | POA: Diagnosis not present

## 2023-06-14 DIAGNOSIS — F01518 Vascular dementia, unspecified severity, with other behavioral disturbance: Secondary | ICD-10-CM | POA: Diagnosis not present

## 2023-06-14 DIAGNOSIS — F331 Major depressive disorder, recurrent, moderate: Secondary | ICD-10-CM | POA: Diagnosis not present

## 2023-06-14 DIAGNOSIS — F411 Generalized anxiety disorder: Secondary | ICD-10-CM | POA: Diagnosis not present

## 2023-06-14 DIAGNOSIS — F5105 Insomnia due to other mental disorder: Secondary | ICD-10-CM | POA: Diagnosis not present

## 2023-06-16 DIAGNOSIS — R488 Other symbolic dysfunctions: Secondary | ICD-10-CM | POA: Diagnosis not present

## 2023-06-16 DIAGNOSIS — N1831 Chronic kidney disease, stage 3a: Secondary | ICD-10-CM | POA: Diagnosis not present

## 2023-06-16 DIAGNOSIS — Z5181 Encounter for therapeutic drug level monitoring: Secondary | ICD-10-CM | POA: Diagnosis not present

## 2023-06-16 DIAGNOSIS — G309 Alzheimer's disease, unspecified: Secondary | ICD-10-CM | POA: Diagnosis not present

## 2023-06-16 DIAGNOSIS — R451 Restlessness and agitation: Secondary | ICD-10-CM | POA: Diagnosis not present

## 2023-06-16 DIAGNOSIS — E782 Mixed hyperlipidemia: Secondary | ICD-10-CM | POA: Diagnosis not present

## 2023-06-16 DIAGNOSIS — R4789 Other speech disturbances: Secondary | ICD-10-CM | POA: Diagnosis not present

## 2023-06-16 DIAGNOSIS — R4185 Anosognosia: Secondary | ICD-10-CM | POA: Diagnosis not present

## 2023-06-18 DIAGNOSIS — R488 Other symbolic dysfunctions: Secondary | ICD-10-CM | POA: Diagnosis not present

## 2023-06-18 DIAGNOSIS — R4789 Other speech disturbances: Secondary | ICD-10-CM | POA: Diagnosis not present

## 2023-06-18 DIAGNOSIS — R4185 Anosognosia: Secondary | ICD-10-CM | POA: Diagnosis not present

## 2023-06-21 DIAGNOSIS — F5105 Insomnia due to other mental disorder: Secondary | ICD-10-CM | POA: Diagnosis not present

## 2023-06-21 DIAGNOSIS — F331 Major depressive disorder, recurrent, moderate: Secondary | ICD-10-CM | POA: Diagnosis not present

## 2023-06-21 DIAGNOSIS — F411 Generalized anxiety disorder: Secondary | ICD-10-CM | POA: Diagnosis not present

## 2023-06-21 DIAGNOSIS — F01518 Vascular dementia, unspecified severity, with other behavioral disturbance: Secondary | ICD-10-CM | POA: Diagnosis not present

## 2023-06-22 DIAGNOSIS — R488 Other symbolic dysfunctions: Secondary | ICD-10-CM | POA: Diagnosis not present

## 2023-06-22 DIAGNOSIS — R4789 Other speech disturbances: Secondary | ICD-10-CM | POA: Diagnosis not present

## 2023-06-22 DIAGNOSIS — R4185 Anosognosia: Secondary | ICD-10-CM | POA: Diagnosis not present

## 2023-06-28 DIAGNOSIS — N183 Chronic kidney disease, stage 3 unspecified: Secondary | ICD-10-CM | POA: Diagnosis not present

## 2023-06-28 DIAGNOSIS — R488 Other symbolic dysfunctions: Secondary | ICD-10-CM | POA: Diagnosis not present

## 2023-06-28 DIAGNOSIS — R4185 Anosognosia: Secondary | ICD-10-CM | POA: Diagnosis not present

## 2023-06-28 DIAGNOSIS — N401 Enlarged prostate with lower urinary tract symptoms: Secondary | ICD-10-CM | POA: Diagnosis not present

## 2023-06-28 DIAGNOSIS — E559 Vitamin D deficiency, unspecified: Secondary | ICD-10-CM | POA: Diagnosis not present

## 2023-06-28 DIAGNOSIS — E785 Hyperlipidemia, unspecified: Secondary | ICD-10-CM | POA: Diagnosis not present

## 2023-06-28 DIAGNOSIS — R4789 Other speech disturbances: Secondary | ICD-10-CM | POA: Diagnosis not present

## 2023-07-02 DIAGNOSIS — R4789 Other speech disturbances: Secondary | ICD-10-CM | POA: Diagnosis not present

## 2023-07-02 DIAGNOSIS — R4185 Anosognosia: Secondary | ICD-10-CM | POA: Diagnosis not present

## 2023-07-02 DIAGNOSIS — R488 Other symbolic dysfunctions: Secondary | ICD-10-CM | POA: Diagnosis not present

## 2023-07-05 DIAGNOSIS — F5105 Insomnia due to other mental disorder: Secondary | ICD-10-CM | POA: Diagnosis not present

## 2023-07-05 DIAGNOSIS — F331 Major depressive disorder, recurrent, moderate: Secondary | ICD-10-CM | POA: Diagnosis not present

## 2023-07-05 DIAGNOSIS — F411 Generalized anxiety disorder: Secondary | ICD-10-CM | POA: Diagnosis not present

## 2023-07-05 DIAGNOSIS — F0150 Vascular dementia without behavioral disturbance: Secondary | ICD-10-CM | POA: Diagnosis not present

## 2023-07-07 DIAGNOSIS — R4185 Anosognosia: Secondary | ICD-10-CM | POA: Diagnosis not present

## 2023-07-07 DIAGNOSIS — R4789 Other speech disturbances: Secondary | ICD-10-CM | POA: Diagnosis not present

## 2023-07-07 DIAGNOSIS — R488 Other symbolic dysfunctions: Secondary | ICD-10-CM | POA: Diagnosis not present

## 2023-07-09 DIAGNOSIS — R4185 Anosognosia: Secondary | ICD-10-CM | POA: Diagnosis not present

## 2023-07-09 DIAGNOSIS — R488 Other symbolic dysfunctions: Secondary | ICD-10-CM | POA: Diagnosis not present

## 2023-07-09 DIAGNOSIS — R4789 Other speech disturbances: Secondary | ICD-10-CM | POA: Diagnosis not present

## 2023-07-14 DIAGNOSIS — R4185 Anosognosia: Secondary | ICD-10-CM | POA: Diagnosis not present

## 2023-07-14 DIAGNOSIS — R4789 Other speech disturbances: Secondary | ICD-10-CM | POA: Diagnosis not present

## 2023-07-14 DIAGNOSIS — R488 Other symbolic dysfunctions: Secondary | ICD-10-CM | POA: Diagnosis not present

## 2023-07-19 DIAGNOSIS — E785 Hyperlipidemia, unspecified: Secondary | ICD-10-CM | POA: Diagnosis not present

## 2023-07-19 DIAGNOSIS — E559 Vitamin D deficiency, unspecified: Secondary | ICD-10-CM | POA: Diagnosis not present

## 2023-07-19 DIAGNOSIS — N183 Chronic kidney disease, stage 3 unspecified: Secondary | ICD-10-CM | POA: Diagnosis not present

## 2023-07-19 DIAGNOSIS — N401 Enlarged prostate with lower urinary tract symptoms: Secondary | ICD-10-CM | POA: Diagnosis not present

## 2023-07-21 DIAGNOSIS — R488 Other symbolic dysfunctions: Secondary | ICD-10-CM | POA: Diagnosis not present

## 2023-07-21 DIAGNOSIS — R4789 Other speech disturbances: Secondary | ICD-10-CM | POA: Diagnosis not present

## 2023-07-21 DIAGNOSIS — R4185 Anosognosia: Secondary | ICD-10-CM | POA: Diagnosis not present

## 2023-07-26 DIAGNOSIS — F5105 Insomnia due to other mental disorder: Secondary | ICD-10-CM | POA: Diagnosis not present

## 2023-07-26 DIAGNOSIS — F411 Generalized anxiety disorder: Secondary | ICD-10-CM | POA: Diagnosis not present

## 2023-07-26 DIAGNOSIS — F331 Major depressive disorder, recurrent, moderate: Secondary | ICD-10-CM | POA: Diagnosis not present

## 2023-07-26 DIAGNOSIS — F01518 Vascular dementia, unspecified severity, with other behavioral disturbance: Secondary | ICD-10-CM | POA: Diagnosis not present

## 2023-07-28 DIAGNOSIS — R488 Other symbolic dysfunctions: Secondary | ICD-10-CM | POA: Diagnosis not present

## 2023-07-28 DIAGNOSIS — R4185 Anosognosia: Secondary | ICD-10-CM | POA: Diagnosis not present

## 2023-07-28 DIAGNOSIS — R4789 Other speech disturbances: Secondary | ICD-10-CM | POA: Diagnosis not present

## 2023-08-03 DIAGNOSIS — R4185 Anosognosia: Secondary | ICD-10-CM | POA: Diagnosis not present

## 2023-08-03 DIAGNOSIS — R4789 Other speech disturbances: Secondary | ICD-10-CM | POA: Diagnosis not present

## 2023-08-03 DIAGNOSIS — R488 Other symbolic dysfunctions: Secondary | ICD-10-CM | POA: Diagnosis not present

## 2023-08-05 DIAGNOSIS — N1831 Chronic kidney disease, stage 3a: Secondary | ICD-10-CM | POA: Diagnosis not present

## 2023-08-05 DIAGNOSIS — K5904 Chronic idiopathic constipation: Secondary | ICD-10-CM | POA: Diagnosis not present

## 2023-08-05 DIAGNOSIS — K219 Gastro-esophageal reflux disease without esophagitis: Secondary | ICD-10-CM | POA: Diagnosis not present

## 2023-08-05 DIAGNOSIS — N400 Enlarged prostate without lower urinary tract symptoms: Secondary | ICD-10-CM | POA: Diagnosis not present

## 2023-08-09 DIAGNOSIS — E785 Hyperlipidemia, unspecified: Secondary | ICD-10-CM | POA: Diagnosis not present

## 2023-08-09 DIAGNOSIS — F5105 Insomnia due to other mental disorder: Secondary | ICD-10-CM | POA: Diagnosis not present

## 2023-08-09 DIAGNOSIS — F01518 Vascular dementia, unspecified severity, with other behavioral disturbance: Secondary | ICD-10-CM | POA: Diagnosis not present

## 2023-08-09 DIAGNOSIS — F331 Major depressive disorder, recurrent, moderate: Secondary | ICD-10-CM | POA: Diagnosis not present

## 2023-08-09 DIAGNOSIS — F411 Generalized anxiety disorder: Secondary | ICD-10-CM | POA: Diagnosis not present

## 2023-08-11 DIAGNOSIS — R488 Other symbolic dysfunctions: Secondary | ICD-10-CM | POA: Diagnosis not present

## 2023-08-11 DIAGNOSIS — R4789 Other speech disturbances: Secondary | ICD-10-CM | POA: Diagnosis not present

## 2023-08-11 DIAGNOSIS — R4185 Anosognosia: Secondary | ICD-10-CM | POA: Diagnosis not present

## 2023-08-20 DIAGNOSIS — R488 Other symbolic dysfunctions: Secondary | ICD-10-CM | POA: Diagnosis not present

## 2023-08-20 DIAGNOSIS — R4185 Anosognosia: Secondary | ICD-10-CM | POA: Diagnosis not present

## 2023-08-20 DIAGNOSIS — R4789 Other speech disturbances: Secondary | ICD-10-CM | POA: Diagnosis not present

## 2023-08-22 DIAGNOSIS — R4789 Other speech disturbances: Secondary | ICD-10-CM | POA: Diagnosis not present

## 2023-08-22 DIAGNOSIS — R488 Other symbolic dysfunctions: Secondary | ICD-10-CM | POA: Diagnosis not present

## 2023-08-22 DIAGNOSIS — R4185 Anosognosia: Secondary | ICD-10-CM | POA: Diagnosis not present

## 2023-08-23 DIAGNOSIS — F5105 Insomnia due to other mental disorder: Secondary | ICD-10-CM | POA: Diagnosis not present

## 2023-08-23 DIAGNOSIS — F0150 Vascular dementia without behavioral disturbance: Secondary | ICD-10-CM | POA: Diagnosis not present

## 2023-08-23 DIAGNOSIS — F331 Major depressive disorder, recurrent, moderate: Secondary | ICD-10-CM | POA: Diagnosis not present

## 2023-08-23 DIAGNOSIS — F411 Generalized anxiety disorder: Secondary | ICD-10-CM | POA: Diagnosis not present

## 2023-09-10 DIAGNOSIS — M25551 Pain in right hip: Secondary | ICD-10-CM | POA: Diagnosis not present

## 2023-09-14 DIAGNOSIS — F411 Generalized anxiety disorder: Secondary | ICD-10-CM | POA: Diagnosis not present

## 2023-09-14 DIAGNOSIS — E785 Hyperlipidemia, unspecified: Secondary | ICD-10-CM | POA: Diagnosis not present

## 2023-09-17 ENCOUNTER — Emergency Department (HOSPITAL_COMMUNITY)

## 2023-09-17 ENCOUNTER — Inpatient Hospital Stay (HOSPITAL_COMMUNITY)
Admission: EM | Admit: 2023-09-17 | Discharge: 2023-09-21 | DRG: 056 | Disposition: A | Discharge status: Home Health | Source: Skilled Nursing Facility | Attending: Infectious Diseases | Admitting: Infectious Diseases

## 2023-09-17 ENCOUNTER — Inpatient Hospital Stay (HOSPITAL_COMMUNITY)

## 2023-09-17 DIAGNOSIS — Z820 Family history of epilepsy and other diseases of the nervous system: Secondary | ICD-10-CM | POA: Diagnosis not present

## 2023-09-17 DIAGNOSIS — R55 Syncope and collapse: Secondary | ICD-10-CM | POA: Diagnosis present

## 2023-09-17 DIAGNOSIS — Z87891 Personal history of nicotine dependence: Secondary | ICD-10-CM

## 2023-09-17 DIAGNOSIS — F02C11 Dementia in other diseases classified elsewhere, severe, with agitation: Secondary | ICD-10-CM

## 2023-09-17 DIAGNOSIS — R4182 Altered mental status, unspecified: Principal | ICD-10-CM | POA: Diagnosis present

## 2023-09-17 DIAGNOSIS — G9341 Metabolic encephalopathy: Secondary | ICD-10-CM | POA: Diagnosis present

## 2023-09-17 DIAGNOSIS — N3946 Mixed incontinence: Secondary | ICD-10-CM | POA: Diagnosis not present

## 2023-09-17 DIAGNOSIS — Z7401 Bed confinement status: Secondary | ICD-10-CM | POA: Diagnosis not present

## 2023-09-17 DIAGNOSIS — N401 Enlarged prostate with lower urinary tract symptoms: Secondary | ICD-10-CM | POA: Diagnosis not present

## 2023-09-17 DIAGNOSIS — F02C18 Dementia in other diseases classified elsewhere, severe, with other behavioral disturbance: Secondary | ICD-10-CM | POA: Diagnosis not present

## 2023-09-17 DIAGNOSIS — Z79899 Other long term (current) drug therapy: Secondary | ICD-10-CM

## 2023-09-17 DIAGNOSIS — G309 Alzheimer's disease, unspecified: Principal | ICD-10-CM | POA: Diagnosis present

## 2023-09-17 DIAGNOSIS — R4701 Aphasia: Secondary | ICD-10-CM | POA: Diagnosis not present

## 2023-09-17 DIAGNOSIS — R001 Bradycardia, unspecified: Secondary | ICD-10-CM | POA: Diagnosis not present

## 2023-09-17 DIAGNOSIS — G9340 Encephalopathy, unspecified: Secondary | ICD-10-CM | POA: Diagnosis not present

## 2023-09-17 DIAGNOSIS — N400 Enlarged prostate without lower urinary tract symptoms: Secondary | ICD-10-CM | POA: Diagnosis not present

## 2023-09-17 DIAGNOSIS — Z781 Physical restraint status: Secondary | ICD-10-CM

## 2023-09-17 DIAGNOSIS — N1831 Chronic kidney disease, stage 3a: Secondary | ICD-10-CM | POA: Diagnosis present

## 2023-09-17 DIAGNOSIS — Z66 Do not resuscitate: Secondary | ICD-10-CM | POA: Diagnosis not present

## 2023-09-17 DIAGNOSIS — R69 Illness, unspecified: Secondary | ICD-10-CM | POA: Diagnosis not present

## 2023-09-17 DIAGNOSIS — R569 Unspecified convulsions: Secondary | ICD-10-CM | POA: Diagnosis not present

## 2023-09-17 DIAGNOSIS — R410 Disorientation, unspecified: Secondary | ICD-10-CM | POA: Diagnosis not present

## 2023-09-17 LAB — I-STAT VENOUS BLOOD GAS, ED
Acid-Base Excess: 0 mmol/L (ref 0.0–2.0)
Bicarbonate: 25.3 mmol/L (ref 20.0–28.0)
Calcium, Ion: 1.06 mmol/L — ABNORMAL LOW (ref 1.15–1.40)
HCT: 45 % (ref 39.0–52.0)
Hemoglobin: 15.3 g/dL (ref 13.0–17.0)
O2 Saturation: 61 %
Potassium: 3.9 mmol/L (ref 3.5–5.1)
Sodium: 141 mmol/L (ref 135–145)
TCO2: 27 mmol/L (ref 22–32)
pCO2, Ven: 44.1 mmHg (ref 44–60)
pH, Ven: 7.367 (ref 7.25–7.43)
pO2, Ven: 33 mmHg (ref 32–45)

## 2023-09-17 LAB — COMPREHENSIVE METABOLIC PANEL WITH GFR
ALT: 14 U/L (ref 0–44)
AST: 22 U/L (ref 15–41)
Albumin: 4 g/dL (ref 3.5–5.0)
Alkaline Phosphatase: 83 U/L (ref 38–126)
Anion gap: 10 (ref 5–15)
BUN: 17 mg/dL (ref 8–23)
CO2: 26 mmol/L (ref 22–32)
Calcium: 9.2 mg/dL (ref 8.9–10.3)
Chloride: 105 mmol/L (ref 98–111)
Creatinine, Ser: 1.4 mg/dL — ABNORMAL HIGH (ref 0.61–1.24)
GFR, Estimated: 52 mL/min — ABNORMAL LOW (ref >60)
Glucose, Bld: 88 mg/dL (ref 70–99)
Potassium: 4.2 mmol/L (ref 3.5–5.1)
Sodium: 141 mmol/L (ref 135–145)
Total Bilirubin: 1.3 mg/dL — ABNORMAL HIGH (ref 0.0–1.2)
Total Protein: 7 g/dL (ref 6.5–8.1)

## 2023-09-17 LAB — RAPID URINE DRUG SCREEN, HOSP PERFORMED
Amphetamines: NOT DETECTED
Barbiturates: POSITIVE — AB
Benzodiazepines: NOT DETECTED
Cocaine: NOT DETECTED
Opiates: NOT DETECTED
Tetrahydrocannabinol: NOT DETECTED

## 2023-09-17 LAB — URINALYSIS, ROUTINE W REFLEX MICROSCOPIC
Bacteria, UA: NONE SEEN
Bilirubin Urine: NEGATIVE
Glucose, UA: NEGATIVE mg/dL
Hgb urine dipstick: NEGATIVE
Ketones, ur: NEGATIVE mg/dL
Leukocytes,Ua: NEGATIVE
Nitrite: NEGATIVE
Protein, ur: NEGATIVE mg/dL
Specific Gravity, Urine: 1.01 (ref 1.005–1.030)
pH: 8 (ref 5.0–8.0)

## 2023-09-17 LAB — I-STAT CG4 LACTIC ACID, ED
Lactic Acid, Venous: 2.2 mmol/L (ref 0.5–1.9)
Lactic Acid, Venous: 1.7 mmol/L (ref 0.5–1.9)

## 2023-09-17 LAB — CBC
HCT: 46.9 % (ref 39.0–52.0)
Hemoglobin: 15.8 g/dL (ref 13.0–17.0)
MCH: 31.1 pg (ref 26.0–34.0)
MCHC: 33.7 g/dL (ref 30.0–36.0)
MCV: 92.3 fL (ref 80.0–100.0)
Platelets: 272 10*3/uL (ref 150–400)
RBC: 5.08 MIL/uL (ref 4.22–5.81)
RDW: 12.3 % (ref 11.5–15.5)
WBC: 5.9 10*3/uL (ref 4.0–10.5)
nRBC: 0 % (ref 0.0–0.2)

## 2023-09-17 LAB — HIV ANTIBODY (ROUTINE TESTING W REFLEX): HIV Screen 4th Generation wRfx: NONREACTIVE

## 2023-09-17 LAB — CK: Total CK: 398 U/L — ABNORMAL HIGH (ref 49–397)

## 2023-09-17 LAB — AMMONIA: Ammonia: 23 umol/L (ref 9–35)

## 2023-09-17 MED ORDER — PHENOBARBITAL SODIUM 130 MG/ML IJ SOLN
130.0000 mg | Freq: Once | INTRAMUSCULAR | Status: AC
  Administered 2023-09-17: 130 mg via INTRAVENOUS
  Filled 2023-09-17: qty 1

## 2023-09-17 MED ORDER — SODIUM CHLORIDE 0.9 % IV BOLUS
1000.0000 mL | Freq: Once | INTRAVENOUS | Status: AC
  Administered 2023-09-17: 1000 mL via INTRAVENOUS

## 2023-09-17 MED ORDER — HALOPERIDOL LACTATE 5 MG/ML IJ SOLN
INTRAMUSCULAR | Status: AC
  Administered 2023-09-17: 10 mg via INTRAVENOUS
  Filled 2023-09-17: qty 2

## 2023-09-17 MED ORDER — ENOXAPARIN SODIUM 40 MG/0.4ML IJ SOSY
40.0000 mg | PREFILLED_SYRINGE | INTRAMUSCULAR | Status: DC
  Administered 2023-09-17 – 2023-09-21 (×5): 40 mg via SUBCUTANEOUS
  Filled 2023-09-17 (×5): qty 0.4

## 2023-09-17 MED ORDER — SODIUM CHLORIDE 0.9 % IV SOLN
260.0000 mg | Freq: Once | INTRAVENOUS | Status: DC
  Filled 2023-09-17: qty 2

## 2023-09-17 MED ORDER — LORAZEPAM 2 MG/ML IJ SOLN: 1.0000 mg | Freq: Once | INTRAMUSCULAR | Status: DC

## 2023-09-17 MED ORDER — LORAZEPAM 2 MG/ML IJ SOLN
2.0000 mg | Freq: Once | INTRAMUSCULAR | Status: AC
  Administered 2023-09-17: 2 mg via INTRAMUSCULAR
  Filled 2023-09-17: qty 1

## 2023-09-17 MED ORDER — LORAZEPAM 2 MG/ML IJ SOLN
1.0000 mg | Freq: Once | INTRAMUSCULAR | Status: AC
  Administered 2023-09-17: 1 mg via INTRAMUSCULAR

## 2023-09-17 MED ORDER — ACETAMINOPHEN 325 MG PO TABS: 650.0000 mg | ORAL_TABLET | Freq: Four times a day (QID) | ORAL | Status: DC | PRN

## 2023-09-17 MED ORDER — HALOPERIDOL LACTATE 5 MG/ML IJ SOLN: 10.0000 mg | Freq: Once | INTRAMUSCULAR | Status: AC

## 2023-09-17 MED ORDER — LORAZEPAM 2 MG/ML IJ SOLN
INTRAMUSCULAR | Status: AC
  Filled 2023-09-17: qty 1

## 2023-09-17 MED ORDER — STERILE WATER FOR INJECTION IJ SOLN
INTRAMUSCULAR | Status: AC
  Filled 2023-09-17: qty 10

## 2023-09-17 MED ORDER — HALOPERIDOL LACTATE 5 MG/ML IJ SOLN: 10.0000 mg | Freq: Once | INTRAMUSCULAR | Status: DC

## 2023-09-17 MED ORDER — ZIPRASIDONE MESYLATE 20 MG IM SOLR
INTRAMUSCULAR | Status: AC
  Administered 2023-09-17: 20 mg via INTRAMUSCULAR
  Filled 2023-09-17: qty 20

## 2023-09-17 MED ORDER — DEXMEDETOMIDINE BOLUS VIA INFUSION
1.0000 ug/kg | Freq: Once | INTRAVENOUS | Status: DC
Start: 2023-09-17 — End: 2023-09-17

## 2023-09-17 MED ORDER — LORAZEPAM 2 MG/ML IJ SOLN
1.0000 mg | Freq: Once | INTRAMUSCULAR | Status: AC
  Administered 2023-09-17: 1 mg via INTRAMUSCULAR
  Filled 2023-09-17: qty 1

## 2023-09-17 MED ORDER — ACETAMINOPHEN 650 MG RE SUPP: 650.0000 mg | Freq: Four times a day (QID) | RECTAL | Status: DC | PRN

## 2023-09-17 MED ORDER — ZIPRASIDONE MESYLATE 20 MG IM SOLR
20.0000 mg | Freq: Once | INTRAMUSCULAR | Status: AC
Start: 2023-09-17 — End: 2023-09-17
  Filled 2023-09-17: qty 20

## 2023-09-17 MED ORDER — DEXMEDETOMIDINE HCL IN NACL 400 MCG/100ML IV SOLN: 0.0000 ug/kg/h | INTRAVENOUS | Status: DC

## 2023-09-17 MED ORDER — LORAZEPAM 2 MG/ML IJ SOLN
2.0000 mg | Freq: Once | INTRAMUSCULAR | Status: AC
  Administered 2023-09-20: 2 mg via INTRAVENOUS
  Filled 2023-09-17 (×2): qty 1

## 2023-09-17 MED ORDER — OLANZAPINE 10 MG IM SOLR
5.0000 mg | Freq: Once | INTRAMUSCULAR | Status: AC
  Administered 2023-09-17: 5 mg via INTRAMUSCULAR
  Filled 2023-09-17: qty 10

## 2023-09-17 NOTE — ED Triage Notes (Signed)
 BIB [facility] from [facility]. LKW 0815. Per EMS, he was found to have a left sided gaze but then possibly postictal. Patient is A/O x3 at baseline, patient has history of dementia.

## 2023-09-17 NOTE — ED Notes (Signed)
 Per [place], [place]  wait til pt is calm/asleep to place a new IV and restart IVF then get repeat lactic

## 2023-09-17 NOTE — ED Provider Notes (Signed)
 [place] AT [facility] Provider Note   CSN: [id number] Arrival date & time: 09/17/23  7829     History  Chief Complaint  Patient presents with   Altered Mental Status    Jason Stephenson is a 78 y.o. male with past medical history significant for dementia, partial small bowel obstruction remotely in 2023 who presents from [facility] assisted living.  Last known normal at 815 this morning.  Wife found him slumped over, try to arouse him, he was acting abnormally, somewhat unclear based on EMS report but possible seizure activity witnessed, at time of ED evaluation patient with no focal neurodeficits but is diffusely confused.   Altered Mental Status      Home Medications Prior to Admission medications   Medication Sig Start Date End Date Taking? Authorizing Provider  Cholecalciferol (VITAMIN D3) 50 MCG (2000 UT) TABS Take 2,000 Units by mouth daily with breakfast.    [provider]  donepezil  (ARICEPT ) 10 MG tablet Take 1 tablet (10 mg total) by mouth daily. Patient taking differently: Take 10 mg by mouth in the morning. 07/15/22   Wertman, Sara E, PA-C  finasteride  (PROSCAR ) 5 MG tablet Take 5 mg by mouth daily.    [provider]  memantine  (NAMENDA ) 10 MG tablet Take 1 tablet by mouth twice daily 01/04/23   Wertman, Sara E, PA-C  SEROQUEL  25 MG tablet Take 25 mg by mouth at bedtime. 06/08/22   [provider]  tadalafil (CIALIS) 5 MG tablet Take 5 mg by mouth daily.    [provider]      Allergies    Patient has no known allergies.    Review of Systems   Review of Systems  All other systems reviewed and are negative.   Physical Exam Updated Vital Signs BP 133/77 (BP Location: Right Arm)   Pulse (!) 54   Temp (!) 97.4 F (36.3 C) (Oral)   Resp 12   SpO2 98%  Physical Exam Vitals and nursing note reviewed.  Constitutional:      General: He is not in acute distress.    Appearance: Normal  appearance.  HENT:     Head: Normocephalic and atraumatic.  Eyes:     General:        Right eye: No discharge.        Left eye: No discharge.  Cardiovascular:     Rate and Rhythm: Normal rate and regular rhythm.     Heart sounds: No murmur heard.    No friction rub. No gallop.  Pulmonary:     Effort: Pulmonary effort is normal.     Breath sounds: Normal breath sounds.  Abdominal:     General: Bowel sounds are normal.     Palpations: Abdomen is soft.  Skin:    General: Skin is warm and dry.     Capillary Refill: Capillary refill takes less than 2 seconds.  Neurological:     Mental Status: He is alert and oriented to person, place, and time.     Comments: Difficult to obtain a full neuroexam on this patient because he is somewhat disoriented, altered on arrival, he is able to tell me his full name, he has no dysarthria, he is moving all 4 limbs spontaneously, he has no facial droop, normal coordination of the limbs and follows some commands, disoriented to place, time, situation, his speech is somewhat nonsensical.  Psychiatric:        Mood and Affect:  Mood normal.        Behavior: Behavior normal.     ED Results / Procedures / Treatments   Labs (all labs ordered are listed, but only abnormal results are displayed) Labs Reviewed  COMPREHENSIVE METABOLIC PANEL WITH GFR - Abnormal; Notable for the following components:      Result Value   Creatinine, Ser 1.40 (*)    Total Bilirubin 1.3 (*)    GFR, Estimated 52 (*)    All other components within normal limits  I-STAT CG4 LACTIC ACID, ED - Abnormal; Notable for the following components:   Lactic Acid, Venous 2.2 (*)    All other components within normal limits  CBC  AMMONIA  URINALYSIS, ROUTINE W REFLEX MICROSCOPIC  I-STAT CG4 LACTIC ACID, ED    EKG None  Radiology DG Chest Portable 1 View Result Date: 09/17/2023 CLINICAL DATA:  Altered mental status. EXAM: PORTABLE CHEST 1 VIEW COMPARISON:  February 08, 2023. FINDINGS:  The heart size and mediastinal contours are within normal limits. Both lungs are clear. The visualized skeletal structures are unremarkable. IMPRESSION: No active disease. Electronically Signed   By: Rosalene Colon M.D.   On: 09/17/2023 11:31    Procedures Procedures    Medications Ordered in ED Medications  LORazepam  (ATIVAN ) injection 2 mg (has no administration in time range)  PHENObarbital  (LUMINAL) injection 130 mg (has no administration in time range)  LORazepam  (ATIVAN ) injection 1 mg (1 mg Intramuscular Given 09/17/23 1003)  haloperidol  lactate (HALDOL ) injection 10 mg (10 mg Intravenous Given 09/17/23 1102)  sodium chloride  0.9 % bolus 1,000 mL (0 mLs Intravenous Stopped 09/17/23 1235)  ziprasidone  (GEODON ) injection 20 mg (20 mg Intramuscular Given 09/17/23 1236)  LORazepam  (ATIVAN ) injection 1 mg (1 mg Intramuscular Given 09/17/23 1237)  sterile water  (preservative free) injection (  Given 09/17/23 1236)  LORazepam  (ATIVAN ) injection 2 mg (2 mg Intramuscular Given 09/17/23 1411)    ED Course/ Medical Decision Making/ A&P Clinical Course as of 09/17/23 1539  Fri Sep 17, 2023  1450 Dr Verlene Glimpse with IM will examine patient and determine whether appropriate for their service [CP]  1522 S- pw AMS and syncope, w hx dementia, wife states he had syncopal, unable to go to CT 2/2 agitation, still needs urine and imaging [ ]  talk to crit care, if will not take then take Dr. Verlene Glimpse [AO]    Clinical Course User Index [AO] Lorain Robson, MD [CP] Nelly Banco, PA-C                                 Medical Decision Making Amount and/or Complexity of Data Reviewed Labs: ordered. Radiology: ordered.  Risk Prescription drug management.   This patient is a 78 y.o. male  who presents to the ED for concern of ams.   Differential diagnoses prior to evaluation: The emergent differential diagnosis includes, but is not limited to,  CVA, seizure, hypotension, sepsis, hypoglycemia, hypoxic  encephalopathy, metabolic encephalopathy, polypharmacy, substance abuse, developing dementia or alzheimers, meningitis, encephalitis, hypertensive emergency, other systemic infection, acute alcohol intoxication, acute alcohol or other drug withdrawal or psychiatric manifestation vs other . This is not an exhaustive differential.   Past Medical History / Co-morbidities / Social History: Dementia, otherwise overall very healthy, bowel obstruction in 2023  Physical Exam: Physical exam performed. The pertinent findings include: Difficult to obtain a full neuroexam on this patient because he is somewhat disoriented, altered on arrival,  he is able to tell me his full name, he has no dysarthria, he is moving all 4 limbs spontaneously, he has no facial droop, normal coordination of the limbs and follows some commands, disoriented to place, time, situation, his speech is somewhat nonsensical.   Hypertension on arrival, blood pressure 150/74, overall otherwise stable vital signs, mild bradycardia, pulse 55.  Lab Tests/Imaging studies: I personally interpreted labs/imaging and the pertinent results include: CBC unremarkable, normal ammonia, initial lactic acid is mildly elevated 2.2, CMP with mildly elevated creatinine at 1.4, total bilirubin at 1.3.  Suspect dehydration.  In completing his workup I would love to have a urinalysis repeat lactic and a head CT, however patient with agitated delirium, and remaining combative despite 10 mg Haldol , 2 mg total Ativan  and 20 mg Geodon  and fairly rapid timeframe.  Plain film chest x-ray with no evidence of acute intrathoracic abnormality.  I agree with the radiologist interpretation.  Cardiac monitoring: EKG obtained and interpreted by myself and attending physician which shows: Normal sinus rhythm, no acute ST-T changes to explain his symptoms   Medications: I ordered medication including we have attempted multiple rounds of sedation and patient still not tolerating  additional workup, has ripped out multiple IVs, and has been unable to go to CT, despite no clear etiology at this time I do think the patient will need admission for altered mental status of unclear etiology.  I have reviewed the patients home medicines and have made adjustments as needed.  Consults: I spoke with the hospitalist, Dr. Verlene Glimpse who expresses some concern given his significant requirements for sedation and request that we consult with critical care to see whether he would benefit from Precedex and critical care admission versus if he will respond to the sedation that we have given him in the emergency department.   3:39 PM Care of Jason Stephenson transferred to Resident MD, Lorain Robson  and Dr. Delana Favors at the end of my shift as the patient will require reassessment once labs/imaging have resulted. Patient presentation, ED course, and plan of care discussed with review of all pertinent labs and imaging. Please see his/her note for further details regarding further ED course and disposition. Plan at time of handoff is as above. This may be altered or completely changed at the discretion of the oncoming team pending results of further workup.   Final Clinical Impression(s) / ED Diagnoses Final diagnoses:  None    Rx / DC Orders ED Discharge Orders     None         Stefan Edge 09/17/23 1539    Dorenda Gandy, MD 09/18/23 [id number]

## 2023-09-17 NOTE — Hospital Course (Signed)
 Jason Stephenson is a 78 yo male with dementia who presents from ALF with altered mental status.   Worseneing dementia x years (in memory care due to agitation towards his wife) Normally is controlled at baseline, alert and oriented to self and place normally This AM was unresponsive for few moments, no seizure like activity  Has been agitated and altered since then

## 2023-09-17 NOTE — ED Notes (Addendum)
 CT attempt #2. Pt unable to lie still. Agitated & ripping everything off. Lynder Sanger, PA made aware & will talk with Dr Liam Redhead about possible new plan.

## 2023-09-17 NOTE — ED Notes (Signed)
 Pt still restless and ripping off everything to monitor. Attempts made to put back on pt, but pt gets extremely agitated.

## 2023-09-17 NOTE — H&P (Addendum)
 Date: 09/17/2023               Patient Name:  Jason Stephenson MRN: [id number]  DOB: 17-Mar-1946 Age / Sex: 78 y.o., male   PCP: Pcp, No         Medical Service: Internal Medicine Teaching Service         Attending Physician: Dr. Kirt Pereyra, MD      First Contact: Dr. Jose Ngo, MD     Second Contact: Dr. Lorelle Roll          First Contact Pager: [phone]   Second Contact Pager: [phone]   SUBJECTIVE   Chief Complaint:  Chief Complaint  Patient presents with   Altered Mental Status   History of Present Illness:  Jason Stephenson is a 27 y.o. who has a past medical history significant of hyperparathyroidism status post parathyroidectomy secondary to hypercalcemia, dementia, and small bowel obstruction who presented to the ED this morning after he was at ALF and had a syncopal event after having breakfast. SO at bedside, states he is very healthy and has not noted any changes in his behavior. No fevers, no changes in urination or bowel habits. No N/V.  About a week ago he presented with a bruise on his right lateral thigh and a knot on his hip.  X-rays were done and there were no signs of fracture.  Presumed unwitnessed fall.  Not on any anticoagulation.  Did not have signs of him hitting his head.  2 days later he went and ran and did not show any symptoms or signs of pain on his hip.  At baseline he has trouble stating what he needs as he has severe dysphagia and cannot pronounce words very well.  Per son who is his healthcare proxy he will sound like a toddler at home does not know how to speak completely yet.  Otherwise does not have any motor deficits.  Started having dementia about 7 or 8 years ago and has been a rapid decline in the last 2 years.  His dad had Alzheimer's disease.  Son has never been told what type of dementia father has.  But he does not have any coria and has not had hypertension.  He has been very healthy and used to be a Product/process development scientist.  His  most significant history of is an intestinal blockage that happened about 2 years ago.  Per significant other there has not been any changes to his normal behavior lately.  The ALF they were concerned of a stroke.  No presumed signs of a seizure.  He does get very agitated when he is sick but otherwise he is pretty redirectable.  ED Course:  He was afebrile, hypertensive 150/74 saturating 100% on room air respiratory rate 14.  Tooth IV twice, third IV in place after receiving 4 g milligrams of Ativan  10 mg IV of Haldol  and 20 mg IM of ziprasidone .   Meds:  No outpatient medications have been marked as taking for the 09/17/23 encounter [facility] Encounter).  Memantidine 10mg  daily  Seroquel    Past Medical History  Past Surgical History:  Procedure Laterality Date   COLONOSCOPY     LAPAROSCOPIC SMALL BOWEL RESECTION N/A 10/10/2021   Procedure: LAPAROSCOPIC SMALL BOWEL RESECTION;  Surgeon: Candyce Champagne, MD;  Location: [facility];  Service: General;  Laterality: N/A;  bilateral tap blocks    LAPAROSCOPY  10/10/2021   Procedure: LAPAROSCOPY DIAGNOSTIC;  Surgeon: Candyce Champagne, MD;  Location: [facility];  Service:  General;;   parathyroid surgery  04/21/2017   POLYPECTOMY      Social:  Lives With: Assisted living facility Support: Family, significant other at bedside.  Son is healthcare proxy Level of Function: Has severe expressive aphasia, has no baseline motor deficits. PCP: At ALF  Family History:   Alzheimer's in dad Breast cancer in sister  Allergies: Allergies as of 09/17/2023   (No Known Allergies)    Review of Systems: A complete ROS was negative except as per HPI.   OBJECTIVE:   Physical Exam: Blood pressure 133/77, pulse (!) 54, temperature (!) 97.4 F (36.3 C), temperature source Oral, resp. rate 12, SpO2 98%.  Constitutional: Chronically ill-appearing, somnolent HENT: normocephalic atraumatic, mucous membranes moist Eyes: conjunctiva non-erythematous Neck:  supple Cardiovascular: regular rate and rhythm, no m/r/g, no ble edema  Pulmonary/Chest: normal work of breathing on room air, lungs clear to auscultation bilaterally Abdominal: soft, non-tender, non-distended MSK: normal bulk and tone Neurological: alert & oriented to name only (baseline), unable to follow commands Skin: warm and dry, no rashes  Labs: CBC    Component Value Date/Time   WBC 5.9 09/17/2023 1019   RBC 5.08 09/17/2023 1019   HGB 15.8 09/17/2023 1019   HCT 46.9 09/17/2023 1019   PLT 272 09/17/2023 1019   MCV 92.3 09/17/2023 1019   MCH 31.1 09/17/2023 1019   MCHC 33.7 09/17/2023 1019   RDW 12.3 09/17/2023 1019   LYMPHSABS 1.7 07/16/2022 1510   MONOABS 0.9 07/16/2022 1510   EOSABS 0.3 07/16/2022 1510   BASOSABS 0.1 07/16/2022 1510    CMP     Component Value Date/Time   NA 141 09/17/2023 1019   K 4.2 09/17/2023 1019   CL 105 09/17/2023 1019   CO2 26 09/17/2023 1019   GLUCOSE 88 09/17/2023 1019   BUN 17 09/17/2023 1019   CREATININE 1.40 (H) 09/17/2023 1019   CALCIUM 9.2 09/17/2023 1019   PROT 7.0 09/17/2023 1019   ALBUMIN 4.0 09/17/2023 1019   AST 22 09/17/2023 1019   ALT 14 09/17/2023 1019   ALKPHOS 83 09/17/2023 1019   BILITOT 1.3 (H) 09/17/2023 1019   GFRNONAA 52 (L) 09/17/2023 1019   Imaging:  EKG: Prolonged PR interval, bradycardic  ASSESSMENT & PLAN:   Assessment & Plan by Problem: Principal Problem:   Altered mental status Active Problems:   Encephalopathy acute  Jason Stephenson is a 30 y.o. with a PMH of dementia with baseline expressive aphasia who presents after a syncopal event and worsening AMS after having breakfast this AM.   AMS  New event for him. Otherwise very healthy previously triathlete who presents with baseline AMS and syncopal event after having breakfast. He is afebrile, WBC WNL. Worsening AMS, not tachypneic. CXR shows not acute process. No electrolyte abnormalities. Has baseline CKD, Cr 1.4 BUN 17 no AG. Glucose is  88. T bili slightly elevated, but benign abdominal exam. Based on meds seems like he has BPH and hx of mixed incontinence. No suprapubic tenderness or CVA tenderness.  Could be urinary retention. EKG shows bradycardia and worsening PR interval- could be cardiac in nature. Alternatively, could be worsening dementia but due to acute onset unlikely. Less likely a bleed, hgb WNL but he may have had a fall recently. Could be that pain on the hip is causing behavioral changes but he was able to run after that. His leg was also not externally rotated to indicate a fracture.   -Ct head pending  -EEG  -Telemetry -UDS -CK,  Lactic acid -Echo -Hepatic function tests  -Bladder scan  -If fevers, blood cultures  -S/p 4mg  ativan , 10mg  Haldol , and Ziprasidone  IM.  -delirium precautions  -consider Benadryl  for sedation if needed -cw home meds of donepezil  10mg  daily, memantidine 10mg  BID, and seroquel  200mg  at bedtime after swallow eval, currently altered  -HIV, RPR  -TSH -B12 -NPO until SLP eval  BPH?  Mixed incontinence  No suprapubic tenderness on exam but patient is largely sedated. Will bladder scan and hold home meds of finasteride  5mg  daily and tadalafil 5mg  daily bc he is altered. -I/Os   Diet: NPO VTE: SCDs IVF: None,   Code: DNR, DNI, confirmed with HCP son, Pearson Bounds  Prior to Admission Living Arrangement:  ALF Anticipated Discharge Location:  ALF Barriers to Discharge: medical work-up  Dispo: Admit patient to Inpatient with expected length of stay greater than 2 midnights.  Signed: [facility]  Internal Medicine Resident, PGY-1 [name]  09/17/2023, 4:30 PM

## 2023-09-17 NOTE — ED Notes (Addendum)
 Patient is restless and pulling at mittens and lines. Disoriented x4, attempts at reorientation unsuccessful. MD Lydia Sams notified. Medications ordered (SEE MAR)

## 2023-09-17 NOTE — Consult Note (Signed)
 NAME:  Jason Stephenson, MRN:  [id number], DOB:  12-May-1946, LOS: 0 ADMISSION DATE:  09/17/2023, CONSULTATION DATE:  09/17/23 REFERRING MD:  Ulyess Gammons , CHIEF COMPLAINT:  AMS   History of Present Illness:  78 yo M hx dementia who presented to ED 09/17/23 from his care facility for AMS. Less responsive this morning than usual, found slumped over and with L gaze preference. In chart review there is question of possible sz like activity w EMS. In ED, has had variable mental status at times reportedly very agitated prohibiting ability to get CT H. He has gotten several small doses of BZD as well as 10mg  haldol  and 20mg  geodon .   PCCM is consulted for possible ICU admission and precedex initiation in this setting   Pertinent  Medical History  dementia  Significant Hospital Events: Including procedures, antibiotic start and stop dates in addition to other pertinent events   5/2 to ED for AMS   Interim History / Subjective:  Received total of 5mg  ativan  10 mg haldol  20mg  geodon    Objective   Blood pressure 133/77, pulse (!) 54, temperature (!) 97.4 F (36.3 C), temperature source Oral, resp. rate 12, SpO2 98%.       No intake or output data in the 24 hours ending 09/17/23 1614 There were no vitals filed for this visit.  Examination: General: Chronically ill elderly M NAD  HENT: temporal muscle wasting NCAT  Lungs: ctab  Cardiovascular: bradycardic  Abdomen: thin Extremities: no obvious acute joint deformity  Neuro: Sedate. Moves to tactile stimulation but is quickly falls asleep again. Pupils are pinpoint. Protecting airway.  GU: defer  Resolved Hospital Problem list     Assessment & Plan:   Acute encephalopathy superimposed on dementia with behavioral disturbance  Possible sz / post ictal state Elevated LA  -initially decr LOC, intermittently agitated in ED. Asleep at time of PCCM consult and eval. -his hx of being less responsive + gaze deviation followed by agitation does raise  question in my mind for possible sz and post ictal state  P -no indication for ICU level of care at this time and is stable for medicine admission.  -he is asleep. As such, I would not escalate sedating medications right now. -Would try to get CT H now.  -recommend eeg  -agree w UA. Consider bcx  -wife feels like he has been redirectable when agitated -- would try verbal de-escalation first before add'l pharmaco tx -if add'l pharmaco needed, could consider benadryl  which wouldn't implicate resp drive much.  -if he does get add'l medications which can decr resp drive, would consider etco2 monitoring.  -poor candidate for precedex with his bradycardia    PCCM will sign off. Please re-engage if pt clinical status changes or if we can be of further assistance   Best Practice (right click and "Reselect all SmartList Selections" daily)   Per primary   Labs   CBC: Recent Labs  Lab 09/17/23 1019  WBC 5.9  HGB 15.8  HCT 46.9  MCV 92.3  PLT 272    Basic Metabolic Panel: Recent Labs  Lab 09/17/23 1019  NA 141  K 4.2  CL 105  CO2 26  GLUCOSE 88  BUN 17  CREATININE 1.40*  CALCIUM 9.2   GFR: CrCl cannot be calculated (Unknown ideal weight.). Recent Labs  Lab 09/17/23 1019 09/17/23 1026  WBC 5.9  --   LATICACIDVEN  --  2.2*    Liver Function Tests: Recent Labs  Lab 09/17/23  1019  AST 22  ALT 14  ALKPHOS 83  BILITOT 1.3*  PROT 7.0  ALBUMIN 4.0   No results for input(s): "LIPASE", "AMYLASE" in the last 168 hours. Recent Labs  Lab 09/17/23 1019  AMMONIA 23    ABG No results found for: "PHART", "PCO2ART", "PO2ART", "HCO3", "TCO2", "ACIDBASEDEF", "O2SAT"   Coagulation Profile: No results for input(s): "INR", "PROTIME" in the last 168 hours.  Cardiac Enzymes: No results for input(s): "CKTOTAL", "CKMB", "CKMBINDEX", "TROPONINI" in the last 168 hours.  HbA1C: Hgb A1c MFr Bld  Date/Time Value Ref Range Status  07/17/2022 12:00 AM 5.2 4.8 - 5.6 % Final     Comment:    (NOTE)         Prediabetes: 5.7 - 6.4         Diabetes: >6.4         Glycemic control for adults with diabetes: <7.0     CBG: No results for input(s): "GLUCAP" in the last 168 hours.  Review of Systems:   Unable to obtain, he is sedated  Past Medical History:  He,  has a past medical history of Dementia (HCC) (10/08/2021).   Surgical History:   Past Surgical History:  Procedure Laterality Date   COLONOSCOPY     LAPAROSCOPIC SMALL BOWEL RESECTION N/A 10/10/2021   Procedure: LAPAROSCOPIC SMALL BOWEL RESECTION;  Surgeon: Candyce Champagne, MD;  Location: [facility];  Service: General;  Laterality: N/A;  bilateral tap blocks    LAPAROSCOPY  10/10/2021   Procedure: LAPAROSCOPY DIAGNOSTIC;  Surgeon: Candyce Champagne, MD;  Location: [facility];  Service: General;;   parathyroid surgery  04/21/2017   POLYPECTOMY       Social History:   reports that he has quit smoking. He has never used smokeless tobacco. He reports current alcohol use. He reports that he does not use drugs.   Family History:  His family history includes Stroke in his father. There is no history of Colon cancer, Colon polyps, Esophageal cancer, Rectal cancer, or Stomach cancer.   Allergies No Known Allergies   Home Medications  Prior to Admission medications   Medication Sig Start Date End Date Taking? Authorizing Provider  Cholecalciferol (VITAMIN D3) 50 MCG (2000 UT) TABS Take 2,000 Units by mouth daily with breakfast.    [provider]  donepezil  (ARICEPT ) 10 MG tablet Take 1 tablet (10 mg total) by mouth daily. Patient taking differently: Take 10 mg by mouth in the morning. 07/15/22   Wertman, Sara E, PA-C  finasteride  (PROSCAR ) 5 MG tablet Take 5 mg by mouth daily.    [provider]  memantine  (NAMENDA ) 10 MG tablet Take 1 tablet by mouth twice daily 01/04/23   Wertman, Sara E, PA-C  SEROQUEL  25 MG tablet Take 25 mg by mouth at bedtime. 06/08/22   [provider]  tadalafil (CIALIS) 5  MG tablet Take 5 mg by mouth daily.    [provider]     Critical care time: n/a       Eston Hence MSN, AGACNP-BC [facility] Pulmonary/Critical Care Medicine Amion for pager  09/17/2023, 4:14 PM

## 2023-09-17 NOTE — ED Notes (Addendum)
 Assumption of care, Patient is in mittens, two fingers fit into the circumference of the Velcro wrist straps. appears to be resting in bed, On cardiac monitor, NSR, respirations even and unlabored, on room air.

## 2023-09-18 ENCOUNTER — Inpatient Hospital Stay (HOSPITAL_COMMUNITY)

## 2023-09-18 DIAGNOSIS — R4182 Altered mental status, unspecified: Secondary | ICD-10-CM | POA: Diagnosis not present

## 2023-09-18 DIAGNOSIS — F02C11 Dementia in other diseases classified elsewhere, severe, with agitation: Secondary | ICD-10-CM | POA: Diagnosis not present

## 2023-09-18 DIAGNOSIS — R569 Unspecified convulsions: Secondary | ICD-10-CM

## 2023-09-18 DIAGNOSIS — N3946 Mixed incontinence: Secondary | ICD-10-CM | POA: Diagnosis not present

## 2023-09-18 LAB — COMPREHENSIVE METABOLIC PANEL WITH GFR
ALT: 14 U/L (ref 0–44)
AST: 30 U/L (ref 15–41)
Albumin: 3.7 g/dL (ref 3.5–5.0)
Alkaline Phosphatase: 85 U/L (ref 38–126)
Anion gap: 8 (ref 5–15)
BUN: 12 mg/dL (ref 8–23)
CO2: 28 mmol/L (ref 22–32)
Calcium: 8.6 mg/dL — ABNORMAL LOW (ref 8.9–10.3)
Chloride: 104 mmol/L (ref 98–111)
Creatinine, Ser: 1.19 mg/dL (ref 0.61–1.24)
GFR, Estimated: >60 mL/min (ref >60)
Glucose, Bld: 89 mg/dL (ref 70–99)
Potassium: 4.3 mmol/L (ref 3.5–5.1)
Sodium: 140 mmol/L (ref 135–145)
Total Bilirubin: 1.3 mg/dL — ABNORMAL HIGH (ref 0.0–1.2)
Total Protein: 6.4 g/dL — ABNORMAL LOW (ref 6.5–8.1)

## 2023-09-18 LAB — CBC
HCT: 44.8 % (ref 39.0–52.0)
Hemoglobin: 15.4 g/dL (ref 13.0–17.0)
MCH: 31.4 pg (ref 26.0–34.0)
MCHC: 34.4 g/dL (ref 30.0–36.0)
MCV: 91.2 fL (ref 80.0–100.0)
Platelets: 241 10*3/uL (ref 150–400)
RBC: 4.91 MIL/uL (ref 4.22–5.81)
RDW: 12.2 % (ref 11.5–15.5)
WBC: 9.2 10*3/uL (ref 4.0–10.5)
nRBC: 0 % (ref 0.0–0.2)

## 2023-09-18 LAB — T4, FREE: Free T4: 1.01 ng/dL (ref 0.61–1.12)

## 2023-09-18 LAB — TSH: TSH: 5.344 u[IU]/mL — ABNORMAL HIGH (ref 0.350–4.500)

## 2023-09-18 LAB — RPR: RPR Ser Ql: NONREACTIVE

## 2023-09-18 LAB — VITAMIN B12: Vitamin B-12: 272 pg/mL (ref 180–914)

## 2023-09-18 MED ORDER — OLANZAPINE 10 MG IM SOLR
5.0000 mg | Freq: Once | INTRAMUSCULAR | Status: AC
  Administered 2023-09-19: 5 mg via INTRAMUSCULAR
  Filled 2023-09-18: qty 10

## 2023-09-18 MED ORDER — OLANZAPINE 10 MG IM SOLR
5.0000 mg | Freq: Once | INTRAMUSCULAR | Status: AC
  Administered 2023-09-18: 5 mg via INTRAMUSCULAR
  Filled 2023-09-18: qty 10

## 2023-09-18 MED ORDER — STERILE WATER FOR INJECTION IJ SOLN
INTRAMUSCULAR | Status: AC
Start: 2023-09-18 — End: 2023-09-18
  Filled 2023-09-18: qty 10

## 2023-09-18 MED ORDER — FENTANYL CITRATE PF 50 MCG/ML IJ SOSY: PREFILLED_SYRINGE | INTRAMUSCULAR | Status: AC | PRN

## 2023-09-18 MED ORDER — LORAZEPAM 2 MG/ML IJ SOLN: 1.0000 mg | Freq: Once | INTRAMUSCULAR | Status: DC

## 2023-09-18 MED ORDER — OLANZAPINE 10 MG IM SOLR
5.0000 mg | Freq: Four times a day (QID) | INTRAMUSCULAR | Status: DC | PRN
  Filled 2023-09-18: qty 10

## 2023-09-18 MED ORDER — MIDAZOLAM HCL 2 MG/2ML IJ SOLN: INTRAMUSCULAR | Status: AC | PRN

## 2023-09-18 MED ORDER — LACTATED RINGERS IV BOLUS
1000.0000 mL | Freq: Once | INTRAVENOUS | Status: AC
  Administered 2023-09-18: 1000 mL via INTRAVENOUS

## 2023-09-18 MED ORDER — STERILE WATER FOR INJECTION IJ SOLN
INTRAMUSCULAR | Status: AC
  Filled 2023-09-18: qty 10

## 2023-09-18 NOTE — Progress Notes (Signed)
 Patient received from ED, alert, but did not answer orientation questions, very agitated, trying to pull the mittens off, vital signs stable, tele box connected, will continue to monitor

## 2023-09-18 NOTE — Evaluation (Signed)
 Physical Therapy Evaluation Patient Details Name: Jason Stephenson MRN: [id number] DOB: 10/24/1945 Today's Date: 09/18/2023  History of Present Illness  78 y.o. male who presents 5/2 after a syncopal event and worsening AMS after having breakfast on 09/18/2023. PMH of dementia with baseline expressive aphasia.  Clinical Impression  Pt admitted with above diagnosis. Patient unable to provide history. Wife states pt very active at baseline and even ran a one mile race at his memory care facility; prior triathlete. She was taking shopping regularly up until about 6 weeks ago when he developed aggressive behaviors. Would like to take him home after discharge but she wants to speak with patient's son. Patient currently requires min assist +2 for safety with all mobility. Able to ambulate with bil UE support and constant redirection to remain on task at hand.  Pt currently with functional limitations due to the deficits listed below (see PT Problem List). Pt will benefit from acute skilled PT to increase their independence and safety with mobility to allow discharge.           If plan is discharge home, recommend the following: A lot of help with walking and/or transfers;A lot of help with bathing/dressing/bathroom;Assistance with cooking/housework;Direct supervision/assist for medications management;Direct supervision/assist for financial management;Assist for transportation;Supervision due to cognitive status   Can travel by private vehicle        Equipment Recommendations Other (comment) (TBD, unlikely to recommend AD at this time.)  Recommendations for Other Services       Functional Status Assessment Patient has had a recent decline in their functional status and demonstrates the ability to make significant improvements in function in a reasonable and predictable amount of time.     Precautions / Restrictions Precautions Precautions: Fall Recall of Precautions/Restrictions:  Impaired Restrictions Weight Bearing Restrictions Per Provider Order: No      Mobility  Bed Mobility Overal bed mobility: Needs Assistance Bed Mobility: Supine to Sit, Sit to Supine     Supine to sit: Min assist Sit to supine: Min assist   General bed mobility comments: Min assist to sequence and facilitate in and out of bed. Pt restless and impulsive.    Transfers Overall transfer level: Needs assistance Equipment used: 2 person hand held assist Transfers: Sit to/from Stand Sit to Stand: Min assist, +2 safety/equipment           General transfer comment: Min assist +2 for safety with frequent cues for redirection and to faciliate rise.    Ambulation/Gait Ambulation/Gait assistance: Min assist, +2 safety/equipment Gait Distance (Feet): 100 Feet Assistive device: 2 person hand held assist Gait Pattern/deviations: Step-through pattern, Decreased stride length, Festinating, Trunk flexed Gait velocity: dec Gait velocity interpretation: <1.8 ft/sec, indicate of risk for recurrent falls   General Gait Details: Min assist +2 for safety wtih hand held support, intermittent assist for balance and direction to guide and facilitate forward steps. No buckling but is unsteady. Leaning forward excessively.  Stairs            Wheelchair Mobility     Tilt Bed    Modified Rankin (Stroke Patients Only)       Balance Overall balance assessment: Needs assistance Sitting-balance support: Feet supported, No upper extremity supported Sitting balance-Leahy Scale: Fair     Standing balance support: Bilateral upper extremity supported Standing balance-Leahy Scale: Poor  Pertinent Vitals/Pain Pain Assessment Pain Assessment: PAINAD Breathing: occasional labored breathing, short period of hyperventilation Negative Vocalization: occasional moan/groan, low speech, negative/disapproving quality Facial Expression: smiling or  inexpressive Body Language: tense, distressed pacing, fidgeting Consolability: distracted or reassured by voice/touch PAINAD Score: 4 Pain Intervention(s): Monitored during session    Home Living Family/patient expects to be discharged to:: Other (Comment) (Memory care) Living Arrangements: Other (Comment) ([facility]) Available Help at Discharge: Family Type of Home: House Home Access: Level entry       Home Layout: One level Home Equipment: None      Prior Function Prior Level of Function : Needs assist             Mobility Comments: Wife reports pt ambulatory, very active, ran a mile last week at his memory care center... does not use AD. ADLs Comments: Wifes tates pt has declined over the past 6 weeks. She was taking him out of memory care during the day to go shopping until he starting having behavioral issues. Lives at memory care, [facility]     Extremity/Trunk Assessment   Upper Extremity Assessment Upper Extremity Assessment: Defer to OT evaluation    Lower Extremity Assessment Lower Extremity Assessment: Difficult to assess due to impaired cognition       Communication   Communication Communication: Impaired Factors Affecting Communication: Difficulty expressing self    Cognition Arousal: Lethargic Behavior During Therapy: Restless, Agitated, Impulsive   PT - Cognitive impairments: Difficult to assess, History of cognitive impairments Difficult to assess due to: Level of arousal, Impaired communication                     PT - Cognition Comments: Wife reports not at his baseline. More confused than usual. Following commands: Impaired Following commands impaired: Follows one step commands inconsistently     Cueing Cueing Techniques: Verbal cues, Gestural cues, Tactile cues, Visual cues     General Comments      Exercises     Assessment/Plan    PT Assessment Patient needs continued PT services  PT Problem List  Decreased strength;Decreased activity tolerance;Decreased balance;Decreased mobility;Decreased coordination;Decreased cognition;Decreased knowledge of use of DME;Decreased safety awareness;Decreased knowledge of precautions       PT Treatment Interventions DME instruction;Gait training;Functional mobility training;Therapeutic activities;Therapeutic exercise;Balance training;Neuromuscular re-education;Cognitive remediation;Patient/family education    PT Goals (Current goals can be found in the Care Plan section)  Acute Rehab PT Goals Patient Stated Goal: none PT Goal Formulation: With family Time For Goal Achievement: 10/02/23 Potential to Achieve Goals: Fair    Frequency Min 2X/week     Co-evaluation               AM-PAC PT "6 Clicks" Mobility  Outcome Measure Help needed turning from your back to your side while in a flat bed without using bedrails?: A Little Help needed moving from lying on your back to sitting on the side of a flat bed without using bedrails?: A Little Help needed moving to and from a bed to a chair (including a wheelchair)?: A Little Help needed standing up from a chair using your arms (e.g., wheelchair or bedside chair)?: A Little Help needed to walk in hospital room?: A Lot Help needed climbing 3-5 steps with a railing? : Total 6 Click Score: 15    End of Session Equipment Utilized During Treatment: Gait belt Activity Tolerance: Patient tolerated treatment well Patient left: in bed;with call bell/phone within reach;with bed alarm set;with family/visitor  present (mitts on, fall mats in place) Nurse Communication: Mobility status PT Visit Diagnosis: Unsteadiness on feet (R26.81);Other abnormalities of gait and mobility (R26.89);Muscle weakness (generalized) (M62.81);Difficulty in walking, not elsewhere classified (R26.2);Other symptoms and signs involving the nervous system (R29.898)    Time: 6295-2841 PT Time Calculation (min) (ACUTE ONLY): 20  min   Charges:   PT Evaluation $PT Eval Moderate Complexity: 1 Mod   PT General Charges $$ ACUTE PT VISIT: 1 Visit         Jory Ng, PT, DPT [facility]  Rehabilitation Services Physical Therapist Office: [phone] Website: [email or web address]   Alinda Irani 09/18/2023, 1:48 PM

## 2023-09-18 NOTE — ED Notes (Addendum)
 Patient IV wrapped in coban gauze wrap. Patient self removing mittens and attempting to scoot out of bed. MD Lydia Sams notified. (See chart and MAR for new orders)

## 2023-09-18 NOTE — Progress Notes (Signed)
 Patient will not cooperate to lie still for echo due to Alzheimers.

## 2023-09-18 NOTE — Progress Notes (Signed)
 Patient is still restless and agitated, trying to get out of bed and was also trying to hit this nurse when attempting put him back to bed, gave him Zyprexa  5mg  as ordered, will continue to monitor

## 2023-09-18 NOTE — Progress Notes (Signed)
   HD#1 SUBJECTIVE:  Patient Summary: Jason Stephenson is a 78 y.o. with a PMH of dementia with baseline expressive aphasia who presents after a syncopal event and worsening AMS after having breakfast on 09/18/2023.   Overnight Events: Agitated and required Haldol  x 2, CT head with no signs of stroke, no seizures or post-ictal state on EEG, Remains afebrile, HIV negative, RPR pending, TSH mildly elevated, remains bradycardic.   Interim History: Pt too somnolent   OBJECTIVE:  Vital Signs: Vitals:   09/18/23 0510 09/18/23 0600 09/18/23 0700 09/18/23 1000  BP: (!) 138/91 127/74 119/73 (!) 156/101  Pulse: (!) 47 (!) 49 (!) 51 (!) 51  Resp: 15 14 10 12   Temp:   (!) 97.5 F (36.4 C)   TempSrc:      SpO2: 96% 99% 98% 100%   Supplemental O2: Room Air SpO2: 100 %  There were no vitals filed for this visit.   Intake/Output Summary (Last 24 hours) at 09/18/2023 1116 Last data filed at 09/18/2023 0150 Gross per 24 hour  Intake --  Output 500 ml  Net -500 ml   Net IO Since Admission: -500 mL [09/18/23 1116]  Physical Exam: Constitutional: Chronically ill-appearing, somnolent HENT: normocephalic atraumatic, mucous membranes dry Eyes: conjunctiva non-erythematous Neck: supple Cardiovascular: regular rate and rhythm, no m/r/g, no ble edema  Pulmonary/Chest: normal work of breathing on room air Abdominal: soft, non-tender, non-distended MSK: normal bulk and tone Neurological: Somnolent Skin: warm and dry  Patient Lines/Drains/Airways Status     Active Line/Drains/Airways     Name Placement date Placement time Site Days   Peripheral IV 09/17/23 20 G Left;Posterior Forearm 09/17/23  1520  Forearm  1   Peripheral IV 09/18/23 20 G Left Antecubital 09/18/23  0444  Antecubital  less than 1   Incision - 3 Ports Abdomen 1: Right;Lateral;Upper 2: Right;Medial;Lateral 3: Right;Lateral;Lower 10/10/21  --  -- 708             ASSESSMENT/PLAN:  Assessment: Principal Problem:    Altered mental status Active Problems:   Encephalopathy acute   Severe Alzheimer's dementia with agitation (HCC)  Plan:  AMS  Dementia with behavioral disturbance CT head shows no acute signs of stroke. Diffuse cerebral atrophy noted. Has FH of alzheimer's disease. Home meds include  donepezil  10mg  daily, memantidine 10mg  BID, and seroquel  200mg  at bedtime. Remains somnolent after sedation. Required haldol  ON x2.  Cannot complete swallow eval as he is too somnolent. Will remain NPO for now. Remains afebrile, WBC WNL, no electrolyte abnormalities. B12 WNL, TSH slightly elevated, will reflux to free T4, HIV negative, RPR pending. UDS negative, EEG not cw seizures or post-ictal state.  -1L LR bolus  -Echo pending    BPH?  Mixed incontinence  No suprapubic tenderness. Has not been retaining on bladder scans. Will bladder scan and hold home meds of finasteride  5mg  daily and tadalafil 5mg  daily bc he is altered. -I/Os  -bladder scans   Best Practice: Diet: NPO IVF: Fluids: LR, Rate:  1L VTE: enoxaparin  (LOVENOX ) injection 40 mg Start: 09/17/23 2200 Code: DNR/DNI DISPO: Anticipated discharge tomorrow to  ALF  pending  medical work-up .  Signature: [facility]  Internal Medicine Resident, PGY-1 [name]  11:16 AM, 09/18/2023   Please contact the on call pager after 5 pm and on weekends at [phone].

## 2023-09-18 NOTE — Progress Notes (Signed)
 Wasted remaining 5mg  of Zyprexa , witnessed by [place], Charity fundraiser

## 2023-09-18 NOTE — Progress Notes (Signed)
 EEG complete - results pending

## 2023-09-18 NOTE — Procedures (Signed)
 Patient Name: Bretton Mayne  MRN: [id number]  Epilepsy Attending: Arleene Lack  Referring Physician/Provider: Atway, Rayann N, DO  Date: 09/18/2023 Duration: 23.04 mins  Patient history: 78yo male with ams. EEG to evaluate for seizure  Level of alertness: Awake  AEDs during EEG study: None  Technical aspects: This EEG study was done with scalp electrodes positioned according to the 10-20 International system of electrode placement. Electrical activity was reviewed with band pass filter of 1-70Hz , sensitivity of 7 uV/mm, display speed of 32mm/sec with a 60Hz  notched filter applied as appropriate. EEG data were recorded continuously and digitally stored.  Video monitoring was available and reviewed as appropriate.  Description: The posterior dominant rhythm consists of 8Hz  activity of moderate voltage (25-35 uV) seen predominantly in posterior head regions, symmetric and reactive to eye opening and eye closing. EEG showed intermittent generalized 5 to 7 Hz theta slowing. Hyperventilation and photic stimulation were not performed.     ABNORMALITY - Intermittent slow, generalized  IMPRESSION: This study is suggestive of mild diffuse encephalopathy. No seizures or epileptiform discharges were seen throughout the recording.  Ghazal Pevey O Selvin Yun

## 2023-09-18 NOTE — Progress Notes (Signed)
 Patient is repeatedly trying to get OOB, climbing to the rails and is not redirectable, so kept him on Non-violent (Soft waist belt) restrain to keep him safe.

## 2023-09-18 NOTE — Progress Notes (Signed)
 SLP Cancellation Note  Patient Details Name: Jason Stephenson MRN: [id number] DOB: [birth year]   Cancelled treatment:       Reason Eval/Treat Not Completed: Patient's level of consciousness RN reports that patient has been very lethargic and advised to check back in in PM.  Jacqualine Mater, MA, [name] Speech Therapy

## 2023-09-18 NOTE — Evaluation (Signed)
 Clinical/Bedside Swallow Evaluation Patient Details  Name: Jason Stephenson MRN: [id number] Date of Birth: Apr 18, 1946  Today's Date: 09/18/2023 Time: SLP Start Time (ACUTE ONLY): 1445 SLP Stop Time (ACUTE ONLY): 1500 SLP Time Calculation (min) (ACUTE ONLY): 15 min  Past Medical History:  Past Medical History:  Diagnosis Date   Dementia (HCC) 10/08/2021   Past Surgical History:  Past Surgical History:  Procedure Laterality Date   COLONOSCOPY     LAPAROSCOPIC SMALL BOWEL RESECTION N/A 10/10/2021   Procedure: LAPAROSCOPIC SMALL BOWEL RESECTION;  Surgeon: Candyce Champagne, MD;  Location: [facility];  Service: General;  Laterality: N/A;  bilateral tap blocks    LAPAROSCOPY  10/10/2021   Procedure: LAPAROSCOPY DIAGNOSTIC;  Surgeon: Candyce Champagne, MD;  Location: [facility];  Service: General;;   parathyroid surgery  04/21/2017   POLYPECTOMY     HPI:  Patient is a 78 y.o. male with PMH: dementia with baseline expressive aphasia,  hyperparathyroidism status post parathyroidectomy secondary to hypercalcemia. He is in a memory care ALF [facility]). He presented to the hospital on 09/17/23 after a syncopal event after having breakfast.    Assessment / Plan / Recommendation  Clinical Impression  Patient presents with a suspected cognitive-based dysphagia as per this bedside swallow evaluation, however no overt s/s aspiration observed with liquids or solids. He fully masticated solids and although initially exhibiting a delay in drawing liquid through straw, he then proceeded to take large, ,successive straw sips of water  without difficulty or observed delay. No oral residuals s/p swallow. SLP recommending initiate PO diet of Dys 3 (mechanical soft) solids and thin liquids and will plan to f/u at least once to ensure toleration. SLP Visit Diagnosis: Dysphagia, unspecified (R13.10)    Aspiration Risk  Mild aspiration risk    Diet Recommendation Dysphagia 3 (Mech soft);Thin liquid    Liquid  Administration via: Cup;Straw Medication Administration: Whole meds with puree Supervision: Staff to assist with self feeding;Full supervision/cueing for compensatory strategies Compensations: Slow rate;Small sips/bites;Minimize environmental distractions    Other  Recommendations Oral Care Recommendations: Oral care BID    Recommendations for follow up therapy are one component of a multi-disciplinary discharge planning process, led by the attending physician.  Recommendations may be updated based on patient status, additional functional criteria and insurance authorization.  Follow up Recommendations No SLP follow up      Assistance Recommended at Discharge    Functional Status Assessment    Frequency and Duration min 1 x/week  1 week       Prognosis Prognosis for improved oropharyngeal function: Good Barriers to Reach Goals: Cognitive deficits      Swallow Study   General Date of Onset: 09/17/23 HPI: Patient is a 78 y.o. male with PMH: dementia with baseline expressive aphasia,  hyperparathyroidism status post parathyroidectomy secondary to hypercalcemia. He is in a memory care ALF [facility]). He presented to the hospital on 09/17/23 after a syncopal event after having breakfast. Type of Study: Bedside Swallow Evaluation Previous Swallow Assessment: none found Diet Prior to this Study: NPO Temperature Spikes Noted: No Respiratory Status: Room air History of Recent Intubation: No Behavior/Cognition: Alert;Cooperative;Requires cueing;Confused Oral Care Completed by SLP: No Oral Cavity - Dentition: Adequate natural dentition Self-Feeding Abilities: Total assist Patient Positioning: Upright in bed Baseline Vocal Quality: Normal Volitional Cough: Cognitively unable to elicit Volitional Swallow: Unable to elicit    Oral/Motor/Sensory Function Overall Oral Motor/Sensory Function: Within functional limits   Ice Chips     Thin Liquid Thin Liquid: Within  functional  limits Presentation: Straw    Nectar Thick     Honey Thick     Puree Puree: Within functional limits Presentation: Spoon   Solid     Solid: Within functional limits     Jacqualine Mater, MA, [name] Speech Therapy

## 2023-09-18 NOTE — Plan of Care (Signed)

## 2023-09-19 LAB — CBC
HCT: 43.6 % (ref 39.0–52.0)
Hemoglobin: 15.3 g/dL (ref 13.0–17.0)
MCH: 31.1 pg (ref 26.0–34.0)
MCHC: 35.1 g/dL (ref 30.0–36.0)
MCV: 88.6 fL (ref 80.0–100.0)
Platelets: 269 10*3/uL (ref 150–400)
RBC: 4.92 MIL/uL (ref 4.22–5.81)
RDW: 12.2 % (ref 11.5–15.5)
WBC: 9.1 10*3/uL (ref 4.0–10.5)
nRBC: 0 % (ref 0.0–0.2)

## 2023-09-19 LAB — BASIC METABOLIC PANEL WITH GFR
Anion gap: 9 (ref 5–15)
BUN: 12 mg/dL (ref 8–23)
CO2: 24 mmol/L (ref 22–32)
Calcium: 8.7 mg/dL — ABNORMAL LOW (ref 8.9–10.3)
Chloride: 105 mmol/L (ref 98–111)
Creatinine, Ser: 1.16 mg/dL (ref 0.61–1.24)
GFR, Estimated: >60 mL/min (ref >60)
Glucose, Bld: 113 mg/dL — ABNORMAL HIGH (ref 70–99)
Potassium: 3.6 mmol/L (ref 3.5–5.1)
Sodium: 138 mmol/L (ref 135–145)

## 2023-09-19 LAB — MAGNESIUM: Magnesium: 2 mg/dL (ref 1.7–2.4)

## 2023-09-19 LAB — PHOSPHORUS: Phosphorus: 3.5 mg/dL (ref 2.5–4.6)

## 2023-09-19 MED ORDER — QUETIAPINE FUMARATE 200 MG PO TABS
200.0000 mg | ORAL_TABLET | Freq: Every day | ORAL | Status: DC
  Administered 2023-09-19 – 2023-09-21 (×3): 200 mg via ORAL
  Filled 2023-09-19 (×3): qty 1

## 2023-09-19 MED ORDER — SERTRALINE HCL 50 MG PO TABS
75.0000 mg | ORAL_TABLET | Freq: Every day | ORAL | Status: DC
  Administered 2023-09-19 – 2023-09-21 (×3): 75 mg via ORAL
  Filled 2023-09-19 (×4): qty 2

## 2023-09-19 MED ORDER — LORAZEPAM 0.5 MG PO TABS: 0.5000 mg | ORAL_TABLET | ORAL | Status: DC | PRN

## 2023-09-19 MED ORDER — SERTRALINE HCL 50 MG PO TABS: 25.0000 mg | ORAL_TABLET | Freq: Every day | ORAL | Status: DC

## 2023-09-19 MED ORDER — LORAZEPAM 0.5 MG PO TABS
0.5000 mg | ORAL_TABLET | Freq: Four times a day (QID) | ORAL | Status: DC | PRN
  Administered 2023-09-19: 0.5 mg via ORAL
  Filled 2023-09-19 (×2): qty 1

## 2023-09-19 MED ORDER — STERILE WATER FOR INJECTION IJ SOLN
INTRAMUSCULAR | Status: AC
  Administered 2023-09-19: 2.1 mL
  Filled 2023-09-19: qty 10

## 2023-09-19 MED ORDER — SERTRALINE HCL 50 MG PO TABS: 50.0000 mg | ORAL_TABLET | Freq: Every morning | ORAL | Status: DC

## 2023-09-19 NOTE — Progress Notes (Signed)
   HD#2 SUBJECTIVE:  Patient Summary: Jason Stephenson is a 78 y.o. with a PMH of dementia with baseline expressive aphasia who presents after a syncopal event and worsening AMS after having breakfast on 09/18/2023.   Overnight Events: Continued to get agitated overnight and required 5 mg of zyprexa . Also placed in a waist belt for safety.   Interim History: Seen at bedside. He was sleeping/resting comfortably.   OBJECTIVE:  Vital Signs: Vitals:   09/18/23 1710 09/18/23 2021 09/19/23 0008 09/19/23 0428  BP: (!) 164/73 132/70 (!) 142/68 138/72  Pulse: 89 75 68 70  Resp: 17 18 17 18   Temp: 98.7 F (37.1 C) 98.7 F (37.1 C) 98.6 F (37 C) 98.4 F (36.9 C)  TempSrc:  Oral Oral Oral  SpO2: 99%  93% 97%   Supplemental O2: Room Air SpO2: 97 %  There were no vitals filed for this visit.   Intake/Output Summary (Last 24 hours) at 09/19/2023 0620 Last data filed at 09/18/2023 1630 Gross per 24 hour  Intake 150 ml  Output 600 ml  Net -450 ml   Net IO Since Admission: -950 mL [09/19/23 0620]  Physical Exam: General: Chronically ill appearing male laying in bed, resting comfortably Pulmonary: Normal work of breathing on room air Extremities: Moves extremities spontaneously Neuro: Somnolent, but only oriented to name at baseline. Does not follow instructions.  Psych: Normal mood and affect     ASSESSMENT/PLAN:  Assessment: Principal Problem:   Altered mental status Active Problems:   Encephalopathy acute   Severe Alzheimer's dementia with agitation (HCC)   Plan: Altered mental status Severe Alzheiemer's dementia with agitation Workup thus far has been negative for reversible causes of altered mental status: no metabolic, infectious, structural, or toxic etiology has been identified. EEG negative. Suspect this is progression of the patient's Alzheimer's, now with behavioral disturbances. Has continued to require PRN medications to help with agitation, including zyprexa   overnight.  - Ativan  0.5 mg q6h PRN for anxiety - Seroquel  200 mg at bedtime  - Zoloft 75 mg daily  - Can use Zyprexa  5 mg IM if needed for agitation - Continue soft wrist restraints and soft waist belt constraints   Syncope Had reported syncopal event at SNF  BPH Holding home finasteride  and tadalafil in the setting of AMS. No urinary retention seen thus far on bladder scans.    Best Practice: Diet: DYS IVF: Fluids: none VTE: enoxaparin  (LOVENOX ) injection 40 mg Start: 09/17/23 2200 Code: DNR/DNI AB: None Therapy Recs: Return to memory care with PT Family Contact: son/wife, to be notified. DISPO: Anticipated discharge to Nursing Home pending  placement .  Signature: Albertina Leise, D.O.  Internal Medicine Resident, PGY-3 [name]  Pager: [phone] 6:20 AM, 09/19/2023   Please contact the on call pager after 5 pm and on weekends at [phone].

## 2023-09-19 NOTE — Progress Notes (Signed)
 OT Cancellation Note  Patient Details Name: Jason Stephenson MRN: [id number] DOB: 10-24-1945   Cancelled Treatment:    Reason Eval/Treat Not Completed: Other (comment) Per RN, patient has been agitated and confused throughout the evening, and is currently sleeping. OT will follow back to complete evaluation when more appropriate and able to participate safely.   Mollie Anger E. Chalise Pe, OTR/L Acute Rehabilitation Services [phone]   Vincent Greek 09/19/2023, 8:22 AM

## 2023-09-19 NOTE — TOC Initial Note (Addendum)
 Transition of Care [facility]) - Initial/Assessment Note    Patient Details  Name: Jason Stephenson MRN: [id number] Date of Birth: 1946-05-17  [facility]) CM/SW Contact:    Umer Harig A [place], LCSW Phone Number: 09/19/2023, 1:31 PM  Clinical Narrative:                  CSW contacted pt's son Autry Legions who confirmed with CSW that pt is from [facility].  CSW followed up with pt's wife, Alvy Baar. She said that she would prefer to take pt home at DC because pt needs more supervision than facility can provide while pt is "weak." She contacted [facility] and requested pt DC home then return to facility, which they were agreeable to. She said she is able to manage pt at home and would not take him home if she did not think she could manage him.   CSW notified provider of pt's wife requested DC plan.   PT recommendation for home health. OT eval pending.   RNCM notified.    TOC will continue to follow.    Expected Discharge Plan: ALF, Memory Care Barriers to Discharge: Continued Medical Work up, Can't DC back to facility on weekend.   Patient Goals and CMS Choice            Expected Discharge Plan and Services                                              Prior Living Arrangements/Services   Living with: Facility resident                   Activities of Daily Living      Permission Sought/Granted                  Emotional Assessment   Attitude/Demeanor/Rapport: Lethargic Affect (typically observed): Quiet Orientation: : Fluctuating Orientation (Suspected and/or reported Sundowners) (disoriented x4) Alcohol / Substance Use: Not Applicable Psych Involvement: No (comment)  Admission diagnosis:  Altered mental status [R41.82] Patient Active Problem List   Diagnosis Date Noted   Altered mental status 09/17/2023   Encephalopathy acute 09/17/2023   Severe Alzheimer's dementia with agitation (HCC) 09/17/2023   Ileus (HCC)  07/16/2022   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 10/10/2021   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 [place] [address] Phone: [phone] Fax: [phone]     Social Drivers of Health (SDOH) Social History: SDOH Screenings   Food Insecurity: No Food Insecurity (09/18/2023)  Housing: Unknown (09/18/2023)  Transportation Needs: No Transportation Needs (09/18/2023)  Utilities: Not At Risk (09/18/2023)  Social Connections: Unknown (09/18/2023)  Tobacco Use: Medium Risk (04/27/2023)   SDOH Interventions:     Readmission Risk Interventions     No  data to display

## 2023-09-19 NOTE — TOC Progression Note (Signed)
 Transition of Care [facility]) - Progression Note    Patient Details  Name: Jason Stephenson MRN: [id number] Date of Birth: 1945/12/16  Transition of Care [facility]) CM/SW Contact  Lanice Folden A [place], LCSW Phone Number: 09/19/2023, 9:23 AM  Clinical Narrative:     CSW reached out to [facility] regarding possible DC today. Pt can't DC over the weekend, asked about non-violent restraints rule, Mona, NT, was unsure. CSW notified provider, possible DC tomorrow.    TOC will continue to follow.         Expected Discharge Plan and Services                                               Social Determinants of Health (SDOH) Interventions SDOH Screenings   Food Insecurity: No Food Insecurity (09/18/2023)  Housing: Unknown (09/18/2023)  Transportation Needs: No Transportation Needs (09/18/2023)  Utilities: Not At Risk (09/18/2023)  Social Connections: Unknown (09/18/2023)  Tobacco Use: Medium Risk (04/27/2023)    Readmission Risk Interventions     No data to display

## 2023-09-20 ENCOUNTER — Inpatient Hospital Stay (HOSPITAL_COMMUNITY)

## 2023-09-20 ENCOUNTER — Telehealth: Payer: Self-pay | Admitting: *Deleted

## 2023-09-20 DIAGNOSIS — R4182 Altered mental status, unspecified: Secondary | ICD-10-CM | POA: Diagnosis not present

## 2023-09-20 DIAGNOSIS — R55 Syncope and collapse: Secondary | ICD-10-CM | POA: Diagnosis not present

## 2023-09-20 DIAGNOSIS — N3946 Mixed incontinence: Secondary | ICD-10-CM | POA: Diagnosis not present

## 2023-09-20 DIAGNOSIS — F02C11 Dementia in other diseases classified elsewhere, severe, with agitation: Secondary | ICD-10-CM | POA: Diagnosis not present

## 2023-09-20 LAB — ECHOCARDIOGRAM COMPLETE
AR max vel: 1.88 cm2
AV Area VTI: 1.77 cm2
AV Area mean vel: 1.54 cm2
AV Mean grad: 2 mmHg
AV Peak grad: 4.2 mmHg
Ao pk vel: 1.02 m/s
Area-P 1/2: 2.81 cm2
S' Lateral: 3.4 cm
Single Plane A4C EF: 66 %

## 2023-09-20 MED ORDER — ENSURE ENLIVE PO LIQD
237.0000 mL | Freq: Two times a day (BID) | ORAL | Status: DC
  Administered 2023-09-20 – 2023-09-21 (×3): 237 mL via ORAL

## 2023-09-20 NOTE — Telephone Encounter (Signed)
 Copied from CRM [id number]. Topic: Clinical - Medical Advice >> Sep 20, 2023  1:02 PM Suzette B wrote: Reason for CRM: Patient's son Pearson Bounds [facility]) called in response to a conversation with Dr. Savino. Patient states he and Dr. Savino discussed that the patient should not be discharged to the patient's caretaker beverly lassister out of concern. The person is currently hospitalized, but the son is needing Dr. Savino to call him at [phone]

## 2023-09-20 NOTE — Care Management Important Message (Signed)
 Important Message  Patient Details  Name: Jason Stephenson MRN: [id number] Date of Birth: Jun 08, 1945   Important Message Given:        Wynonia Hedges 09/20/2023, 4:20 PM

## 2023-09-20 NOTE — Evaluation (Signed)
 Occupational Therapy Evaluation Patient Details Name: Jason Stephenson MRN: [id number] DOB: 07/01/1945 Today's Date: 09/20/2023   History of Present Illness   78 y.o. male who presents 5/2 after a syncopal event and worsening AMS after having breakfast on 09/18/2023. PMH of dementia with baseline expressive aphasia.     Clinical Impressions Pt is resident at [facility]. He typically wears an adult diaper and gets assist for bathing/dressing from staff for safety, but was very active and ambulating without DME. Per his wife he used to do Triathlons and ran a mile at [facility]. Today Pt is restless, different from his baseline - putting things in his mouth (biting and sucking on gown, fork) and rarely following directions despite multimodal cues. Pt attempting to get OOB when OT arrived, with difficulty OT got Pt to bathroom where he fixated on pulling the assist chord. Pt required mod A of 1 person or min of 2 people for in room mobility. OT with difficulty and LOTS of redirection got Pt from the bathroom to the recliner. He washed his hands and face with a washcloth, and required max A to self-feed. At this time due to decreased balance, cognition, safety awareness, strength - OT recommending post-acute rehab of <3 hours daily prior to return to [facility]. Wife who was present for last half of session in agreement. OT will follow acutely and next session will focus on functional ADL at sink and command following in conjunction with standing balance and activity tolerance.      If plan is discharge home, recommend the following:   A lot of help with walking and/or transfers;A lot of help with bathing/dressing/bathroom;Direct supervision/assist for medications management;Direct supervision/assist for financial management;Supervision due to cognitive status     Functional Status Assessment   Patient has had a recent decline in their functional status and/or demonstrates  limited ability to make significant improvements in function in a reasonable and predictable amount of time     Equipment Recommendations   Other (comment) (defer to next venue of care)     Recommendations for Other Services   PT consult;Speech consult     Precautions/Restrictions   Precautions Precautions: Fall Recall of Precautions/Restrictions: Impaired Restrictions Weight Bearing Restrictions Per Provider Order: No     Mobility Bed Mobility Overal bed mobility: Needs Assistance Bed Mobility: Supine to Sit     Supine to sit: Min assist     General bed mobility comments: Min assist to sequence and facilitate in and out of bed. Pt restless and impulsive.    Transfers Overall transfer level: Needs assistance Equipment used: 2 person hand held assist Transfers: Sit to/from Stand Sit to Stand: Min assist, +2 safety/equipment           General transfer comment: pt needed constant facilitation to mobilize. Min A +2 for sit to stand with safety. Upon return, pt tried to sit before getting all the way to chair, needed mod A to make it safely into chair      Balance Overall balance assessment: Needs assistance Sitting-balance support: Feet supported, No upper extremity supported Sitting balance-Leahy Scale: Fair     Standing balance support: Bilateral upper extremity supported Standing balance-Leahy Scale: Poor Standing balance comment: high fall risk and pt/wife without insight into this                           ADL either performed or assessed with clinical judgement  ADL Overall ADL's : Needs assistance/impaired Eating/Feeding: Maximal assistance;Sitting;Cueing for sequencing Eating/Feeding Details (indicate cue type and reason): Pt frequently dropping food from spoon. but successful 50% of the time with multimodal cues Grooming: Wash/dry face;Set up;Sitting Grooming Details (indicate cue type and reason): for simple tasks Upper Body  Bathing: Moderate assistance   Lower Body Bathing: Maximal assistance   Upper Body Dressing : Moderate assistance   Lower Body Dressing: Maximal assistance   Toilet Transfer: Minimal assistance;+2 for physical assistance;+2 for safety/equipment;Ambulation Toilet Transfer Details (indicate cue type and reason): hand held assist Toileting- Clothing Manipulation and Hygiene: Maximal assistance;Sit to/from stand       Functional mobility during ADLs: Minimal assistance;+2 for physical assistance;+2 for safety/equipment (HHA) General ADL Comments: Pt required constant re-direction and guidance.     Vision   Additional Comments: not assessed this session     Perception         Praxis         Pertinent Vitals/Pain Pain Assessment Pain Assessment: Faces Faces Pain Scale: No hurt Pain Intervention(s): Monitored during session, Repositioned     Extremity/Trunk Assessment Upper Extremity Assessment Upper Extremity Assessment: Generalized weakness   Lower Extremity Assessment Lower Extremity Assessment: Defer to PT evaluation   Cervical / Trunk Assessment Cervical / Trunk Assessment: Kyphotic   Communication Communication Communication: Impaired Factors Affecting Communication: Difficulty expressing self   Cognition Arousal: Alert Behavior During Therapy: Restless, Impulsive Cognition: History of cognitive impairments, Cognition impaired             OT - Cognition Comments: cognition impaired at baseline, but increased beehaviors observed this session. very difficult to redirect                 Following commands: Impaired Following commands impaired: Follows one step commands inconsistently (rarely)     Cueing  General Comments   Cueing Techniques: Verbal cues;Gestural cues;Tactile cues;Visual cues  discussed safety concerns with wife and that she is not prepared to prevent pt from falling or help him up if he does. She agreed that she does not feel  capable of keeping him safe at home and is agreeable to him going to SNF.   Exercises     Shoulder Instructions      Home Living Family/patient expects to be discharged to:: Other (Comment) (Memory care) Living Arrangements: Other (Comment) ([facility]) Available Help at Discharge: Family Type of Home: House Home Access: Level entry     Home Layout: One level     Bathroom Shower/Tub: Runner, broadcasting/film/video: None          Prior Functioning/Environment Prior Level of Function : Needs assist             Mobility Comments: Wife reports pt ambulatory, very active, ran a mile last week at his memory care center... does not use AD. ADLs Comments: Wife states pt has declined over the past 6 weeks. She was taking him out of memory care during the day to go shopping until he starting having behavioral issues. Lives at memory care, [facility]    OT Problem List: Decreased activity tolerance;Impaired balance (sitting and/or standing);Decreased cognition;Decreased safety awareness   OT Treatment/Interventions: Self-care/ADL training;Therapeutic activities;Patient/family education;Balance training;Cognitive remediation/compensation      OT Goals(Current goals can be found in the care plan section)   Acute Rehab OT Goals Patient Stated Goal: none stated OT Goal Formulation: With family Time For Goal Achievement:  10/04/23 Potential to Achieve Goals: Fair ADL Goals Pt Will Perform Eating: with supervision;sitting Pt Will Perform Grooming: with contact guard assist;standing Pt Will Perform Upper Body Dressing: with supervision;sitting Pt Will Perform Lower Body Dressing: with contact guard assist;sit to/from stand Pt Will Transfer to Toilet: with supervision;ambulating Pt Will Perform Toileting - Clothing Manipulation and hygiene: with contact guard assist;sit to/from stand   OT Frequency:  Min 2X/week    Co-evaluation               AM-PAC OT "6 Clicks" Daily Activity     Outcome Measure Help from another person eating meals?: A Lot Help from another person taking care of personal grooming?: A Lot Help from another person toileting, which includes using toliet, bedpan, or urinal?: A Lot Help from another person bathing (including washing, rinsing, drying)?: A Lot Help from another person to put on and taking off regular upper body clothing?: A Lot Help from another person to put on and taking off regular lower body clothing?: A Lot 6 Click Score: 12   End of Session Equipment Utilized During Treatment: Gait belt Nurse Communication: Mobility status;Precautions  Activity Tolerance: Patient tolerated treatment well Patient left: in chair;with call bell/phone within reach;with family/visitor present;with restraints reapplied (Bil mitts, posey belt)  OT Visit Diagnosis: Unsteadiness on feet (R26.81);Muscle weakness (generalized) (M62.81);Other symptoms and signs involving the nervous system (R29.898);Other symptoms and signs involving cognitive function                Time: 5284-1324 OT Time Calculation (min): 52 min Charges:  OT General Charges $OT Visit: 1 Visit OT Evaluation $OT Eval Moderate Complexity: 1 Mod OT Treatments $Self Care/Home Management : 8-22 mins Ch[place] OTR/L Acute Rehabilitation Services Office: [phone]  Ebony Goldstein [facility] 09/20/2023, 2:42 PM

## 2023-09-20 NOTE — Progress Notes (Signed)
 Attempted to call son with no success as he was requesting to speak with me. I messaged current team to follow up. Primary care team aware that legal appointed proxy is son, Pearson Bounds.

## 2023-09-20 NOTE — Discharge Summary (Incomplete)
 Name: Jason Stephenson MRN: [id number] DOB: August 10, 1945 78 y.o. PCP: Pcp, No  Date of Admission: 09/17/2023  9:53 AM Date of Discharge:  09/21/23 Attending Physician: Dr. Alwin Baars  DISCHARGE DIAGNOSIS:  Primary Problem: Altered mental status   Hospital Problems: Principal Problem:   Altered mental status Active Problems:   Encephalopathy acute   Severe Alzheimer's dementia with agitation (HCC)    DISCHARGE MEDICATIONS:   Allergies as of 09/21/2023   No Known Allergies      Medication List     TAKE these medications    cholecalciferol 25 MCG (1000 UNIT) tablet Commonly known as: VITAMIN D3 Take 1,000 Units by mouth in the morning.   docusate sodium 100 MG capsule Commonly known as: COLACE Take 100 mg by mouth in the morning.   donepezil  10 MG tablet Commonly known as: ARICEPT  Take 1 tablet (10 mg total) by mouth daily. What changed: when to take this   feeding supplement Liqd Take 237 mLs by mouth 2 (two) times daily between meals.   finasteride  5 MG tablet Commonly known as: PROSCAR  Take 5 mg by mouth in the morning.   LORazepam  0.5 MG tablet Commonly known as: ATIVAN  Take 0.5 mg by mouth as needed for anxiety.   memantine  10 MG tablet Commonly known as: NAMENDA  Take 1 tablet by mouth twice daily   pantoprazole 40 MG tablet Commonly known as: PROTONIX Take 40 mg by mouth 2 (two) times daily before a meal.   QUEtiapine  200 MG tablet Commonly known as: SEROQUEL  Take 200 mg by mouth at bedtime.   sertraline 25 MG tablet Commonly known as: ZOLOFT Take 3 tablets (75 mg total) by mouth daily. What changed:  how much to take when to take this additional instructions Another medication with the same name was removed. Continue taking this medication, and follow the directions you see here.        DISPOSITION AND FOLLOW-UP:  [name] was discharged from [facility] in stable condition. At the hospital follow up  visit please address:  Negative workup for altered mental status, favor progression of Alzheimers dementia. Discharge to prior memory facility with home health PT and no new medical treatment.  Follow-up Recommendations: Medications: Consider de escalation of medicines as appropriate  Follow-up Appointments:  Follow-up Information     [facility], [facility] Follow up.   Specialty: Home Health Services Why: Jason Stephenson will contact you for the first home visit Contact information: [address] [place] [address] [phone]                 HOSPITAL COURSE:  Patient Summary: [name] is a 78 yo male with dementia who presents from ALF with altered mental status.   This patient has worseneing dementia x years (in memory care due to agitation towards his wife). Normally he is controlled at baseline, alert and oriented to self and place normally. On morning of admission, was unresponsive for few moments, no seizure like activity.    Altered mental status Severe Alzheiemer's dementia with agitation Workup unrevealing for reversible causes of altered mental status: no metabolic, infectious, structural, or toxic etiology identified. EEG, CT head, UA, laboratory workup negative. Suspect this is progression of the patient's Alzheimer's, now with behavioral disturbances. He required PRN medications to help with agitation, including zyprexa  overnight. With completed workup, pt to be discharged to memory care facility. - Ativan  0.5 mg q6h PRN for anxiety - Seroquel  200 mg at  bedtime  - Zoloft 75 mg daily  - Zyprexa  5 mg IM if needed for agitation  At follow up please deescalate medicines as able, take efforts to maintain orientation, and provide assisted exercise.   Syncope Had reported syncopal event at SNF. Per above, unrevealing workup.   BPH Held home finasteride  and tadalafil in the setting of AMS. No urinary retention seen thus far on bladder scans.      DISCHARGE INSTRUCTIONS:   Discharge Instructions     Diet general   Complete by: As directed    Discharge instructions   Complete by: As directed    Please rest at home. Remember to open blinds and spend time outdoors as able to remain oriented to days/nights. Sertraline dose increased to 75mg  daily.   Increase activity slowly   Complete by: As directed        SUBJECTIVE:   [name] does not communicate much, most speech is nonsensical, but he looks me in the eye and does not appear to be in any distress.   Discharge Vitals:   BP 113/69 (BP Location: Right Arm)   Pulse 61   Temp 98.5 F (36.9 C) (Oral)   Resp 14   SpO2 95%   OBJECTIVE:  Physical Exam Constitutional:      General: He is not in acute distress.    Appearance: He is not ill-appearing.     Comments: Sleeping in bed, easy to awake.  Cardiovascular:     Rate and Rhythm: Normal rate and regular rhythm.  Pulmonary:     Effort: Pulmonary effort is normal.     Breath sounds: Normal breath sounds.  Abdominal:     General: Abdomen is flat. Bowel sounds are normal. There is no distension.  Skin:    General: Skin is warm and dry.  Neurological:     Mental Status: Mental status is at baseline. He is disoriented.     Comments: Communication is generally unintelligible but occasionally answers with short yes and no.  Psychiatric:        Mood and Affect: Mood normal.      Pertinent Labs, Studies, and Procedures:     Latest Ref Rng & Units 09/19/2023    7:04 AM 09/18/2023    4:18 AM 09/17/2023    6:45 PM  CBC  WBC 4.0 - 10.5 K/uL 9.1  9.2    Hemoglobin 13.0 - 17.0 g/dL 16.1  09.6  04.5   Hematocrit 39.0 - 52.0 % 43.6  44.8  45.0   Platelets 150 - 400 K/uL 269  241         Latest Ref Rng & Units 09/19/2023    7:04 AM 09/18/2023    4:18 AM 09/17/2023    6:45 PM  CMP  Glucose 70 - 99 mg/dL 409  89    BUN 8 - 23 mg/dL 12  12    Creatinine 8.11 - 1.24 mg/dL 9.14  7.82    Sodium 956 - 145 mmol/L 138  140  141    Potassium 3.5 - 5.1 mmol/L 3.6  4.3  3.9   Chloride 98 - 111 mmol/L 105  104    CO2 22 - 32 mmol/L 24  28    Calcium 8.9 - 10.3 mg/dL 8.7  8.6    Total Protein 6.5 - 8.1 g/dL  6.4    Total Bilirubin 0.0 - 1.2 mg/dL  1.3    Alkaline Phos 38 - 126 U/L  85  AST 15 - 41 U/L  30    ALT 0 - 44 U/L  14      EEG adult Result Date: 09/18/2023 Arleene Lack, MD     09/18/2023  6:25 AM Patient Name: Nathias Zuckerman MRN: [id number] Epilepsy Attending: Arleene Lack Referring Physician/Provider: Atway, Rayann N, DO Date: 09/18/2023 Duration: 23.04 mins Patient history: 78yo male with ams. EEG to evaluate for seizure Level of alertness: Awake AEDs during EEG study: None Technical aspects: This EEG study was done with scalp electrodes positioned according to the 10-20 International system of electrode placement. Electrical activity was reviewed with band pass filter of 1-70Hz , sensitivity of 7 uV/mm, display speed of 69mm/sec with a 60Hz  notched filter applied as appropriate. EEG data were recorded continuously and digitally stored.  Video monitoring was available and reviewed as appropriate. Description: The posterior dominant rhythm consists of 8Hz  activity of moderate voltage (25-35 uV) seen predominantly in posterior head regions, symmetric and reactive to eye opening and eye closing. EEG showed intermittent generalized 5 to 7 Hz theta slowing. Hyperventilation and photic stimulation were not performed.   ABNORMALITY - Intermittent slow, generalized IMPRESSION: This study is suggestive of mild diffuse encephalopathy. No seizures or epileptiform discharges were seen throughout the recording. Arleene Lack   CT Head Wo Contrast Result Date: 09/17/2023 CLINICAL DATA:  Mental status change, unknown cause EXAM: CT HEAD WITHOUT CONTRAST TECHNIQUE: Contiguous axial images were obtained from the base of the skull through the vertex without intravenous contrast. RADIATION DOSE REDUCTION: This exam was  performed according to the departmental dose-optimization program which includes automated exposure control, adjustment of the mA and/or kV according to patient size and/or use of iterative reconstruction technique. COMPARISON:  None Available. FINDINGS: Brain: Diffuse cerebral atrophy. No acute intracranial abnormality. Specifically, no hemorrhage, hydrocephalus, mass lesion, acute infarction, or significant intracranial injury. Vascular: No hyperdense vessel or unexpected calcification. Skull: No acute calvarial abnormality. Sinuses/Orbits: No acute findings Other: None IMPRESSION: Atrophy.  No acute intracranial abnormality. Electronically Signed   By: Janeece Mechanic M.D.   On: 09/17/2023 17:14   DG Chest Portable 1 View Result Date: 09/17/2023 CLINICAL DATA:  Altered mental status. EXAM: PORTABLE CHEST 1 VIEW COMPARISON:  February 08, 2023. FINDINGS: The heart size and mediastinal contours are within normal limits. Both lungs are clear. The visualized skeletal structures are unremarkable. IMPRESSION: No active disease. Electronically Signed   By: Rosalene Colon M.D.   On: 09/17/2023 11:31     Signed: Carleen Chary, DO Internal Medicine Resident, PGY-1 [name]  Pager: [phone] 1:57 PM, 09/21/2023

## 2023-09-20 NOTE — Progress Notes (Signed)
 Speech Language Pathology Treatment: Dysphagia  Patient Details Name: Jason Stephenson MRN: [id number] DOB: [birth year] Today's Date: 09/20/2023 Time: 4098-1191 SLP Time Calculation (min) (ACUTE ONLY): 10 min  Assessment / Plan / Recommendation Clinical Impression  Pt's wife at bedside and he refused po's with repeated attempts from SLP and wife's encouragement. Speaking in unintelligible sentences initially but when straw placed to lip or cracker he moved head and told therapist multiple times "no, just go." Per wife pt is discharging home with her and states she was told yesterday he is being discharged today. Pt's wife reports he has been "eating and drinking fine" without s/s aspiration. He is currently on a Dys 3 texture and educated she can give pt regular textures at home and ensure meats are moist and limit distractions. No further ST needed at this time.    HPI HPI: Patient is a 78 y.o. male with PMH: dementia with baseline expressive aphasia,  hyperparathyroidism status post parathyroidectomy secondary to hypercalcemia. He is in a memory care ALF [facility]). He presented to the hospital on 09/17/23 after a syncopal event after having breakfast.      SLP Plan  Continue with current plan of care      Recommendations for follow up therapy are one component of a multi-disciplinary discharge planning process, led by the attending physician.  Recommendations may be updated based on patient status, additional functional criteria and insurance authorization.    Recommendations  Diet recommendations: Dysphagia 3 (mechanical soft);Thin liquid (continue Dys due to not being able to observe with po's) Liquids provided via: Cup;Straw Medication Administration: Whole meds with puree Supervision: Staff to assist with self feeding;Full supervision/cueing for compensatory strategies Compensations: Slow rate;Small sips/bites;Minimize environmental distractions Postural Changes and/or  Swallow Maneuvers: Seated upright 90 degrees                  Oral care BID   Frequent or constant Supervision/Assistance Dysphagia, unspecified (R13.10)     Continue with current plan of care     Jason Stephenson  09/20/2023, 10:19 AM

## 2023-09-20 NOTE — Care Management Important Message (Signed)
 Important Message  Patient Details  Name: Derreck Fortuna MRN: [id number] Date of Birth: Jun 08, 1945   Important Message Given:        Wynonia Hedges 09/20/2023, 4:20 PM

## 2023-09-20 NOTE — Progress Notes (Signed)
 HD#3 SUBJECTIVE:  Patient Summary: Jason Stephenson is a 78 y.o. with a PMH of dementia with baseline expressive aphasia who presents after a syncopal event and worsening AMS after having breakfast on 09/18/2023.   Overnight Events and Interim History: NEO. Remains in waist belt, gloves for safety. Pleasant during rounds. Does not communicate intelligibly but in no acute distress. Wife at bedside reports no concerns.  Initial plan from family was to bring him home for care before returning to memory facility [facility], but not a good idea from discussion with other care members. Will pursue SNF as bridge to return to memory care.  OBJECTIVE:  Vital Signs: Vitals:   09/19/23 1928 09/20/23 0020 09/20/23 0359 09/20/23 1347  BP: 102/61 (!) 159/81 139/65 132/68  Pulse: (!) 52  (!) 51   Resp: 16 16 16    Temp: 98.3 F (36.8 C) 97.6 F (36.4 C) 99 F (37.2 C) 99.1 F (37.3 C)  TempSrc: Oral Oral Oral Oral  SpO2: 95% 96% 95% 95%   Supplemental O2: Room Air SpO2: 95 %  There were no vitals filed for this visit.   Intake/Output Summary (Last 24 hours) at 09/20/2023 1408 Last data filed at 09/20/2023 1000 Gross per 24 hour  Intake 120 ml  Output 350 ml  Net -230 ml   Net IO Since Admission: -960 mL [09/20/23 1408]  Physical Exam: General: Chronically ill appearing male laying in bed, resting comfortably Pulmonary: Normal work of breathing on room air Extremities: Moves extremities spontaneously Neuro: Somnolent, but only oriented to name at baseline. Does not follow instructions.  Psych: Normal mood and affect  Patient Lines/Drains/Airways Status     Active Line/Drains/Airways     Name Placement date Placement time Site Days   Peripheral IV 09/17/23 20 G Left;Posterior Forearm 09/17/23  1520  Forearm  3   Peripheral IV 09/18/23 20 G Left Antecubital 09/18/23  0444  Antecubital  2   External Urinary Catheter 09/18/23  1230  --  2   Incision - 3 Ports Abdomen 1:  Right;Lateral;Upper 2: Right;Medial;Lateral 3: Right;Lateral;Lower 10/10/21  --  -- 710             ASSESSMENT/PLAN:  Assessment: Principal Problem:   Altered mental status Active Problems:   Encephalopathy acute   Severe Alzheimer's dementia with agitation (HCC)  Jason Stephenson is a 78 y.o. with a PMH of dementia with baseline expressive aphasia who presents after a syncopal event and worsening AMS after having breakfast on 09/18/2023.   Workup unrevealing and felt pt exhibiting worsening of his Alzheimer's dementia. He is pending placement at a SNF before ultimately returning to his memory care facility [facility].  Plan: Altered mental status Severe Alzheimer's dementia with agitation Workup thus far has been negative for reversible causes of altered mental status: no metabolic, infectious, structural, or toxic etiology has been identified. EEG negative. Suspect this is progression of the patient's Alzheimer's, now with behavioral disturbances. Has continued to require PRN medications to help with agitation, including zyprexa  overnight.  - Ativan  0.5 mg q6h PRN for anxiety - Seroquel  200 mg at bedtime  - Zoloft 75 mg daily  - Can use Zyprexa  5 mg IM if needed for agitation - Continue soft wrist restraints and soft waist belt constraints  - Pending discharge to SNF, awaiting placement   Syncope Had reported syncopal event at SNF   BPH Holding home finasteride  and tadalafil in the setting of AMS. No urinary retention seen thus  far on bladder scans.     Best Practice: Diet: DYS IVF: Fluids: none VTE: enoxaparin  (LOVENOX ) injection 40 mg Start: 09/17/23 2200 Code: DNR/DNI AB: None Therapy Recs: Return to memory care with PT Family Contact: son/wife, to be notified. DISPO: Anticipated discharge to SNF pending  placement .  Signature: Carleen Chary, D.O.  Internal Medicine Resident, PGY-1 [name]  Pager: [phone] 2:08  PM, 09/20/2023   Please contact the on call pager after 5 pm and on weekends at [phone].

## 2023-09-20 NOTE — TOC Progression Note (Signed)
 Transition of Care [facility]) - Progression Note    Patient Details  Name: Jason Stephenson MRN: [id number] Date of Birth: 1945/07/23  Transition of Care [facility]) CM/SW Contact  Jonathan Neighbor, RN Phone Number: 09/20/2023, 11:55 AM  Clinical Narrative:     Pt is from [facility]. Wife doesn't feel he will get the care he needs at [facility] post hospitalization and she wants to take him home with therapies first and then return him to [facility] when he is more mobile. She asked for [facility] services at home. This has been arranged with Gasper Karst. Gasper Karst will contact her for the first home visit. Wife states there is a walker at home.  Awaiting therapies to re-see for additional DME needs.  TOC following.  Expected Discharge Plan: Home w Home Health Services Barriers to Discharge: Continued Medical Work up, SNF Pending bed offer, English as a second language teacher  Expected Discharge Plan and Services   Discharge Planning Services: CM Consult   Living arrangements for the past 2 months: Assisted Living Facility (Memory care)                           HH Arranged: PT, OT, Nurse's Aide HH Agency: [facility] Date [facility] Agency Contacted: 09/20/23   Representative spoke with at [facility] Agency: Randel Buss   Social Determinants of Health (SDOH) Interventions SDOH Screenings   Food Insecurity: No Food Insecurity (09/18/2023)  Housing: Unknown (09/18/2023)  Transportation Needs: No Transportation Needs (09/18/2023)  Utilities: Not At Risk (09/18/2023)  Social Connections: Unknown (09/18/2023)  Tobacco Use: Medium Risk (04/27/2023)    Readmission Risk Interventions     No data to display

## 2023-09-20 NOTE — Progress Notes (Signed)
*  PRELIMINARY RESULTS* Echocardiogram 2D Echocardiogram has been performed.  Jason Stephenson Jason Stephenson 09/20/2023, 9:00 AM

## 2023-09-20 NOTE — Progress Notes (Signed)
 Physical Therapy Treatment Patient Details Name: Jason Stephenson MRN: [id number] DOB: Apr 13, 1946 Today's Date: 09/20/2023   History of Present Illness 78 y.o. male who presents 5/2 after a syncopal event and worsening AMS after having breakfast on 09/18/2023. PMH of dementia with baseline expressive aphasia.    PT Comments  Pt received in chair, eating lunch with assist from wife. Pt following one step commands <10% of time, distracted by environment and difficult to redirect. Pt stood with min HHA +2. Had wife ambulate with pt to see if they would be safe together. She was unable to keep him moving forward safely and he had multiple LOB due to stuttering pattern and needed mod A from behind from therapist to prevent falling. Pt then given HHA from front by OT and was able to ambulate back to room with min A +2. Wife in agreement that she would be unable to keep pt safe at home. Patient will benefit from continued inpatient follow up therapy, <3 hours/day. PT will continue to follow.     If plan is discharge home, recommend the following: A lot of help with walking and/or transfers;A lot of help with bathing/dressing/bathroom;Assistance with cooking/housework;Direct supervision/assist for medications management;Direct supervision/assist for financial management;Assist for transportation;Supervision due to cognitive status   Can travel by private vehicle     No  Equipment Recommendations  None recommended by PT (too confusing for pt)    Recommendations for Other Services       Precautions / Restrictions Precautions Precautions: Fall Recall of Precautions/Restrictions: Impaired Precaution/Restrictions Comments: pt with no insight into safety and very difficult to redirect Restrictions Weight Bearing Restrictions Per Provider Order: No     Mobility  Bed Mobility               General bed mobility comments: pt received in chair    Transfers Overall transfer level: Needs  assistance Equipment used: 2 person hand held assist Transfers: Sit to/from Stand Sit to Stand: Min assist, +2 safety/equipment           General transfer comment: pt needed constant facilitation to mobilize. Min A +2 for sit to stand with safety. Upon return, pt tried to sit before getting all the way to chair, needed mod A to make it safely into chair    Ambulation/Gait Ambulation/Gait assistance: +2 safety/equipment, Mod assist Gait Distance (Feet): 60 Feet Assistive device: 2 person hand held assist Gait Pattern/deviations: Step-through pattern, Decreased stride length, Festinating, Trunk flexed Gait velocity: dec Gait velocity interpretation: <1.31 ft/sec, indicative of household ambulator   General Gait Details: encouraged wife to hold pt's hand and show PT/OT how she would do it at home. Wife unable to guide pt forward, pt with multiple LOB with mod A from therapist behind to prevent fall.   Stairs             Wheelchair Mobility     Tilt Bed    Modified Rankin (Stroke Patients Only)       Balance Overall balance assessment: Needs assistance Sitting-balance support: Feet supported, No upper extremity supported Sitting balance-Leahy Scale: Fair     Standing balance support: Bilateral upper extremity supported Standing balance-Leahy Scale: Poor Standing balance comment: high fall risk and pt without insight into this                            Communication Communication Communication: Impaired Factors Affecting Communication: Difficulty expressing self  Cognition Arousal: Alert  Behavior During Therapy: Restless, Impulsive   PT - Cognitive impairments: Orientation, Awareness, Memory, Attention, Initiation, Sequencing, Problem solving, Safety/Judgement                       PT - Cognition Comments: pt with minimal verbalization, Does not follow commands. Confused by tasks. Is distracted by environment. Pill rolling behaviors  noted Following commands: Impaired Following commands impaired: Follows one step commands inconsistently (rarely)    Cueing Cueing Techniques: Verbal cues, Gestural cues, Tactile cues, Visual cues  Exercises      General Comments General comments (skin integrity, edema, etc.): discussed safety concerns with wife and that she is not prepared to prevent pt from falling or help him up if he does. She agreed that she does not feel capable of keeping him safe at home and is agreeable to him going to SNF.      Pertinent Vitals/Pain Pain Assessment Pain Assessment: Faces Faces Pain Scale: No hurt    Home Living Family/patient expects to be discharged to:: Other (Comment) (Memory care) Living Arrangements: Other (Comment) ([facility]) Available Help at Discharge: Family Type of Home: House Home Access: Level entry       Home Layout: One level Home Equipment: None      Prior Function            PT Goals (current goals can now be found in the care plan section) Acute Rehab PT Goals Patient Stated Goal: pt unable to state. Wife verbally agreeable to pt going to SNF PT Goal Formulation: With family Time For Goal Achievement: 10/02/23 Potential to Achieve Goals: Fair Progress towards PT goals: Progressing toward goals    Frequency    Min 2X/week      PT Plan      Co-evaluation              AM-PAC PT "6 Clicks" Mobility   Outcome Measure  Help needed turning from your back to your side while in a flat bed without using bedrails?: A Little Help needed moving from lying on your back to sitting on the side of a flat bed without using bedrails?: A Little Help needed moving to and from a bed to a chair (including a wheelchair)?: A Little Help needed standing up from a chair using your arms (e.g., wheelchair or bedside chair)?: A Lot Help needed to walk in hospital room?: Total   6 Click Score: 12    End of Session Equipment Utilized During  Treatment: Gait belt Activity Tolerance: Patient tolerated treatment well Patient left: with call bell/phone within reach;with family/visitor present;in chair;with restraints reapplied (fall mats in place, posey) Nurse Communication: Mobility status PT Visit Diagnosis: Unsteadiness on feet (R26.81);Other abnormalities of gait and mobility (R26.89);Muscle weakness (generalized) (M62.81);Difficulty in walking, not elsewhere classified (R26.2);Other symptoms and signs involving the nervous system (R29.898)     Time: 1610-9604 PT Time Calculation (min) (ACUTE ONLY): 24 min  Charges:    $Gait Training: 8-22 mins $Therapeutic Activity: 8-22 mins PT General Charges $$ ACUTE PT VISIT: 1 Visit                     Amey Ka, PT  Acute Rehab Services Secure chat preferred Office [phone]    Deloris Fetters Aubriee Szeto 09/20/2023, 2:09 PM

## 2023-09-21 DIAGNOSIS — R4182 Altered mental status, unspecified: Secondary | ICD-10-CM | POA: Diagnosis not present

## 2023-09-21 MED ORDER — FINASTERIDE 5 MG PO TABS
5.0000 mg | ORAL_TABLET | Freq: Every day | ORAL | Status: DC
  Administered 2023-09-21: 5 mg via ORAL
  Filled 2023-09-21: qty 1

## 2023-09-21 MED ORDER — DONEPEZIL HCL 10 MG PO TABS
10.0000 mg | ORAL_TABLET | Freq: Every day | ORAL | Status: DC
  Administered 2023-09-21: 10 mg via ORAL
  Filled 2023-09-21: qty 1

## 2023-09-21 MED ORDER — SERTRALINE HCL 25 MG PO TABS: 75.0000 mg | ORAL_TABLET | Freq: Every day | ORAL | Status: AC

## 2023-09-21 MED ORDER — ENSURE ENLIVE PO LIQD: 237.0000 mL | Freq: Two times a day (BID) | ORAL | Status: AC

## 2023-09-21 MED ORDER — MEMANTINE HCL 10 MG PO TABS
10.0000 mg | ORAL_TABLET | Freq: Two times a day (BID) | ORAL | Status: DC
  Administered 2023-09-21 (×2): 10 mg via ORAL
  Filled 2023-09-21 (×2): qty 1

## 2023-09-21 MED ORDER — PANTOPRAZOLE SODIUM 40 MG PO TBEC
40.0000 mg | DELAYED_RELEASE_TABLET | Freq: Two times a day (BID) | ORAL | Status: DC
  Administered 2023-09-21: 40 mg via ORAL
  Filled 2023-09-21: qty 1

## 2023-09-21 NOTE — NC FL2 (Signed)
 [place]  MEDICAID FL2 LEVEL OF CARE FORM     IDENTIFICATION  Patient Name: Jason Stephenson Birthdate: 1945-07-28 Sex: male Admission Date (Current Location): 09/17/2023  [facility] and [place] Number:  Producer, television/film/video and Address:  [facility] [place], [address], [place], [place] [address]      Provider Number: [id number]  Attending Physician Name and Address:  Sandie Cross, MD  Relative Name and Phone Number:  Pearson Bounds [phone]    Current Level of Care: Hospital Recommended Level of Care: Memory Care Prior Approval Number:    Date Approved/Denied:   PASRR Number:    Discharge Plan: Other (Comment) (ALF/ memory care)    Current Diagnoses: Patient Active Problem List   Diagnosis Date Noted   Altered mental status 09/17/2023   Encephalopathy acute 09/17/2023   Severe Alzheimer's dementia with agitation (HCC) 09/17/2023   Ileus (HCC) 07/16/2022   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 10/10/2021   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 10/10/2021 10/10/2021   SBO (small bowel obstruction) (HCC) 10/09/2021   Partial small bowel obstruction (HCC) 10/08/2021   Dementia with behavioral disturbance (HCC) 10/08/2021   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/09/2017   Hyperparathyroidism (HCC) 03/09/2017   Hamstring strain 10/22/2015   Lateral epicondylitis of left elbow 06/28/2014   Greater trochanteric bursitis of left hip 01/23/2014   Lateral epicondylitis of right elbow 11/24/2011   LOW BACK PAIN, ACUTE 10/18/2008    Orientation RESPIRATION BLADDER Height & Weight     Self  Normal Incontinent, External catheter Weight:   Height:     BEHAVIORAL SYMPTOMS/MOOD NEUROLOGICAL BOWEL NUTRITION STATUS      Incontinent Diet (see DC summary)  AMBULATORY STATUS COMMUNICATION OF NEEDS Skin   Extensive Assist Verbally Other (Comment)                       Personal Care  Assistance Level of Assistance  Bathing, Feeding, Dressing Bathing Assistance: Maximum assistance Feeding assistance: Limited assistance Dressing Assistance: Maximum assistance     Functional Limitations Info  Sight, Hearing, Speech Sight Info: Adequate Hearing Info: Adequate Speech Info: Impaired    SPECIAL CARE FACTORS FREQUENCY  PT (By licensed PT), OT (By licensed OT)     PT Frequency: Eval and treat OT Frequency: Eval and treat            Contractures Contractures Info: Not present    Additional Factors Info  Psychotropic Code Status Info: DNR Allergies Info: NKA Psychotropic Info: Namenda  10 mg BID/ Seroquel  200 mg at bedtime/ Zoloft 75 mg daily/ Aricept  10 mg at bedtime         Current Medications (09/21/2023):  This is the current hospital active medication list Current Facility-Administered Medications  Medication Dose Route Frequency Provider Last Rate Last Admin   acetaminophen  (TYLENOL ) tablet 650 mg  650 mg Oral Q6H PRN Atway, Rayann N, DO       Or   acetaminophen  (TYLENOL ) suppository 650 mg  650 mg Rectal Q6H PRN Atway, Rayann N, DO       donepezil  (ARICEPT ) tablet 10 mg  10 mg Oral QHS Juberg, Christopher, DO       enoxaparin  (LOVENOX ) injection 40 mg  40 mg Subcutaneous Q24H Atway, Rayann N, DO   40 mg at 09/20/23 2132   feeding supplement (ENSURE ENLIVE / ENSURE PLUS) liquid 237 mL  237 mL Oral BID BM Priscella Brooms, DO  237 mL at 09/21/23 1040   finasteride  (PROSCAR ) tablet 5 mg  5 mg Oral Daily Juberg, Christopher, DO   5 mg at 09/21/23 1041   LORazepam  (ATIVAN ) tablet 0.5 mg  0.5 mg Oral Q6H PRN Tod Forward C, DO   0.5 mg at 09/19/23 1748   memantine  (NAMENDA ) tablet 10 mg  10 mg Oral BID Juberg, Christopher, DO   10 mg at 09/21/23 1040   pantoprazole (PROTONIX) EC tablet 40 mg  40 mg Oral BID AC Juberg, Christopher, DO       QUEtiapine  (SEROQUEL ) tablet 200 mg  200 mg Oral QHS Atway, Rayann N, DO   200 mg at 09/20/23 2132   sertraline (ZOLOFT)  tablet 75 mg  75 mg Oral Daily Hoffman, Erik C, DO   75 mg at 09/21/23 1040     Discharge Medications: cholecalciferol 25 MCG (1000 UNIT) tablet Commonly known as: VITAMIN D3 Take 1,000 Units by mouth in the morning.    docusate sodium 100 MG capsule Commonly known as: COLACE Take 100 mg by mouth in the morning.    donepezil  10 MG tablet Commonly known as: ARICEPT  Take 1 tablet (10 mg total) by mouth daily. What changed: when to take this    feeding supplement Liqd Take 237 mLs by mouth 2 (two) times daily between meals.    finasteride  5 MG tablet Commonly known as: PROSCAR  Take 5 mg by mouth in the morning.    LORazepam  0.5 MG tablet Commonly known as: ATIVAN  Take 0.5 mg by mouth as needed for anxiety.    memantine  10 MG tablet Commonly known as: NAMENDA  Take 1 tablet by mouth twice daily    pantoprazole 40 MG tablet Commonly known as: PROTONIX Take 40 mg by mouth 2 (two) times daily before a meal.    QUEtiapine  200 MG tablet Commonly known as: SEROQUEL  Take 200 mg by mouth at bedtime.    sertraline 25 MG tablet Commonly known as: ZOLOFT Take 3 tablets (75 mg total) by mouth daily. What changed:  how much to take when to take this additional instructions Another medication with the same name was removed. Continue taking this medication, and follow the directions you see here.    Relevant Imaging Results:  Relevant Lab Results:   Additional Information [id number]  Jonathan Neighbor, RN

## 2023-09-21 NOTE — Plan of Care (Signed)
 Had been agitated and restless, maintained on soft belt restraints, wanting to get up on bed, removed external urinary catheter, keep mittens on. Reoriented to keep on the bed.  Problem: Education: Goal: Knowledge of General Education information will improve Description: Including pain rating scale, medication(s)/side effects and non-pharmacologic comfort measures Outcome: Progressing   Problem: Health Behavior/Discharge Planning: Goal: Ability to manage health-related needs will improve Outcome: Progressing   Problem: Clinical Measurements: Goal: Will remain free from infection Outcome: Progressing   Problem: Activity: Goal: Risk for activity intolerance will decrease Outcome: Progressing   Problem: Nutrition: Goal: Adequate nutrition will be maintained Outcome: Progressing   Problem: Coping: Goal: Level of anxiety will decrease Outcome: Progressing   Problem: Safety: Goal: Ability to remain free from injury will improve Outcome: Progressing   Problem: Skin Integrity: Goal: Risk for impaired skin integrity will decrease Outcome: Progressing   Problem: Safety: Goal: Non-violent Restraint(s) Outcome: Progressing

## 2023-09-21 NOTE — Progress Notes (Signed)
 Patient transported via PTAR to [employer]

## 2023-09-21 NOTE — TOC Transition Note (Signed)
 Transition of Care [facility]) - Discharge Note   Patient Details  Name: Jason Stephenson MRN: [id number] Date of Birth: December 28, 1945  [facility]) CM/SW Contact:  Jonathan Neighbor, RN Phone Number: 09/21/2023, 3:12 PM   Clinical Narrative:     Pt is returning to [facility] today. He will transport via PTAR. All needed information sent to [facility].  Son has been updated and in agreement with the plan.   Number for report: [phone]  Final next level of care: Memory Care Barriers to Discharge: No Barriers Identified   Patient Goals and CMS Choice   CMS Medicare.gov Compare Post Acute Care list provided to:: Patient Represenative (must comment) Choice offered to / list presented to : Adult Children [place] ownership interest in [facility].provided to:: [facility] POA / Guardian    Discharge Placement                       Discharge Plan and Services Additional resources added to the After Visit Summary for     Discharge Planning Services: CM Consult                      HH Arranged: PT, OT, Speech Therapy HH Agency: Other - See comment International aid/development worker) Date [facility] Agency Contacted: 09/20/23   Representative spoke with at [facility] Agency: Randel Buss  Social Drivers of Health (SDOH) Interventions SDOH Screenings   Food Insecurity: No Food Insecurity (09/18/2023)  Housing: Unknown (09/18/2023)  Transportation Needs: No Transportation Needs (09/18/2023)  Utilities: Not At Risk (09/18/2023)  Social Connections: Unknown (09/18/2023)  Tobacco Use: Medium Risk (04/27/2023)     Readmission Risk Interventions     No data to display

## 2023-09-21 NOTE — Plan of Care (Signed)

## 2023-09-21 NOTE — Plan of Care (Signed)
 Wife at side.  Requires much redirecting.  Alert to self and very confused and does attempt to climb out of bed often.  Has to be redirected.  Asking wife to assist to try to distract him, as he does calm down some when she talks to him and distracts him.     Problem: Education: Goal: Knowledge of General Education information will improve Description: Including pain rating scale, medication(s)/side effects and non-pharmacologic comfort measures Outcome: Progressing   Problem: Clinical Measurements: Goal: Ability to maintain clinical measurements within normal limits will improve Outcome: Progressing Goal: Will remain free from infection Outcome: Progressing Goal: Diagnostic test results will improve Outcome: Progressing   Problem: Activity: Goal: Risk for activity intolerance will decrease Outcome: Progressing   Problem: Nutrition: Goal: Adequate nutrition will be maintained Outcome: Progressing   Problem: Safety: Goal: Non-violent Restraint(s) Outcome: Progressing

## 2023-09-21 NOTE — Progress Notes (Signed)
 HD#4 SUBJECTIVE:  Patient Summary: Jason Stephenson is a 78 y.o. with a PMH of dementia with baseline expressive aphasia who presents after a syncopal event and worsening AMS after having breakfast on 09/18/2023.   Overnight Events and Interim History: NEO. Remains in waist belt, gloves for safety. Pleasant during rounds, sleeping. Awakens easily. Communication unintelligible at times, but appears in no acute distress and denies discomfort..  OBJECTIVE:  Vital Signs: Vitals:   09/20/23 2011 09/20/23 2359 09/21/23 0340 09/21/23 0822  BP: (!) 144/78 120/69 123/68 (!) 129/90  Pulse: 68 73 73 65  Resp: 18 18 18 12   Temp: 98.5 F (36.9 C) 98.8 F (37.1 C) 98.4 F (36.9 C) 98.6 F (37 C)  TempSrc: Axillary Axillary Axillary Oral  SpO2: 94% 94% 94% 94%   Supplemental O2: Room Air SpO2: 94 %  There were no vitals filed for this visit.   Intake/Output Summary (Last 24 hours) at 09/21/2023 1028 Last data filed at 09/21/2023 0359 Gross per 24 hour  Intake --  Output 300 ml  Net -300 ml   Net IO Since Admission: -1,260 mL [09/21/23 1028]  Physical Exam: General: Chronically ill appearing male laying in bed, resting comfortably Pulmonary: Normal work of breathing on room air Extremities: Moves extremities spontaneously Neuro: Somnolent, but only oriented to name at baseline. Does not follow instructions.  Psych: Normal mood and affect  Patient Lines/Drains/Airways Status     Active Line/Drains/Airways     Name Placement date Placement time Site Days   Peripheral IV 09/17/23 20 G Left;Posterior Forearm 09/17/23  1520  Forearm  4   Peripheral IV 09/18/23 20 G Left Antecubital 09/18/23  0444  Antecubital  3   External Urinary Catheter 09/18/23  1230  --  3   Incision - 3 Ports Abdomen 1: Right;Lateral;Upper 2: Right;Medial;Lateral 3: Right;Lateral;Lower 10/10/21  --  -- 711             ASSESSMENT/PLAN:  Assessment: Principal Problem:   Altered mental status Active  Problems:   Encephalopathy acute   Severe Alzheimer's dementia with agitation (HCC)  Jason Stephenson is a 78 y.o. with a PMH of dementia with baseline expressive aphasia who presents after a syncopal event and worsening AMS after having breakfast on 09/18/2023.    Workup unrevealing and conclude that pt exhibits worsening of his Alzheimer's dementia. He is pending placement at a SNF before ultimately returning to his memory care facility [facility].  Plan: Altered mental status Severe Alzheimer's dementia with agitation Workup negative for reversible causes of altered mental status: no metabolic, infectious, structural, or toxic etiology has been identified. EEG, CT, Echo, metabolics negative. This is progression of the patient's Alzheimer's now with behavioral disturbances. Has continued to require PRN medications to help with agitation, including zyprexa  overnight.  - Ativan  0.5 mg q6h PRN for anxiety - Seroquel  200 mg at bedtime  - Zoloft 75 mg daily  - Can use Zyprexa  5 mg IM if needed for agitation - Continue soft wrist restraints and soft waist belt constraints  - Pending discharge to SNF, awaiting placement   Syncope Had reported syncopal event at SNF. Per above workup negative.   BPH Home finasteride  5 daily. No urinary retention seen thus far on bladder scans.     Best Practice: Diet: DYS IVF: Fluids: none VTE: enoxaparin  (LOVENOX ) injection 40 mg Start: 09/17/23 2200 Code: DNR/DNI AB: None Therapy Recs: Return to memory care with PT Family Contact: son/wife, to be notified. DISPO:  Anticipated discharge to SNF pending  placement .  Signature: Carleen Chary, D.O.  Internal Medicine Resident, PGY-1 [name]  Pager: [phone] 10:28 AM, 09/21/2023   Please contact the on call pager after 5 pm and on weekends at [phone].

## 2023-09-22 DIAGNOSIS — R293 Abnormal posture: Secondary | ICD-10-CM | POA: Diagnosis not present

## 2023-09-22 DIAGNOSIS — G9349 Other encephalopathy: Secondary | ICD-10-CM | POA: Diagnosis not present

## 2023-09-27 DIAGNOSIS — R2681 Unsteadiness on feet: Secondary | ICD-10-CM | POA: Diagnosis not present

## 2023-09-27 DIAGNOSIS — R488 Other symbolic dysfunctions: Secondary | ICD-10-CM | POA: Diagnosis not present

## 2023-10-04 DIAGNOSIS — F411 Generalized anxiety disorder: Secondary | ICD-10-CM | POA: Diagnosis not present

## 2023-10-04 DIAGNOSIS — F01518 Vascular dementia, unspecified severity, with other behavioral disturbance: Secondary | ICD-10-CM | POA: Diagnosis not present

## 2023-10-04 DIAGNOSIS — F331 Major depressive disorder, recurrent, moderate: Secondary | ICD-10-CM | POA: Diagnosis not present

## 2023-10-04 DIAGNOSIS — F5105 Insomnia due to other mental disorder: Secondary | ICD-10-CM | POA: Diagnosis not present

## 2023-10-13 DIAGNOSIS — R1312 Dysphagia, oropharyngeal phase: Secondary | ICD-10-CM | POA: Diagnosis not present

## 2023-10-13 DIAGNOSIS — R4789 Other speech disturbances: Secondary | ICD-10-CM | POA: Diagnosis not present

## 2023-10-13 DIAGNOSIS — R4185 Anosognosia: Secondary | ICD-10-CM | POA: Diagnosis not present

## 2023-10-13 DIAGNOSIS — R1310 Dysphagia, unspecified: Secondary | ICD-10-CM | POA: Diagnosis not present

## 2023-10-15 DIAGNOSIS — F339 Major depressive disorder, recurrent, unspecified: Secondary | ICD-10-CM | POA: Diagnosis not present

## 2023-10-15 DIAGNOSIS — F99 Mental disorder, not otherwise specified: Secondary | ICD-10-CM | POA: Diagnosis not present

## 2023-10-18 DIAGNOSIS — F01518 Vascular dementia, unspecified severity, with other behavioral disturbance: Secondary | ICD-10-CM | POA: Diagnosis not present

## 2023-10-18 DIAGNOSIS — F5105 Insomnia due to other mental disorder: Secondary | ICD-10-CM | POA: Diagnosis not present

## 2023-10-18 DIAGNOSIS — F331 Major depressive disorder, recurrent, moderate: Secondary | ICD-10-CM | POA: Diagnosis not present

## 2023-10-18 DIAGNOSIS — F411 Generalized anxiety disorder: Secondary | ICD-10-CM | POA: Diagnosis not present

## 2023-10-26 ENCOUNTER — Ambulatory Visit: Payer: Medicare Other | Admitting: Physician Assistant

## 2023-11-01 DIAGNOSIS — F331 Major depressive disorder, recurrent, moderate: Secondary | ICD-10-CM | POA: Diagnosis not present

## 2023-11-01 DIAGNOSIS — F5105 Insomnia due to other mental disorder: Secondary | ICD-10-CM | POA: Diagnosis not present

## 2023-11-01 DIAGNOSIS — F411 Generalized anxiety disorder: Secondary | ICD-10-CM | POA: Diagnosis not present

## 2023-11-01 DIAGNOSIS — F0150 Vascular dementia without behavioral disturbance: Secondary | ICD-10-CM | POA: Diagnosis not present

## 2023-11-24 DIAGNOSIS — R4182 Altered mental status, unspecified: Secondary | ICD-10-CM | POA: Diagnosis not present

## 2023-11-24 DIAGNOSIS — G9340 Encephalopathy, unspecified: Secondary | ICD-10-CM | POA: Diagnosis not present

## 2023-11-24 DIAGNOSIS — K5904 Chronic idiopathic constipation: Secondary | ICD-10-CM | POA: Diagnosis not present

## 2023-11-24 DIAGNOSIS — K219 Gastro-esophageal reflux disease without esophagitis: Secondary | ICD-10-CM | POA: Diagnosis not present

## 2023-11-29 DIAGNOSIS — F5105 Insomnia due to other mental disorder: Secondary | ICD-10-CM | POA: Diagnosis not present

## 2023-11-29 DIAGNOSIS — F411 Generalized anxiety disorder: Secondary | ICD-10-CM | POA: Diagnosis not present

## 2023-11-29 DIAGNOSIS — F0150 Vascular dementia without behavioral disturbance: Secondary | ICD-10-CM | POA: Diagnosis not present

## 2023-11-29 DIAGNOSIS — F331 Major depressive disorder, recurrent, moderate: Secondary | ICD-10-CM | POA: Diagnosis not present

## 2023-12-07 DIAGNOSIS — N1831 Chronic kidney disease, stage 3a: Secondary | ICD-10-CM | POA: Diagnosis not present

## 2023-12-07 DIAGNOSIS — E559 Vitamin D deficiency, unspecified: Secondary | ICD-10-CM | POA: Diagnosis not present

## 2023-12-07 DIAGNOSIS — K219 Gastro-esophageal reflux disease without esophagitis: Secondary | ICD-10-CM | POA: Diagnosis not present

## 2023-12-13 DIAGNOSIS — F5105 Insomnia due to other mental disorder: Secondary | ICD-10-CM | POA: Diagnosis not present

## 2023-12-13 DIAGNOSIS — F01C18 Vascular dementia, severe, with other behavioral disturbance: Secondary | ICD-10-CM | POA: Diagnosis not present

## 2023-12-13 DIAGNOSIS — F411 Generalized anxiety disorder: Secondary | ICD-10-CM | POA: Diagnosis not present

## 2023-12-13 DIAGNOSIS — F331 Major depressive disorder, recurrent, moderate: Secondary | ICD-10-CM | POA: Diagnosis not present

## 2023-12-14 DIAGNOSIS — E785 Hyperlipidemia, unspecified: Secondary | ICD-10-CM | POA: Diagnosis not present

## 2023-12-14 DIAGNOSIS — F411 Generalized anxiety disorder: Secondary | ICD-10-CM | POA: Diagnosis not present

## 2023-12-20 DIAGNOSIS — F331 Major depressive disorder, recurrent, moderate: Secondary | ICD-10-CM | POA: Diagnosis not present

## 2023-12-20 DIAGNOSIS — F411 Generalized anxiety disorder: Secondary | ICD-10-CM | POA: Diagnosis not present

## 2023-12-20 DIAGNOSIS — F5105 Insomnia due to other mental disorder: Secondary | ICD-10-CM | POA: Diagnosis not present

## 2023-12-20 DIAGNOSIS — F01C18 Vascular dementia, severe, with other behavioral disturbance: Secondary | ICD-10-CM | POA: Diagnosis not present

## 2024-01-11 DIAGNOSIS — F32A Depression, unspecified: Secondary | ICD-10-CM | POA: Diagnosis not present

## 2024-01-11 DIAGNOSIS — N401 Enlarged prostate with lower urinary tract symptoms: Secondary | ICD-10-CM | POA: Diagnosis not present

## 2024-01-11 DIAGNOSIS — F03C4 Unspecified dementia, severe, with anxiety: Secondary | ICD-10-CM | POA: Diagnosis not present

## 2024-01-11 DIAGNOSIS — K5900 Constipation, unspecified: Secondary | ICD-10-CM | POA: Diagnosis not present

## 2024-01-19 DIAGNOSIS — Z23 Encounter for immunization: Secondary | ICD-10-CM | POA: Diagnosis not present

## 2024-01-24 DIAGNOSIS — F32A Depression, unspecified: Secondary | ICD-10-CM | POA: Diagnosis not present

## 2024-01-24 DIAGNOSIS — K5900 Constipation, unspecified: Secondary | ICD-10-CM | POA: Diagnosis not present

## 2024-01-24 DIAGNOSIS — F03C4 Unspecified dementia, severe, with anxiety: Secondary | ICD-10-CM | POA: Diagnosis not present

## 2024-01-24 DIAGNOSIS — N401 Enlarged prostate with lower urinary tract symptoms: Secondary | ICD-10-CM | POA: Diagnosis not present

## 2024-01-25 DIAGNOSIS — L821 Other seborrheic keratosis: Secondary | ICD-10-CM | POA: Diagnosis not present

## 2024-02-10 DIAGNOSIS — R21 Rash and other nonspecific skin eruption: Secondary | ICD-10-CM | POA: Diagnosis not present

## 2024-02-10 DIAGNOSIS — N401 Enlarged prostate with lower urinary tract symptoms: Secondary | ICD-10-CM | POA: Diagnosis not present

## 2024-02-10 DIAGNOSIS — F32A Depression, unspecified: Secondary | ICD-10-CM | POA: Diagnosis not present

## 2024-02-10 DIAGNOSIS — K5900 Constipation, unspecified: Secondary | ICD-10-CM | POA: Diagnosis not present

## 2024-03-20 DIAGNOSIS — K5900 Constipation, unspecified: Secondary | ICD-10-CM | POA: Diagnosis not present

## 2024-03-20 DIAGNOSIS — N401 Enlarged prostate with lower urinary tract symptoms: Secondary | ICD-10-CM | POA: Diagnosis not present

## 2024-03-20 DIAGNOSIS — S31809A Unspecified open wound of unspecified buttock, initial encounter: Secondary | ICD-10-CM | POA: Diagnosis not present

## 2024-03-20 DIAGNOSIS — F32A Depression, unspecified: Secondary | ICD-10-CM | POA: Diagnosis not present

## 2024-03-31 IMAGING — CT CT ABD-PELV W/ CM
2 of 5 series · 15 of 46 positions shown, 17 images · IV contrast (agent unspecified)
Comparison: None Available.

CLINICAL DATA: Abdominal pain, nausea/vomiting

EXAM:
CT ABDOMEN AND PELVIS WITH CONTRAST
TECHNIQUE: Multidetector CT imaging of the abdomen and pelvis was performed
using the standard protocol following bolus administration of
intravenous contrast.

[Series 2: axial st · axial · 0.73mm/px · z∈[-417,-22]mm · 12 of 93 slices shown, 14 images]
[im 7/93  soft-tissue]
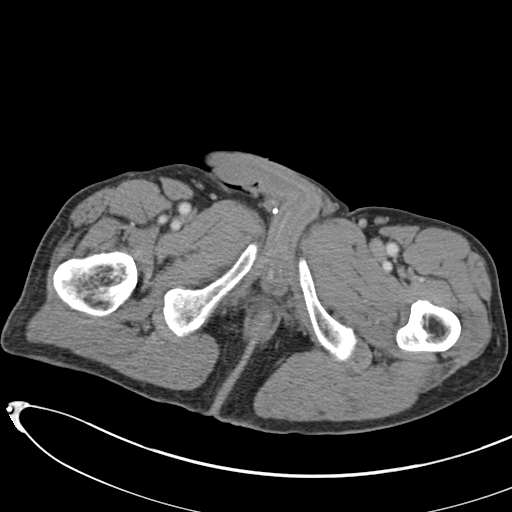
[im 7/93  bone]
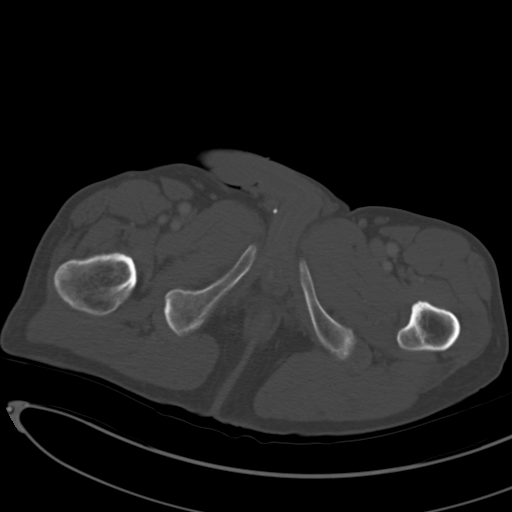
[im 13/93  soft-tissue]
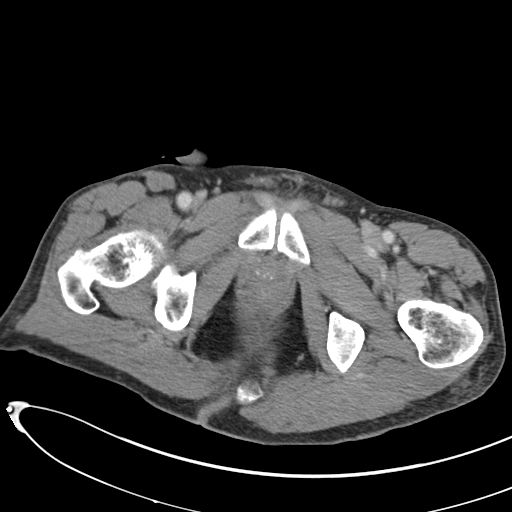
[im 19/93  soft-tissue]
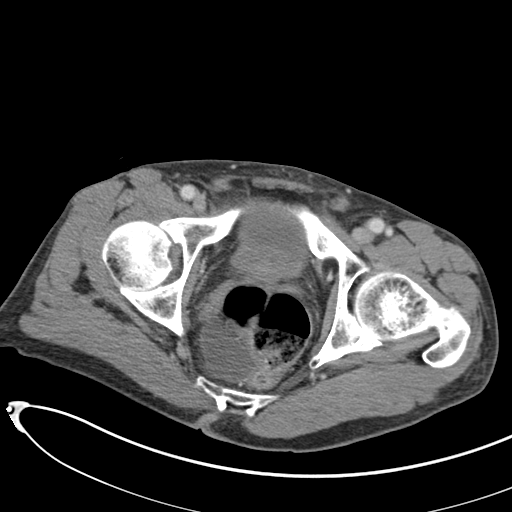
[im 31/93  soft-tissue]
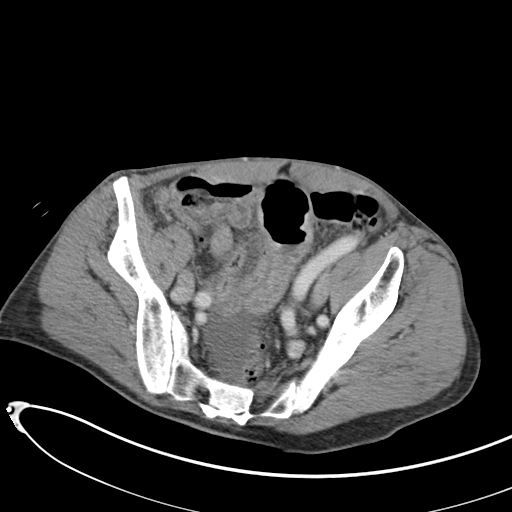
[im 37/93  soft-tissue]
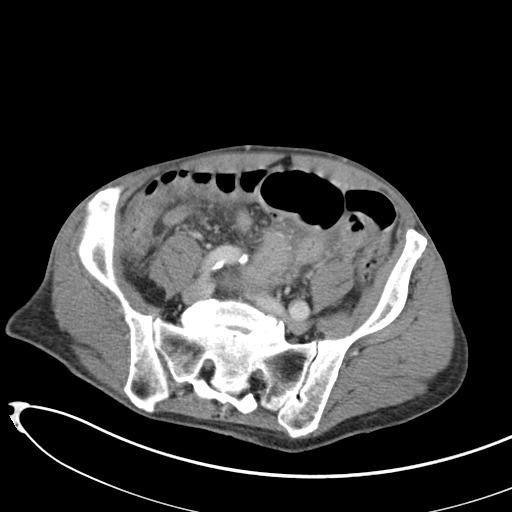
[im 43/93  soft-tissue]
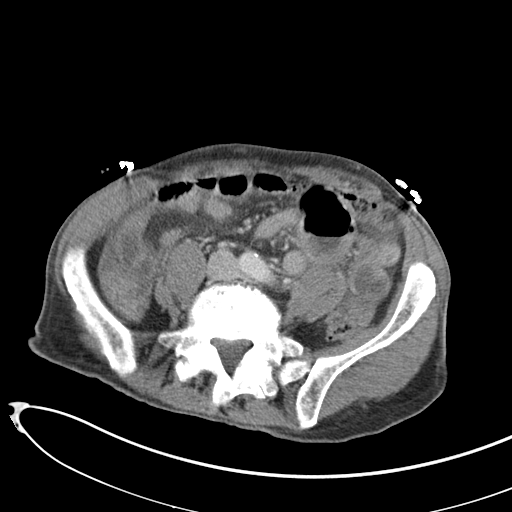
[im 50/93  soft-tissue]
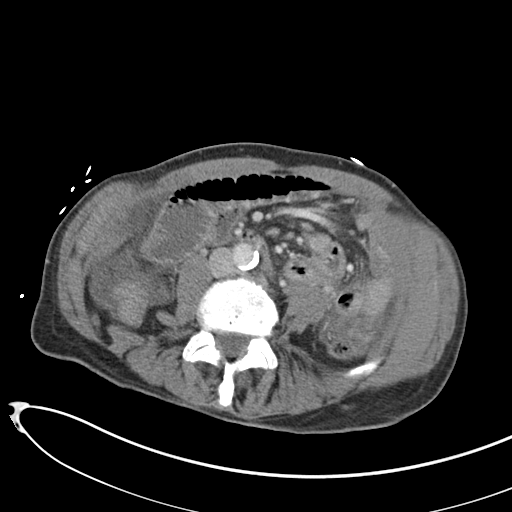
[im 56/93  soft-tissue]
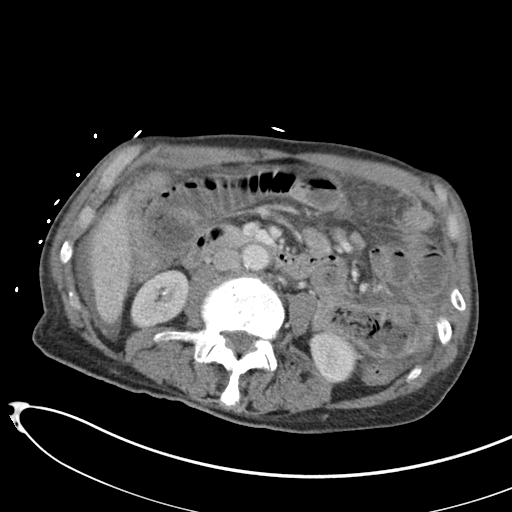
[im 62/93  soft-tissue]
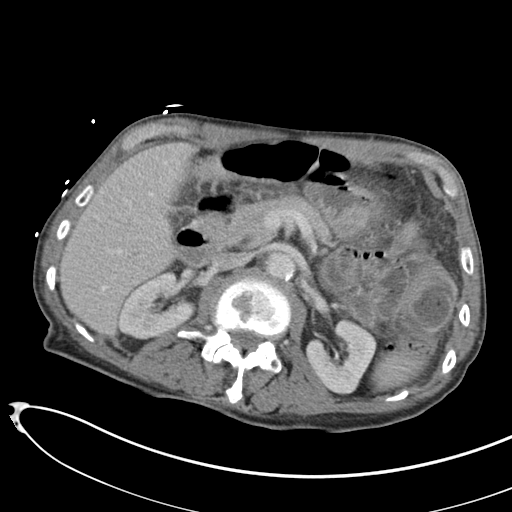
[im 62/93  bone]
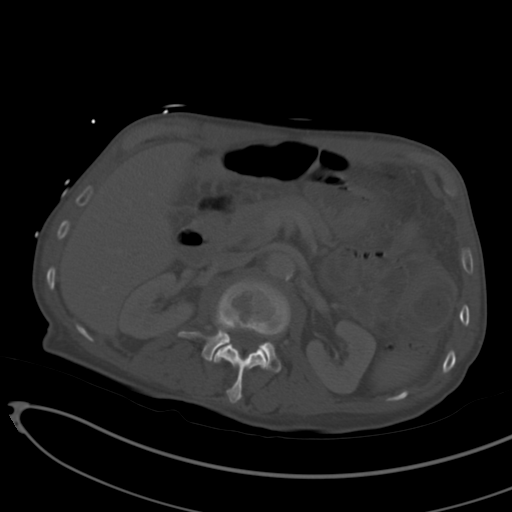
[im 74/93  soft-tissue]
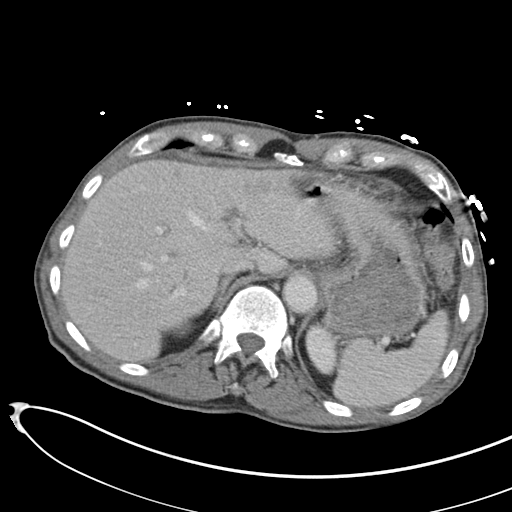
[im 80/93  soft-tissue]
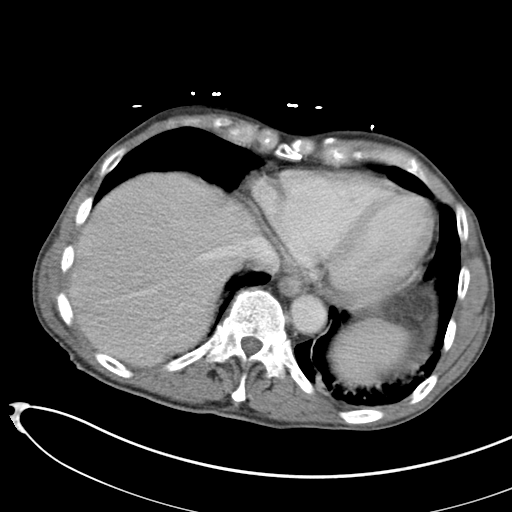
[im 86/93  soft-tissue]
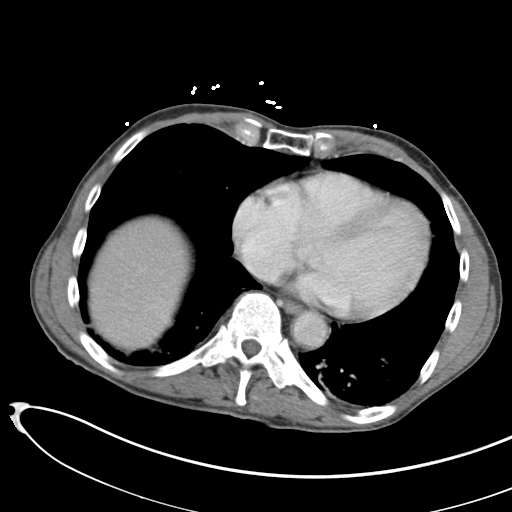

[Series 5: coronal st · coronal · 0.73mm/px · 3 of 130 slices shown]
[im 44/130  soft-tissue]
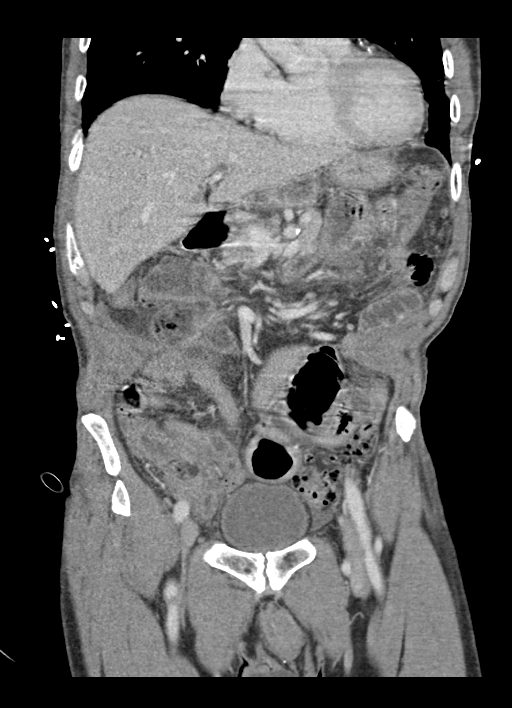
[im 58/130  soft-tissue]
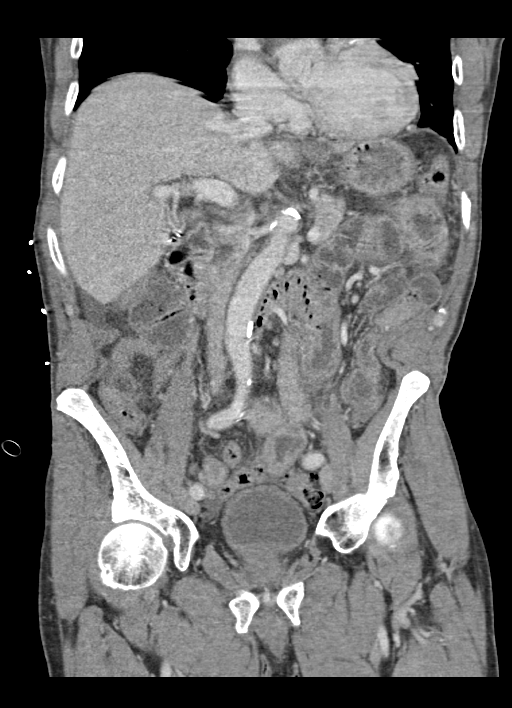
[im 72/130  soft-tissue]
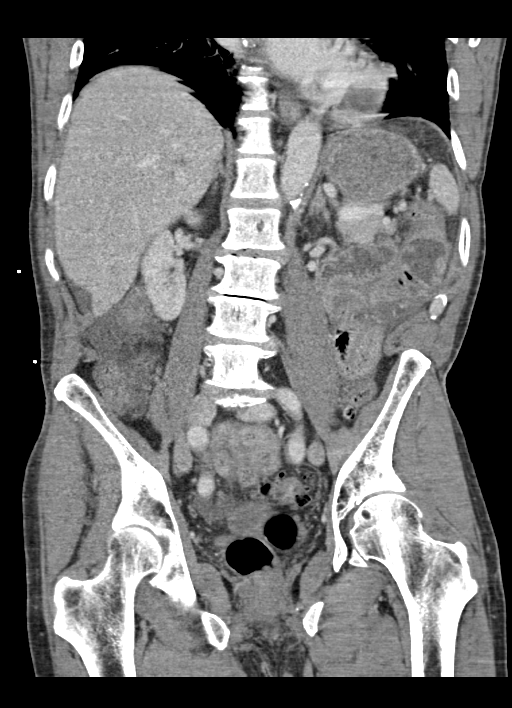

[15 of 46 positions shown; findings below may reference images not displayed]

RADIATION DOSE REDUCTION: This exam was performed according to the
departmental dose-optimization program which includes automated
exposure control, adjustment of the mA and/or kV according to
patient size and/or use of iterative reconstruction technique.

CONTRAST:  100mL OMNIPAQUE IOHEXOL 300 MG/ML  SOLN
FINDINGS: Lower chest: Mild bibasilar opacities, likely atelectasis.

Hepatobiliary: Liver is within normal limits.

Status post cholecystectomy. No intrahepatic or extrahepatic ductal
dilatation.

Pancreas: Within normal limits.

Spleen: Within normal limits.

Adrenals/Urinary Tract: Adrenal glands are within normal limits.

Subcentimeter right lower pole renal cyst (series 2/image 39).
Suspected faint excretory contrast in the bilateral renal collecting
systems. No hydronephrosis.

Thick-walled bladder, although underdistended.

Stomach/Bowel: Stomach is within normal limits.

Multiple dilated loops of small bowel in the left mid abdomen. Small
bowel stasis in the left mid abdomen (series 2/image 39). Associated
gradual transition/narrowing distally in the left mid abdomen
(series 2/image 40). Small bowel loops in the right lower quadrant
are decompressed (series 2/image 54). Overall, this appearance
raises concern for partial small bowel obstruction, less likely
small bowel enteritis.

Associated small bowel diverticulum in the left upper abdomen
(series 2/image 34), with mild wall thickening, but no convincing
inflammation to suggest small bowel diverticulitis.

Extensive sigmoid diverticulosis, without evidence of
diverticulitis.

Vascular/Lymphatic: No evidence of abdominal aortic aneurysm.

Atherosclerotic calcifications of the abdominal aorta and branch
vessels.

No suspicious abdominopelvic lymphadenopathy.

Reproductive: Prostate is unremarkable.

Other: Small volume pelvic ascites.

No pneumatosis or free air.

Musculoskeletal: Mild degenerative changes of the lumbar spine.
IMPRESSION: Multiple dilated loops of small bowel in the left mid abdomen with
associated gradual transition/narrowing distally. Overall, this
appearance raises concern for partial small bowel obstruction, less
likely small bowel enteritis.

Associated small bowel diverticulum in the left anterior abdomen,
without convincing small bowel diverticulitis.

Small volume pelvic ascites. No pneumatosis or free air.

## 2024-04-01 IMAGING — DX DG ABD PORTABLE 1V
1 series · 1 of 1 positions shown · non-contrast
Comparison: 10/09/2021.

CLINICAL DATA: 8 hour delay film, NG tube placement.

EXAM:
PORTABLE ABDOMEN - 1 VIEW

[abdomen kub]
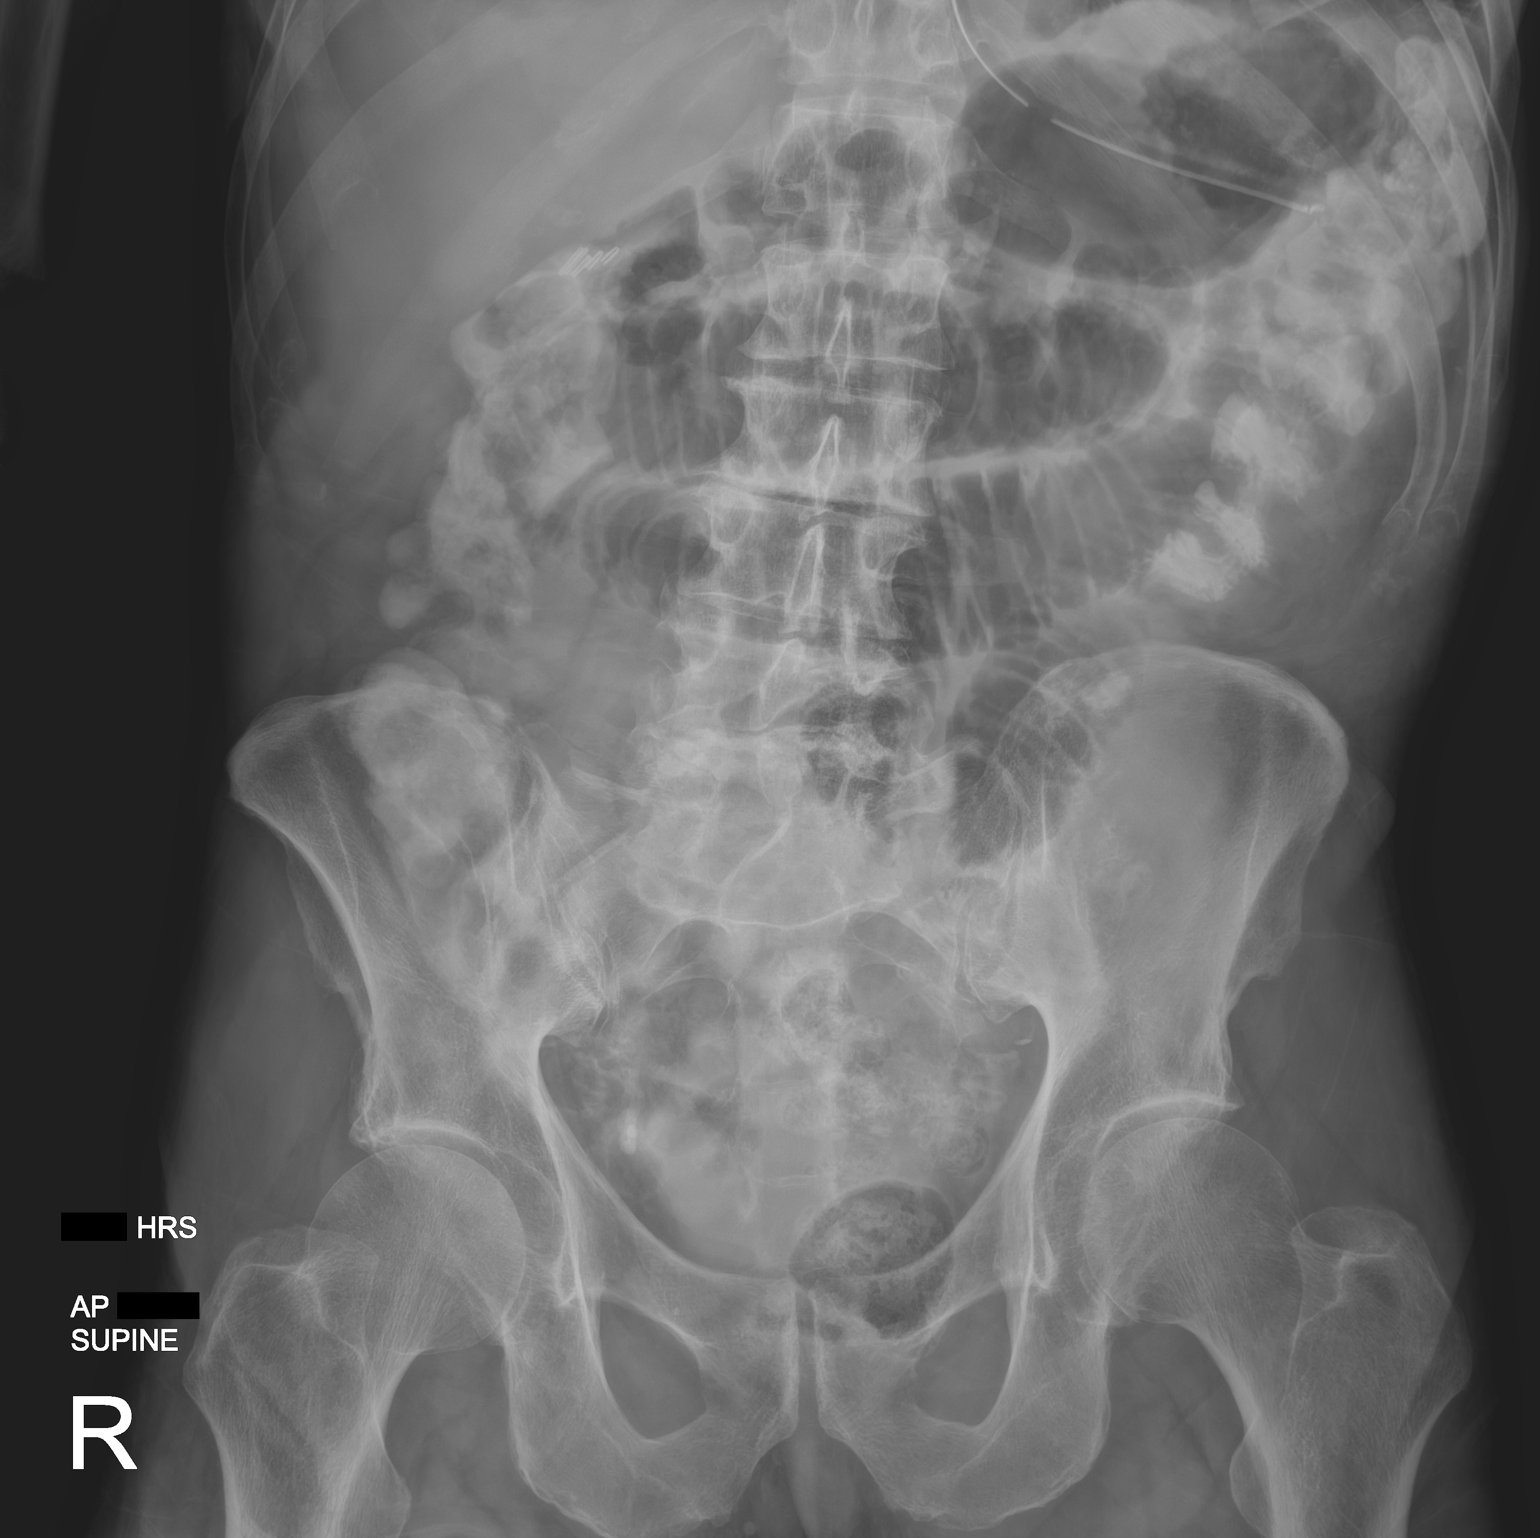

[1 of 1 positions shown; findings below may reference images not displayed]

FINDINGS: There is redemonstration of multiple gas-filled dilated loops of
small bowel in the abdomen measuring up to 5.2 cm in diameter.
Contrast is present in the colon on delayed imaging. Air is seen in
the rectum. An enteric tube terminates in the stomach. No acute
osseous abnormality is identified.
IMPRESSION: Persistent dilatation of multiple loops of small bowel in the
abdomen measuring up to 5.2 cm. Contrast is identified in the colon
on delayed imaging suggesting ileus versus partial obstruction.

## 2024-04-01 IMAGING — DX DG ABDOMEN 1V
1 series · 2 of 2 positions shown · non-contrast
Comparison: CT abdomen pelvis, 10/08/2021

CLINICAL DATA: Small bowel obstruction

EXAM:
ABDOMEN - 1 VIEW

[Series 1: abdomen kub · 0.14mm/px · 2 of 2 slices shown]
[im 1/2]
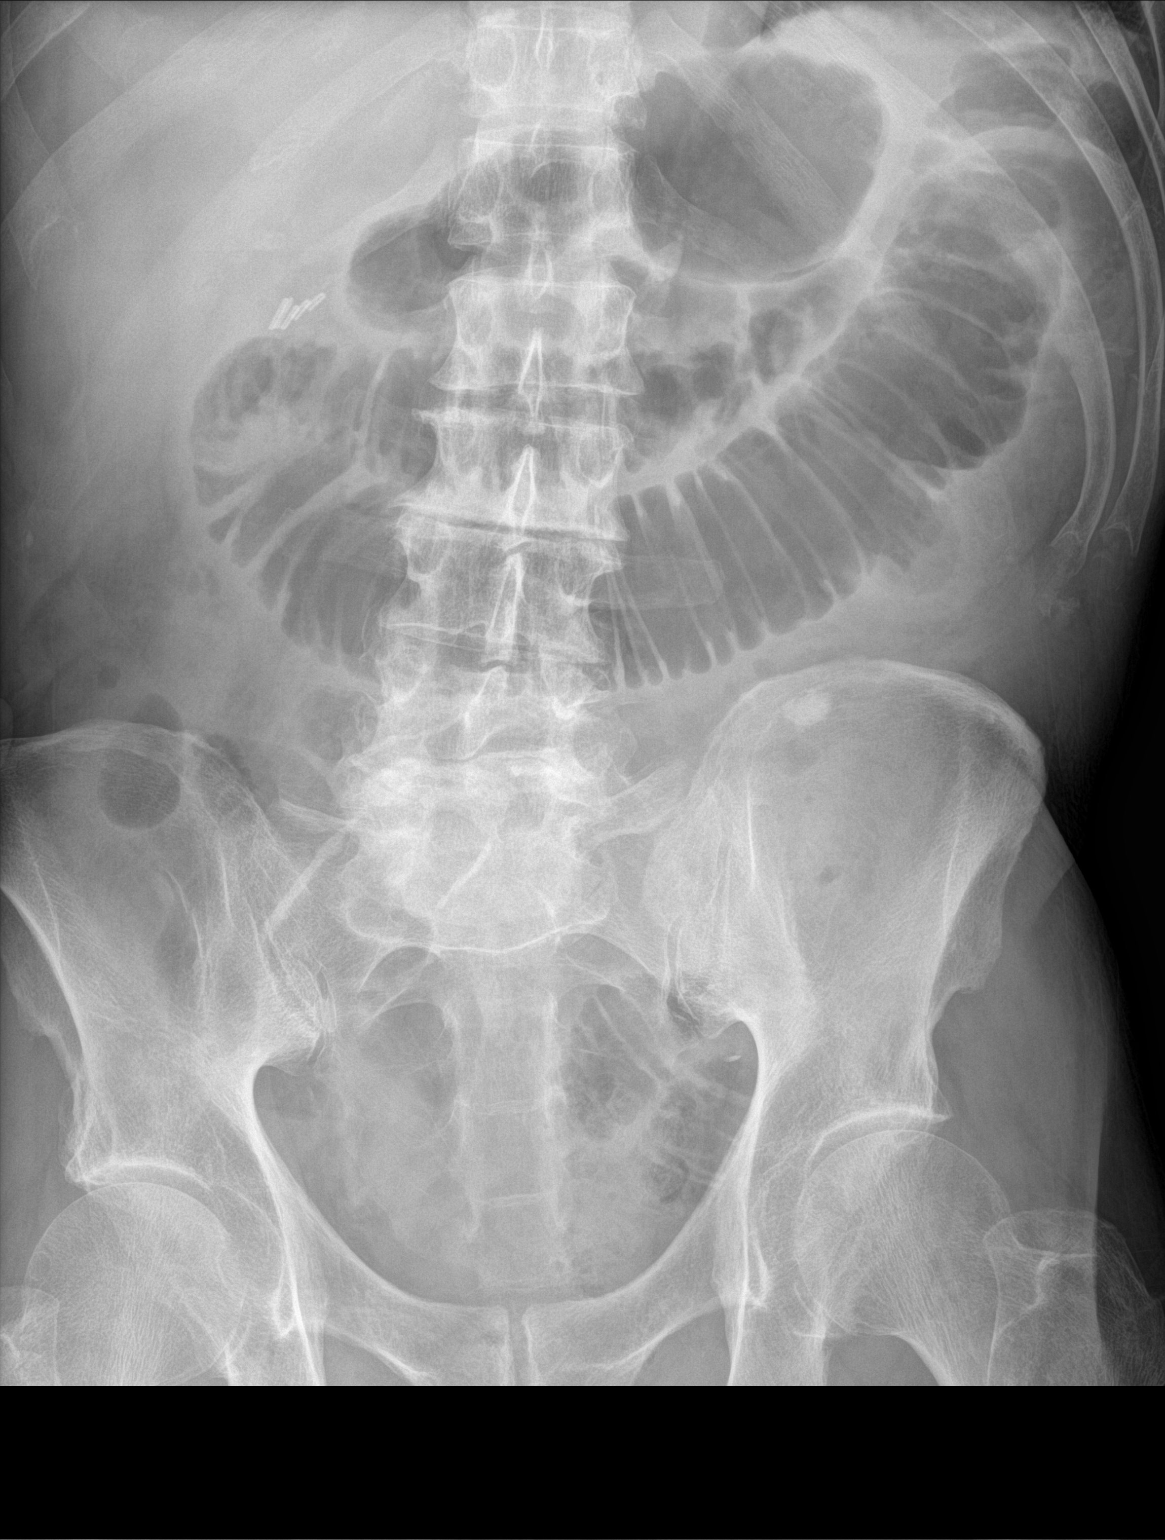
[im 2/2]
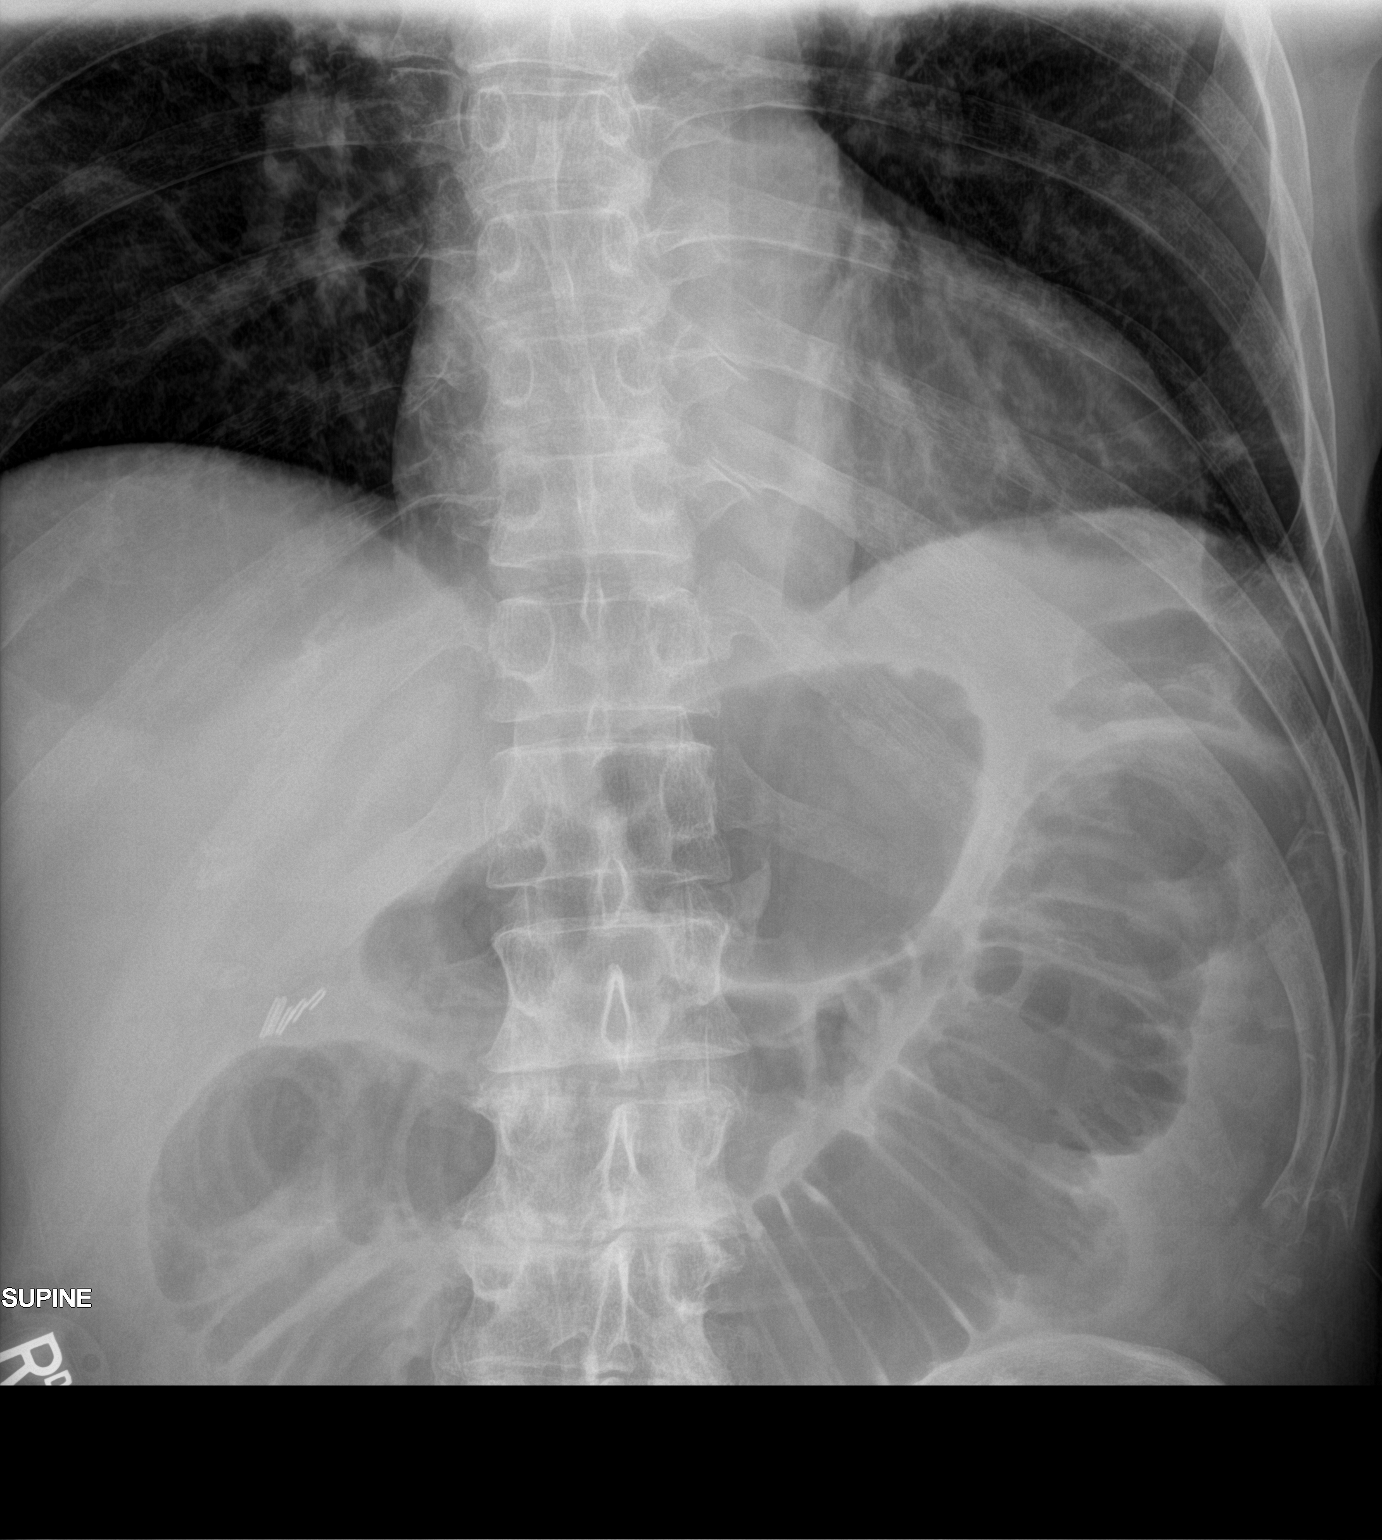

[2 of 2 positions shown; findings below may reference images not displayed]

FINDINGS: Gas distended loops of small bowel in the central abdomen measuring
up to 5.6 cm in caliber. Little if any colonic gas appreciated. No
large burden of stool. No obvious free air on supine radiographs. No
radio-opaque calculi or other significant radiographic abnormality
are seen.
IMPRESSION: Gas distended loops of small bowel in the central abdomen measuring
up to 5.6 cm in caliber. Little if any colonic gas appreciated.
Findings are consistent with small bowel obstruction.

## 2024-04-01 IMAGING — DX DG ABD PORTABLE 1V
1 series · 1 of 1 positions shown · non-contrast
Comparison: 10/09/2021, [DATE] a.m.

CLINICAL DATA: NG tube placement

EXAM:
PORTABLE ABDOMEN - 1 VIEW

[abdomen kub]
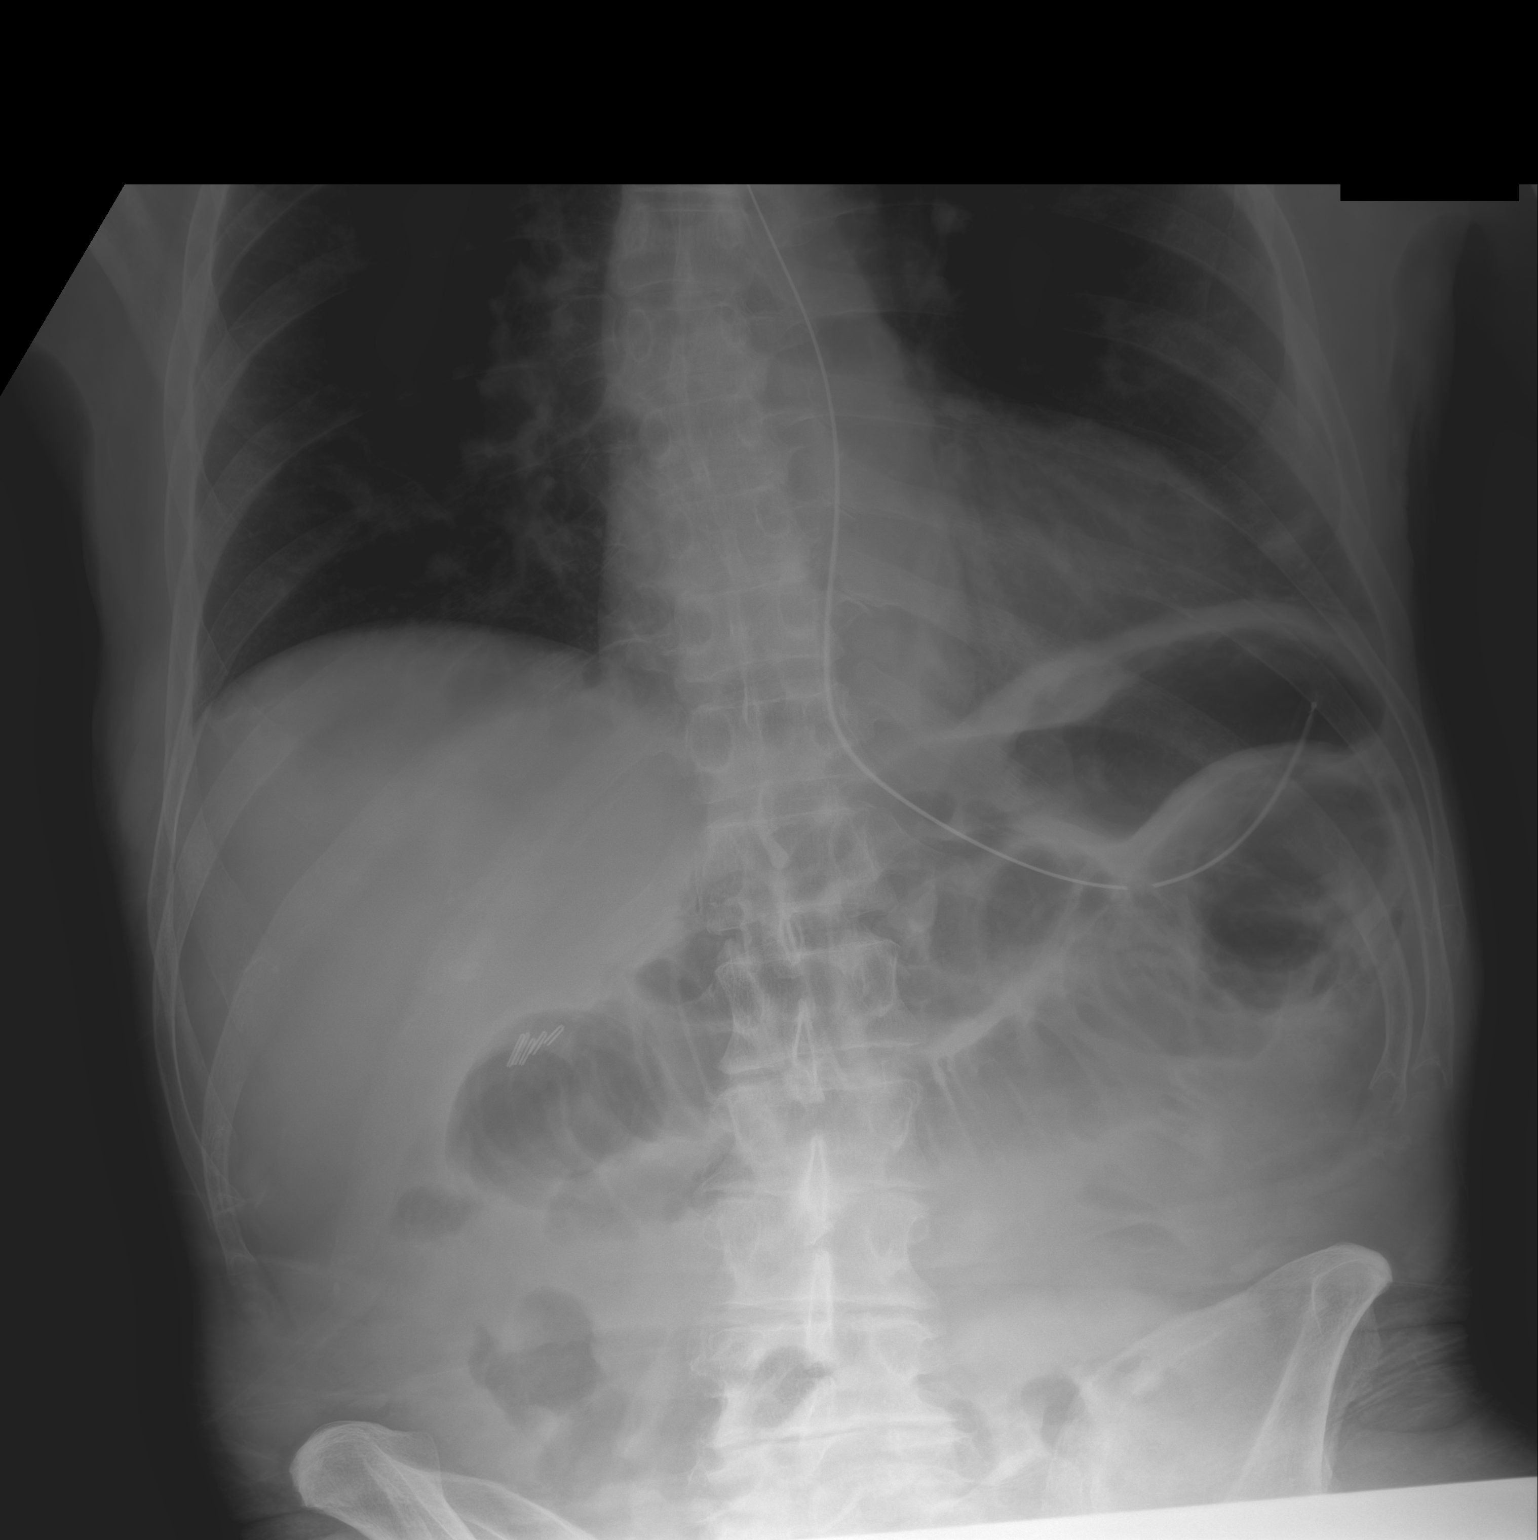

[1 of 1 positions shown; findings below may reference images not displayed]

FINDINGS: Interval placement of esophagogastric tube, tip and side port below
the diaphragm. Distention of small-bowel is unchanged at
approximately 5.1 cm caliber. No free air on erect radiographs.
IMPRESSION: 1. Interval placement of esophagogastric tube, tip and side port
below the diaphragm.

2. Distention of small-bowel is unchanged, consistent with small
bowel obstruction. No free air on erect radiographs.

## 2024-04-03 IMAGING — DX DG ABD PORTABLE 1V
1 series · 1 of 1 positions shown · non-contrast
Comparison: Radiographs dated October 09, 2021

CLINICAL DATA: Status post NG tube placement.

EXAM:
PORTABLE ABDOMEN - 1 VIEW

[abdomen kub]
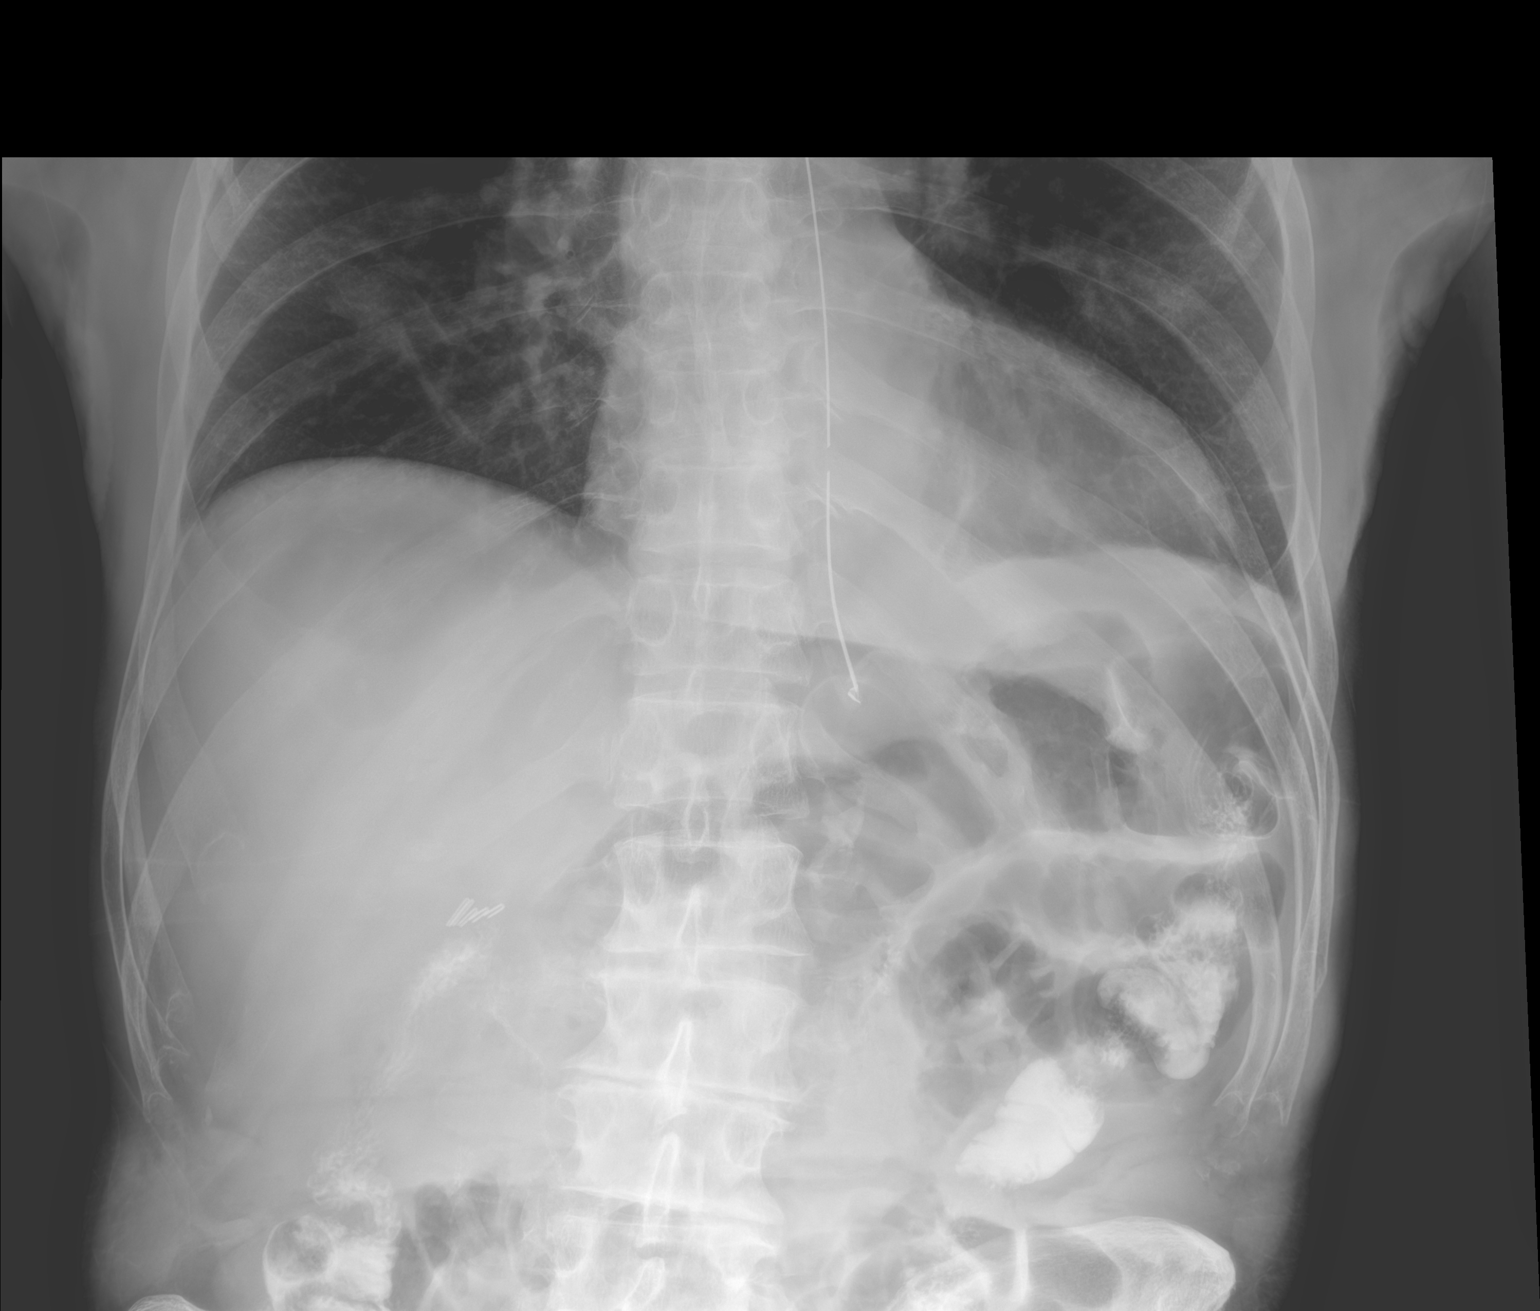

[1 of 1 positions shown; findings below may reference images not displayed]

FINDINGS: NG tube with side port in the distal esophagus and distal tip just
below the GE junction, it could be advanced 7-8 cm.

Partially imaged abdomen demonstrate contrast fills small bowel
loops. Abdomen not completely imaged. Cholecystectomy clips are
noted. No acute osseous abnormality.
IMPRESSION: NG tube with side port in the distal esophagus and tip just below
the GE junction, it could be advanced 7-8 cm.

## 2024-04-17 DIAGNOSIS — F32A Depression, unspecified: Secondary | ICD-10-CM | POA: Diagnosis not present

## 2024-04-17 DIAGNOSIS — G4700 Insomnia, unspecified: Secondary | ICD-10-CM | POA: Diagnosis not present

## 2024-04-17 DIAGNOSIS — N401 Enlarged prostate with lower urinary tract symptoms: Secondary | ICD-10-CM | POA: Diagnosis not present

## 2024-04-17 DIAGNOSIS — K5900 Constipation, unspecified: Secondary | ICD-10-CM | POA: Diagnosis not present
